# Patient Record
Sex: Male | Born: 1965 | Hispanic: Yes | Marital: Married | Smoking: Former smoker
Health system: Southern US, Community
[De-identification: ages and names within clinical notes are randomized; demographics above are authoritative.]

## PROBLEM LIST (undated history)

## (undated) ENCOUNTER — Emergency Department (HOSPITAL_COMMUNITY): Admission: EM | Payer: Self-pay | Source: Home / Self Care

## (undated) DIAGNOSIS — Z789 Other specified health status: Secondary | ICD-10-CM

## (undated) DIAGNOSIS — I639 Cerebral infarction, unspecified: Secondary | ICD-10-CM

## (undated) HISTORY — DX: Cerebral infarction, unspecified: I63.9

---

## 2016-03-11 ENCOUNTER — Other Ambulatory Visit (HOSPITAL_COMMUNITY): Payer: Self-pay | Admitting: Interventional Radiology

## 2016-03-11 ENCOUNTER — Inpatient Hospital Stay (HOSPITAL_COMMUNITY): Payer: Medicaid Other | Admitting: Certified Registered Nurse Anesthetist

## 2016-03-11 ENCOUNTER — Ambulatory Visit (HOSPITAL_COMMUNITY)
Admission: RE | Admit: 2016-03-11 | Discharge: 2016-03-11 | Disposition: A | Discharge status: Home / Self Care | Payer: Medicaid Other | Source: Ambulatory Visit | Attending: Interventional Radiology | Admitting: Interventional Radiology

## 2016-03-11 ENCOUNTER — Ambulatory Visit (HOSPITAL_COMMUNITY)
Admission: RE | Admit: 2016-03-11 | Discharge: 2016-03-11 | Disposition: A | Discharge status: Home / Self Care | Payer: Self-pay | Source: Ambulatory Visit | Attending: Interventional Radiology | Admitting: Interventional Radiology

## 2016-03-11 ENCOUNTER — Encounter (HOSPITAL_COMMUNITY)
Admission: EM | Disposition: A | Discharge status: Rehabilitation Facility | Payer: Self-pay | Source: Other Acute Inpatient Hospital | Attending: Neurology

## 2016-03-11 ENCOUNTER — Inpatient Hospital Stay (HOSPITAL_COMMUNITY)
Admission: EM | Admit: 2016-03-11 | Discharge: 2016-03-14 | DRG: 065 | Disposition: A | Discharge status: Rehabilitation Facility | Payer: Medicaid Other | Source: Other Acute Inpatient Hospital | Attending: Neurology | Admitting: Neurology

## 2016-03-11 ENCOUNTER — Inpatient Hospital Stay (HOSPITAL_COMMUNITY): Payer: Medicaid Other

## 2016-03-11 ENCOUNTER — Encounter (HOSPITAL_COMMUNITY): Payer: Self-pay | Admitting: Pulmonary Disease

## 2016-03-11 DIAGNOSIS — I6389 Other cerebral infarction: Secondary | ICD-10-CM

## 2016-03-11 DIAGNOSIS — I639 Cerebral infarction, unspecified: Secondary | ICD-10-CM

## 2016-03-11 DIAGNOSIS — I10 Essential (primary) hypertension: Secondary | ICD-10-CM | POA: Diagnosis present

## 2016-03-11 DIAGNOSIS — F17200 Nicotine dependence, unspecified, uncomplicated: Secondary | ICD-10-CM

## 2016-03-11 DIAGNOSIS — G8194 Hemiplegia, unspecified affecting left nondominant side: Secondary | ICD-10-CM | POA: Diagnosis not present

## 2016-03-11 DIAGNOSIS — I638 Other cerebral infarction: Secondary | ICD-10-CM

## 2016-03-11 DIAGNOSIS — J9690 Respiratory failure, unspecified, unspecified whether with hypoxia or hypercapnia: Secondary | ICD-10-CM

## 2016-03-11 DIAGNOSIS — E785 Hyperlipidemia, unspecified: Secondary | ICD-10-CM

## 2016-03-11 DIAGNOSIS — I63311 Cerebral infarction due to thrombosis of right middle cerebral artery: Secondary | ICD-10-CM

## 2016-03-11 DIAGNOSIS — R29707 NIHSS score 7: Secondary | ICD-10-CM | POA: Diagnosis present

## 2016-03-11 DIAGNOSIS — R1310 Dysphagia, unspecified: Secondary | ICD-10-CM | POA: Diagnosis present

## 2016-03-11 DIAGNOSIS — R739 Hyperglycemia, unspecified: Secondary | ICD-10-CM | POA: Diagnosis present

## 2016-03-11 DIAGNOSIS — J9601 Acute respiratory failure with hypoxia: Secondary | ICD-10-CM

## 2016-03-11 DIAGNOSIS — I63511 Cerebral infarction due to unspecified occlusion or stenosis of right middle cerebral artery: Principal | ICD-10-CM | POA: Diagnosis present

## 2016-03-11 DIAGNOSIS — H02402 Unspecified ptosis of left eyelid: Secondary | ICD-10-CM | POA: Diagnosis present

## 2016-03-11 DIAGNOSIS — F17210 Nicotine dependence, cigarettes, uncomplicated: Secondary | ICD-10-CM | POA: Diagnosis not present

## 2016-03-11 DIAGNOSIS — G4733 Obstructive sleep apnea (adult) (pediatric): Secondary | ICD-10-CM | POA: Diagnosis present

## 2016-03-11 DIAGNOSIS — R29810 Facial weakness: Secondary | ICD-10-CM | POA: Diagnosis present

## 2016-03-11 HISTORY — PX: RADIOLOGY WITH ANESTHESIA: SHX6223

## 2016-03-11 HISTORY — DX: Other specified health status: Z78.9

## 2016-03-11 HISTORY — PX: IR GENERIC HISTORICAL: IMG1180011

## 2016-03-11 LAB — BLOOD GAS, ARTERIAL
Acid-base deficit: 0.8 mmol/L (ref 0.0–2.0)
BICARBONATE: 23.1 mmol/L (ref 20.0–28.0)
DRAWN BY: 448981
FIO2: 1
LHR: 14 {breaths}/min
MECHVT: 510 mL
O2 Saturation: 99.8 %
PEEP: 5 cmH2O
PO2 ART: 394 mmHg — AB (ref 83.0–108.0)
Patient temperature: 97.8
pCO2 arterial: 35.7 mmHg (ref 32.0–48.0)
pH, Arterial: 7.424 (ref 7.350–7.450)

## 2016-03-11 LAB — BASIC METABOLIC PANEL
ANION GAP: 9 (ref 5–15)
Anion gap: 10 (ref 5–15)
BUN: 10 mg/dL (ref 6–20)
BUN: 9 mg/dL (ref 6–20)
CALCIUM: 8.8 mg/dL — AB (ref 8.9–10.3)
CHLORIDE: 104 mmol/L (ref 101–111)
CO2: 24 mmol/L (ref 22–32)
CO2: 23 mmol/L (ref 22–32)
CREATININE: 0.64 mg/dL (ref 0.61–1.24)
Calcium: 9.2 mg/dL (ref 8.9–10.3)
Chloride: 109 mmol/L (ref 101–111)
Creatinine, Ser: 0.67 mg/dL (ref 0.61–1.24)
GFR calc Af Amer: >60 mL/min (ref >60)
Glucose, Bld: 120 mg/dL — ABNORMAL HIGH (ref 65–99)
Glucose, Bld: 151 mg/dL — ABNORMAL HIGH (ref 65–99)
POTASSIUM: 3.8 mmol/L (ref 3.5–5.1)
POTASSIUM: 3.8 mmol/L (ref 3.5–5.1)
SODIUM: 138 mmol/L (ref 135–145)
SODIUM: 141 mmol/L (ref 135–145)

## 2016-03-11 LAB — CBC WITH DIFFERENTIAL/PLATELET
BASOS PCT: 0 %
Basophils Absolute: 0 10*3/uL (ref 0.0–0.1)
EOS ABS: 0.2 10*3/uL (ref 0.0–0.7)
EOS PCT: 2 %
HCT: 44.1 % (ref 39.0–52.0)
Hemoglobin: 14.8 g/dL (ref 13.0–17.0)
Lymphocytes Relative: 24 %
Lymphs Abs: 2.5 10*3/uL (ref 0.7–4.0)
MCH: 28.4 pg (ref 26.0–34.0)
MCHC: 33.6 g/dL (ref 30.0–36.0)
MCV: 84.6 fL (ref 78.0–100.0)
Monocytes Absolute: 0.4 10*3/uL (ref 0.1–1.0)
Monocytes Relative: 4 %
Neutro Abs: 7.3 10*3/uL (ref 1.7–7.7)
Neutrophils Relative %: 70 %
PLATELETS: 145 10*3/uL — AB (ref 150–400)
RBC: 5.21 MIL/uL (ref 4.22–5.81)
RDW: 13.3 % (ref 11.5–15.5)
WBC: 10.5 10*3/uL (ref 4.0–10.5)

## 2016-03-11 LAB — TRIGLYCERIDES: Triglycerides: 142 mg/dL (ref <150)

## 2016-03-11 LAB — PROTIME-INR
INR: 0.98
PROTHROMBIN TIME: 13 s (ref 11.4–15.2)

## 2016-03-11 LAB — GLUCOSE, CAPILLARY: Glucose-Capillary: 121 mg/dL — ABNORMAL HIGH (ref 65–99)

## 2016-03-11 LAB — MRSA PCR SCREENING: MRSA by PCR: NEGATIVE

## 2016-03-11 SURGERY — RADIOLOGY WITH ANESTHESIA
Anesthesia: General

## 2016-03-11 MED ORDER — PROPOFOL 10 MG/ML IV BOLUS
INTRAVENOUS | Status: DC | PRN
  Administered 2016-03-11: 120 mg via INTRAVENOUS

## 2016-03-11 MED ORDER — GLYCOPYRROLATE 0.2 MG/ML IJ SOLN
INTRAMUSCULAR | Status: DC | PRN
  Administered 2016-03-11: 0.2 mg via INTRAVENOUS

## 2016-03-11 MED ORDER — FAMOTIDINE IN NACL 20-0.9 MG/50ML-% IV SOLN
20.0000 mg | INTRAVENOUS | Status: DC
  Administered 2016-03-11 – 2016-03-12 (×2): 20 mg via INTRAVENOUS
  Filled 2016-03-11 (×2): qty 50

## 2016-03-11 MED ORDER — SUCCINYLCHOLINE CHLORIDE 20 MG/ML IJ SOLN
INTRAMUSCULAR | Status: DC | PRN
  Administered 2016-03-11: 100 mg via INTRAVENOUS

## 2016-03-11 MED ORDER — ROCURONIUM BROMIDE 100 MG/10ML IV SOLN
INTRAVENOUS | Status: DC | PRN
  Administered 2016-03-11: 10 mg via INTRAVENOUS
  Administered 2016-03-11 (×2): 20 mg via INTRAVENOUS
  Administered 2016-03-11: 50 mg via INTRAVENOUS
  Administered 2016-03-11: 30 mg via INTRAVENOUS

## 2016-03-11 MED ORDER — PROPOFOL 500 MG/50ML IV EMUL
INTRAVENOUS | Status: DC | PRN
  Administered 2016-03-11: 50 ug/kg/min via INTRAVENOUS

## 2016-03-11 MED ORDER — ACETAMINOPHEN 160 MG/5ML PO SOLN
650.0000 mg | ORAL | Status: DC | PRN
  Filled 2016-03-11: qty 20.3

## 2016-03-11 MED ORDER — PROPOFOL 1000 MG/100ML IV EMUL: 5.0000 ug/kg/min | INTRAVENOUS | Status: DC

## 2016-03-11 MED ORDER — CEFAZOLIN SODIUM-DEXTROSE 2-3 GM-% IV SOLR
INTRAVENOUS | Status: DC | PRN
  Administered 2016-03-11: 2 g via INTRAVENOUS

## 2016-03-11 MED ORDER — SODIUM CHLORIDE 0.9 % IV SOLN
INTRAVENOUS | Status: DC
  Administered 2016-03-11 – 2016-03-13 (×5): via INTRAVENOUS
  Administered 2016-03-14: 1000 mL via INTRAVENOUS

## 2016-03-11 MED ORDER — ACETAMINOPHEN 325 MG PO TABS
650.0000 mg | ORAL_TABLET | ORAL | Status: DC | PRN
  Filled 2016-03-11: qty 2

## 2016-03-11 MED ORDER — STROKE: EARLY STAGES OF RECOVERY BOOK
Freq: Once | Status: DC
  Filled 2016-03-11: qty 1

## 2016-03-11 MED ORDER — SODIUM CHLORIDE 0.9 % IV SOLN
0.0000 ug/min | INTRAVENOUS | Status: DC
  Filled 2016-03-11: qty 1

## 2016-03-11 MED ORDER — SODIUM CHLORIDE 0.9 % IV SOLN
INTRAVENOUS | Status: DC | PRN
  Administered 2016-03-11: 10:00:00 via INTRAVENOUS

## 2016-03-11 MED ORDER — ONDANSETRON HCL 4 MG/2ML IJ SOLN: 4.0000 mg | Freq: Four times a day (QID) | INTRAMUSCULAR | Status: DC | PRN

## 2016-03-11 MED ORDER — ORAL CARE MOUTH RINSE
15.0000 mL | Freq: Four times a day (QID) | OROMUCOSAL | Status: DC
Start: 2016-03-11 — End: 2016-03-11

## 2016-03-11 MED ORDER — ORAL CARE MOUTH RINSE
15.0000 mL | OROMUCOSAL | Status: DC
  Administered 2016-03-11: 15 mL via OROMUCOSAL

## 2016-03-11 MED ORDER — PROPOFOL 1000 MG/100ML IV EMUL
5.0000 ug/kg/min | INTRAVENOUS | Status: DC
  Administered 2016-03-11: 55 ug/kg/min via INTRAVENOUS

## 2016-03-11 MED ORDER — EPHEDRINE SULFATE 50 MG/ML IJ SOLN
INTRAMUSCULAR | Status: DC | PRN
  Administered 2016-03-11: 15 mg via INTRAVENOUS
  Administered 2016-03-11: 5 mg via INTRAVENOUS
  Administered 2016-03-11: 10 mg via INTRAVENOUS
  Administered 2016-03-11: 5 mg via INTRAVENOUS

## 2016-03-11 MED ORDER — NICARDIPINE HCL IN NACL 20-0.86 MG/200ML-% IV SOLN: 0.0000 mg/h | INTRAVENOUS | Status: DC

## 2016-03-11 MED ORDER — ACETAMINOPHEN 80 MG RE SUPP
650.0000 mg | RECTAL | Status: DC | PRN
  Filled 2016-03-11: qty 8

## 2016-03-11 MED ORDER — PHENYLEPHRINE HCL 10 MG/ML IJ SOLN
INTRAMUSCULAR | Status: DC | PRN
  Administered 2016-03-11 (×2): 120 ug via INTRAVENOUS

## 2016-03-11 MED ORDER — CHLORHEXIDINE GLUCONATE 0.12% ORAL RINSE (MEDLINE KIT): 15.0000 mL | Freq: Two times a day (BID) | OROMUCOSAL | Status: DC

## 2016-03-11 MED ORDER — DEXTROSE 5 % IV SOLN
INTRAVENOUS | Status: DC | PRN
  Administered 2016-03-11: 40 ug/min via INTRAVENOUS

## 2016-03-11 MED ORDER — FENTANYL CITRATE (PF) 100 MCG/2ML IJ SOLN
INTRAMUSCULAR | Status: DC | PRN
  Administered 2016-03-11 (×4): 50 ug via INTRAVENOUS

## 2016-03-11 MED ORDER — FENTANYL CITRATE (PF) 100 MCG/2ML IJ SOLN
INTRAMUSCULAR | Status: AC
Start: 2016-03-11 — End: 2016-03-11
  Filled 2016-03-11: qty 4

## 2016-03-11 MED ORDER — LIDOCAINE HCL (CARDIAC) 20 MG/ML IV SOLN
INTRAVENOUS | Status: DC | PRN
  Administered 2016-03-11: 60 mg via INTRAVENOUS

## 2016-03-11 MED ORDER — FENTANYL CITRATE (PF) 100 MCG/2ML IJ SOLN
INTRAMUSCULAR | Status: AC
  Filled 2016-03-11: qty 2

## 2016-03-11 MED ORDER — NICARDIPINE HCL IN NACL 20-0.86 MG/200ML-% IV SOLN
3.0000 mg/h | INTRAVENOUS | Status: DC
  Filled 2016-03-11: qty 200

## 2016-03-11 MED ORDER — IOPAMIDOL (ISOVUE-300) INJECTION 61%
INTRAVENOUS | Status: AC
  Filled 2016-03-11: qty 150

## 2016-03-11 MED ORDER — IPRATROPIUM-ALBUTEROL 0.5-2.5 (3) MG/3ML IN SOLN: 3.0000 mL | RESPIRATORY_TRACT | Status: DC | PRN

## 2016-03-11 MED ORDER — IOPAMIDOL (ISOVUE-300) INJECTION 61%
INTRAVENOUS | Status: AC
  Filled 2016-03-11: qty 50

## 2016-03-11 MED ORDER — CEFAZOLIN SODIUM-DEXTROSE 2-4 GM/100ML-% IV SOLN
INTRAVENOUS | Status: AC
  Filled 2016-03-11: qty 100

## 2016-03-11 NOTE — Progress Notes (Signed)
Pt at RN station talking with MD Loreta AveWagner about proceeding with stroke intervention, interpreter explains to pt via MD info and pt agrees to proceed; family not here at this time

## 2016-03-11 NOTE — Progress Notes (Signed)
Pt taken to PACU awaiting Neuro ICU bed. RN checked groin site and pulses with IR RN; groin level 0.; pulses strong; IR team signing off.

## 2016-03-11 NOTE — Progress Notes (Signed)
Pt not opening eyes or moving left side. CCM and neurology nofified.

## 2016-03-11 NOTE — H&P (Signed)
H&P    Chief Complaint: stroke  History obtained from:   Chart    HPI:                                                                                                                                         Grant Garcia is an 51 y.o. male who is Spanish-speaking only.  Hx obtained from staff as no family present.  Went to bed feeling weak all over but no focal findings.  Woke up this morning and noticed   Date last known well: Date: 03/10/2016 Time last known well: Time: 22:00 tPA Given: No: out of window    No past medical history on file.  No past surgical history on file.  No family history on file. Social History:  has no tobacco, alcohol, and drug history on file.  Allergies: Allergies not on file  Medications:                                                                                                                           Current Facility-Administered Medications  Medication Dose Route Frequency Provider Last Rate Last Dose  .  stroke: mapping our early stages of recovery book   Does not apply Once Ulice Dash, PA-C      . 0.9 %  sodium chloride infusion   Intravenous Continuous Ulice Dash, PA-C      . acetaminophen (TYLENOL) tablet 650 mg  650 mg Oral Q4H PRN Ulice Dash, PA-C       Or  . acetaminophen (TYLENOL) solution 650 mg  650 mg Per Tube Q4H PRN Ulice Dash, PA-C       Or  . acetaminophen (TYLENOL) suppository 640 mg  640 mg Rectal Q4H PRN Ulice Dash, PA-C       No current outpatient prescriptions on file.   Facility-Administered Medications Ordered in Other Encounters  Medication Dose Route Frequency Provider Last Rate Last Dose  . fentaNYL (SUBLIMAZE) 100 MCG/2ML injection           . iopamidol (ISOVUE-300) 61 % injection              ROS:  History could not be obtained from the patient due to  language barrier   Neurologic Examination:                                                                                                      There were no vitals taken for this visit.  HEENT-  Normocephalic, no lesions, without obvious abnormality.  Normal external eye and conjunctiva.  Normal TM's bilaterally.  Normal auditory canals and external ears. Normal external nose, mucus membranes and septum.  Normal pharynx. Cardiovascular- S1, S2 normal, pulses palpable throughout   Lungs- chest clear, no wheezing, rales, normal symmetric air entry Abdomen- normal findings: bowel sounds normal Extremities- no edema Lymph-no adenopathy palpable Musculoskeletal-no joint tenderness, deformity or swelling Skin-warm and dry, no hyperpigmentation, vitiligo, or suspicious lesions  Neurological Examination Mental Status: Alert, conversant through interpreter, follows commands  Cranial Nerves: II: Discs flat bilaterally; Visual fields grossly normal,  III,IV, VI: ptosis not present, extra-ocular motions intact bilaterally, pupils equal, round, reactive to light and accommodation V,VII: smile symmetric, facial light touch sensation normal bilaterally VIII: hearing normal bilaterally IX,X: uvula rises symmetrically XI: bilateral shoulder shrug XII: midline tongue extension Motor: Right : Upper extremity   5/5    Left:     Upper extremity   5/5  Lower extremity   5/5     Lower extremity   5/5 Tone and bulk:normal tone throughout; no atrophy noted Sensory: Pinprick and light touch intact throughout, bilaterally Deep Tendon Reflexes: 2+ and symmetric throughout Plantars: Right: downgoing   Left: downgoing Cerebellar: normal finger-to-nose, normal rapid alternating movements and normal heel-to-shin test Gait: not tested  Lab Results: Basic Metabolic Panel: No results for input(s): NA, K, CL, CO2, GLUCOSE, BUN, CREATININE, CALCIUM, MG, PHOS in the last 168 hours.  Liver Function Tests: No  results for input(s): AST, ALT, ALKPHOS, BILITOT, PROT, ALBUMIN in the last 168 hours. No results for input(s): LIPASE, AMYLASE in the last 168 hours. No results for input(s): AMMONIA in the last 168 hours.  CBC:  Recent Labs Lab 03/11/16 0920  WBC 10.5  NEUTROABS 7.3  HGB 14.8  HCT 44.1  MCV 84.6  PLT 145*    Cardiac Enzymes: No results for input(s): CKTOTAL, CKMB, CKMBINDEX, TROPONINI in the last 168 hours.  Lipid Panel: No results for input(s): CHOL, TRIG, HDL, CHOLHDL, VLDL, LDLCALC in the last 168 hours.  CBG: No results for input(s): GLUCAP in the last 168 hours.  Microbiology: No results found for this or any previous visit.  Coagulation Studies: No results for input(s): LABPROT, INR in the last 72 hours.  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 Initial encounter. EXAM: CT PERFUSION BRAIN TECHNIQUE: Multiphase CT imaging of the brain was performed following IV bolus contrast injection. Subsequent parametric perfusion maps were calculated using RAPID software. CONTRAST:  50 mL Isovue 370 COMPARISON:  CTA head and neck  performed at 0640 hours today. Head CTs earlier today. FINDINGS: CT Brain Perfusion Findings: CBF (<30%) Volume:  0 mL Perfusion (Tmax>6.0s) volume: 118 mL mL, corresponding to the right MCA territory Mismatch Volume:  " Infinite" Infarction Location:Noncontrast head CT suggests there is a small core infarct in the right lentiform nuclei on this patient, but this is not detected using the perfusion thresholds for this exam. IMPRESSION: Large area of right MCA territory penumbra. Clinically suspected small volume core infarct in the right lentiform not detected. Overall very favorable CTP characteristics for endovascular reperfusion. I am advised that they are prepping  for Neurointervention at the time of this dictation. Electronically Signed   By: Odessa Fleming M.D.   On: 03/11/2016 09:27   Ct Head Code Stroke Wo Contrast`  Result Date: 03/11/2016 CLINICAL DATA:  Code stroke. 51 year old male with right MCA M1 occlusion on CTA head and neck for evaluation of 2 days of left side weakness. Initial encounter. EXAM: CT HEAD WITHOUT CONTRAST TECHNIQUE: Contiguous axial images were obtained from the base of the skull through the vertex without intravenous contrast. COMPARISON:  CTA 0640 hours today. Head CT without contrast 0544 hours today. FINDINGS: Brain: There is stable hypodensity in the right lentiform nuclei, but otherwise no cytotoxic edema is evident in the right MCA territory. Gray-white matter differentiation appears stable No acute intracranial hemorrhage identified. No midline shift, mass effect, or evidence of intracranial mass lesion. No ventriculomegaly. Vascular: Mild residual intravascular contrast. Skull: Previous right frontotemporal craniotomy. No acute osseous abnormality identified. Sinuses/Orbits: Stable. Well pneumatized in general. Chronic left lamina papyracea fracture. Other: No acute orbit or scalp soft tissue findings. ASPECTS ([place] Stroke Program Early CT Score) - Ganglionic level infarction (caudate, lentiform nuclei, internal capsule, insula, M1-M3 cortex): 6 (minus 1 for L) - Supraganglionic infarction (M4-M6 cortex): 3 Total score (0-10 with 10 being normal): 9 IMPRESSION: 1. Stable noncontrast CT appearance of the brain since 0544 hours today: Hypodensity in the right lentiform nucleus, but no other CT changes of right MCA infarct. No acute intracranial hemorrhage. 2. ASPECTS is 9. 3. I was advised that Dr. Ruthy Dick was discussing the findings on this study with Neurology at 0916 hours. Electronically Signed   By: Odessa Fleming M.D.   On: 03/11/2016 09:22       Assessment and plan discussed with with attending physician and they are in  agreement.    Felicie Morn PA-C [facility] [phone]  03/11/2016, 9:32 AM   Assessment: 51 y.o. male with apparent right M1 occlusion vs chronic stenosis.  Will send for angio and thrombectomy evaluation.  Patient will then be admitted for further evaluation and management by the stroke service.  Stroke Risk Factors - none  Dr. Benedict Needy [facility] [phone]  03/11/2016, 2:40 PM

## 2016-03-11 NOTE — Procedures (Signed)
Neuro-Interventional Radiology Post Cerebral Angiogram Procedure Note  History:  Grant Garcia is an 51 y.o. male [facility] with right MCA ELVO on imaging workup.   Imaging shows ASPECTS 10, with CT perfusion showing no core infarct and decrease in the MTT to the right hemisphere.   Date last known well: Date: 03/10/2016 Time last known well: Time: 22:00 tPA Given: No: out of window  Baseline mRS:  0 NIHSS:   7  ASPECTS:   10 Anesthesia    GETA Skin Puncture:   9:50am  Initial flow: TICI 1, with excellent collateral flow.   First Pass Date & Time: 10:25, Penumbra ACE aspiration with Solitaire Rolm Gala(Solumbra).  Post: TICI 1.  Second Pass: `  10:50, Penumbra ACE aspiration with Solitaire Rolm Gala(Solumbra). Post: TICI 1.   Proximal or Distal:  Proximal MCA occlusion. Post TICI Score:  TICI 1.   EBL: ~50cc Specimen: none Complication: None  Procedure:  Right CFA puncture.  Cerebral angiogram. Attempt at revascularization/thrombectomy of right MCA occlusion, with 2 passes of Solumbra technique.   Manual pressure for closure of the right CFA.   Findings:   Initial angio shows occlusion of the proximal MCA.  Excellent collateral flow filling the MCA territory via the ACA.   No significant improvement in flow through the occlusion, with maintained collateral flow perfusing the MCA territory.   Final flow via the occlusion, TICI 1, unchanged.   Given the behavior of the site of occlusion, suspect chronic atherosclerotic plaque/narrowing with development of collateral flow.   Recommendations:  To CT for head Ct. To PACU for recovery. Maintain SBP at ~140SBP.   To ICU after PACU.  Pressure dressing to the right CFA puncture site.  Right leg straight x 6 hours for assisted hemostasis. Frequent neurovascular checks.   Signed,  Yvone NeuJaime S. Loreta AveWagner, DO

## 2016-03-11 NOTE — Anesthesia Procedure Notes (Addendum)
Procedure Name: Intubation Date/Time: 03/11/2016 9:43 AM Performed by: Barrington Ellison Pre-anesthesia Checklist: Patient identified, Emergency Drugs available, Suction available and Patient being monitored Patient Re-evaluated:Patient Re-evaluated prior to inductionOxygen Delivery Method: Circle System Utilized Preoxygenation: Pre-oxygenation with 100% oxygen Intubation Type: IV induction, Rapid sequence and Cricoid Pressure applied Laryngoscope Size: Mac and 3 Grade View: Grade I Tube type: Subglottic suction tube Tube size: 7.5 mm Number of attempts: 1 Airway Equipment and Method: Stylet and Oral airway Placement Confirmation: ETT inserted through vocal cords under direct vision,  positive ETCO2 and breath sounds checked- equal and bilateral Secured at: 22 cm Tube secured with: Tape Dental Injury: Teeth and Oropharynx as per pre-operative assessment

## 2016-03-11 NOTE — Progress Notes (Signed)
Pt unable to keep rt leg straight, writhing mitten off reaching for ETT. MD called to request restraints.

## 2016-03-11 NOTE — Progress Notes (Signed)
Called to CT from IR to set up vent for patient. Pt placed on vent for scan and then brought to PACU with CRNA manually bagging patient. Placed back on vent in PACU. Awaiting further orders.

## 2016-03-11 NOTE — Transfer of Care (Signed)
Immediate Anesthesia Transfer of Care Note  Patient: Grant Garcia  Procedure(s) Performed: Procedure(s): RADIOLOGY WITH ANESTHESIA (N/A)  Patient Location: PACU  Anesthesia Type:General  Level of Consciousness: Patient remains intubated per anesthesia plan  Airway & Oxygen Therapy: Patient remains intubated per anesthesia plan and Patient placed on Ventilator (see vital sign flow sheet for setting)  Post-op Assessment: Report given to RN  Post vital signs: Reviewed and stable  Last Vitals: There were no vitals filed for this visit.  Last Pain: There were no vitals filed for this visit.       Complications: No apparent anesthesia complications

## 2016-03-11 NOTE — Consult Note (Signed)
PULMONARY / CRITICAL CARE MEDICINE   Name: Gerry Blanchfield MRN: [id number] DOB: [birth year]    ADMISSION DATE:  03/11/2016 CONSULTATION DATE:  03/11/16  REFERRING MD:  Dr. Benedict Needy  CHIEF COMPLAINT:  Left sided weakness   HISTORY OF PRESENT ILLNESS:  [name], current smoker (1.5 - 2ppd x 30 years) with no PMH who transferred to [facility] on 2/14 from [facility] ER with worsening left sided weakness.    He was evaluated at [facility] with a CT of the head that was concerning for emergent large vessel occlusion of the right MCA.  He was transferred to [facility] as a CODE STROKE.  Perfusion study on performed with a repeat non-contrast CT of the head which showed a large area of right MCA territory penumbra.  The patient was taken to Neuro IR for thrombectomy.  Cerebral angiogram showed occlusion of the proximal MCA, excellent collateral flow filling the MCA territory via the ACA.  The patient was returned to ICU on mechanical ventilation for recovery.    PCCM consulted for evaluation / ICU assistance.    PAST MEDICAL HISTORY :  He  has no past medical history on file.  PAST SURGICAL HISTORY: He  has no past surgical history on file.  No Known Allergies  No current facility-administered medications on file prior to encounter.    No current outpatient prescriptions on file prior to encounter.    FAMILY HISTORY:  His has no family status information on file.    SOCIAL HISTORY: He  reports that he has been smoking Cigarettes.  He has a 60.00 pack-year smoking history. He does not have any smokeless tobacco history on file.  REVIEW OF SYSTEMS:   Unable to complete as patient is altered on mechanical ventilation.   SUBJECTIVE:  RN reports movement on right side, no follow commands and no eye opening  VITAL SIGNS: BP 132/62   Pulse 79   Temp 97.8 F (36.6 C)   Resp 14   Ht 5\' 6"  (1.676 m)   Wt 164 lb 10.9 oz (74.7 kg)   SpO2 100%   BMI 26.58 kg/m   HEMODYNAMICS:     VENTILATOR SETTINGS: Vent Mode: PRVC FiO2 (%):  [100 %] 100 % Set Rate:  [14 bmp] 14 bmp Vt Set:  [550 mL] 550 mL PEEP:  [5 cmH20] 5 cmH20 Plateau Pressure:  [15 cmH20] 15 cmH20  INTAKE / OUTPUT: No intake/output data recorded.  PHYSICAL EXAMINATION: General:  Thin adult male in NAD on vent, intermittent agitation Neuro:  Awakens, follows commands on right, 5/5 strength on R, L side wiggles toes to command, weakness on left, opens eyes to commands, + gag  HEENT:  ETT, MM pink/moist, no jvd Cardiovascular:  s1s2 rrr, no m/r/g Lungs:  Even/non-labored, lungs bilaterally coarse  Abdomen:  Soft, non-tender, bsx4 active  Musculoskeletal:  No acute deformities  Skin:  Warm/dry, no edema   LABS:  BMET  Recent Labs Lab 03/11/16 0920  NA 138  K 3.8  CL 104  CO2 24  BUN 10  CREATININE 0.64  GLUCOSE 120*    Electrolytes  Recent Labs Lab 03/11/16 0920  CALCIUM 9.2    CBC  Recent Labs Lab 03/11/16 0920  WBC 10.5  HGB 14.8  HCT 44.1  PLT 145*    Coag's  Recent Labs Lab 03/11/16 0920  INR 0.98    Sepsis Markers No results for input(s): LATICACIDVEN, PROCALCITON, O2SATVEN in the last 168 hours.  ABG No results for  input(s): PHART, PCO2ART, PO2ART in the last 168 hours.  Liver Enzymes No results for input(s): AST, ALT, ALKPHOS, BILITOT, ALBUMIN in the last 168 hours.  Cardiac Enzymes No results for input(s): TROPONINI, PROBNP in the last 168 hours.  Glucose  Recent Labs Lab 03/11/16 1228  GLUCAP 121*    Imaging Ct Cerebral Perfusion W Contrast  Result Date: 03/11/2016 CLINICAL DATA:  51 year old male with right MCA M1 occlusion on CTA and only small lacunar type infarct in the right lentiform evident by CT since 05/1938 4 hours today. Initial encounter. EXAM: CT PERFUSION BRAIN TECHNIQUE: Multiphase CT imaging of the brain was performed following IV bolus contrast injection. Subsequent parametric perfusion maps were calculated using RAPID  software. CONTRAST:  50 mL Isovue 370 COMPARISON:  CTA head and neck performed at 0640 hours today. Head CTs earlier today. FINDINGS: CT Brain Perfusion Findings: CBF (<30%) Volume:  0 mL Perfusion (Tmax>6.0s) volume: 118 mL mL, corresponding to the right MCA territory Mismatch Volume:  " Infinite" Infarction Location:Noncontrast head CT suggests there is a small core infarct in the right lentiform nuclei on this patient, but this is not detected using the perfusion thresholds for this exam. IMPRESSION: Large area of right MCA territory penumbra. Clinically suspected small volume core infarct in the right lentiform not detected. Overall very favorable CTP characteristics for endovascular reperfusion. I am advised that they are prepping for Neurointervention at the time of this dictation. Electronically Signed   By: Odessa Fleming M.D.   On: 03/11/2016 09:27   Ct Head Code Stroke Wo Contrast`  Result Date: 03/11/2016 CLINICAL DATA:  Code stroke. 51 year old male with right MCA M1 occlusion on CTA head and neck for evaluation of 2 days of left side weakness. Initial encounter. EXAM: CT HEAD WITHOUT CONTRAST TECHNIQUE: Contiguous axial images were obtained from the base of the skull through the vertex without intravenous contrast. COMPARISON:  CTA 0640 hours today. Head CT without contrast 0544 hours today. FINDINGS: Brain: There is stable hypodensity in the right lentiform nuclei, but otherwise no cytotoxic edema is evident in the right MCA territory. Gray-white matter differentiation appears stable No acute intracranial hemorrhage identified. No midline shift, mass effect, or evidence of intracranial mass lesion. No ventriculomegaly. Vascular: Mild residual intravascular contrast. Skull: Previous right frontotemporal craniotomy. No acute osseous abnormality identified. Sinuses/Orbits: Stable. Well pneumatized in general. Chronic left lamina papyracea fracture. Other: No acute orbit or scalp soft tissue findings. ASPECTS  (Alberta Stroke Program Early CT Score) - Ganglionic level infarction (caudate, lentiform nuclei, internal capsule, insula, M1-M3 cortex): 6 (minus 1 for L) - Supraganglionic infarction (M4-M6 cortex): 3 Total score (0-10 with 10 being normal): 9 IMPRESSION: 1. Stable noncontrast CT appearance of the brain since 0544 hours today: Hypodensity in the right lentiform nucleus, but no other CT changes of right MCA infarct. No acute intracranial hemorrhage. 2. ASPECTS is 9. 3. I was advised that Dr. Ruthy Dick was discussing the findings on this study with Neurology at 0916 hours. Electronically Signed   By: Odessa Fleming M.D.   On: 03/11/2016 09:22     STUDIES:  CT Head [facility]) 2/14 >> concern for emergent large vessel occlusion CT Cerebral Perfusion 2/14 >> large area of right MCA territory penumbra CT Head w/o 2/14 >>   CULTURES:   ANTIBIOTICS: Ancef 2/14 >>   SIGNIFICANT EVENTS: 2/14  Admit with progressive L sided weakness, concern for emergent large vessel occlusion, to Neuro IR  LINES/TUBES: ETT 2/14 >>   DISCUSSION: 50  y/o M admitted 2/14 with progressive left sided weakness, concern for emergent large vessel occlusion and taken to neuro IR, returned to ICU on mechanical ventilation.   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 P:   RASS goal: -1  Propofol for sedation  Bed rest x 6 hours, monitor sheath site Defer further neuro-imaging to Neurology / IR   PULMONARY A: Acute Respiratory Failure in setting of neurologic dysfunction  P:   PRVC 8 cc/kg  Wean PEEP / FiO2 for sats > 92% CXR post arrival to ICU No extubation until sheath removed  SBT/WUA in am   CARDIOVASCULAR A:  Mild Hypertension - agitation associated P:  SBP Goal ~ 140  ICU monitoring of hemodynamics Cardene gtt for SBP goal  Assess lipid panel May need neo to support BP goal with propofol   RENAL A:   No acute issues  P:   Trend BMP / UOP  Replace electrolytes as  indicated   GASTROINTESTINAL A:   At Risk Aspiration - in setting of concern for R MCA CVA P:   NPO  Place OGT  Consider TF in am if remains intubated  HEMATOLOGIC A:   No acute issues  P:  Trend CBC  SCD's for DVT prophylaxis   INFECTIOUS A:   Post-Op Neuro IR P:   Ancef Monitor WBC trend / fever curve  ENDOCRINE A:   Hyperglycemia - mild, no hx DM  P:   Monitor glucose on BMP     FAMILY  - Updates: Wife, daughter and niece updated at bedside.   - Inter-disciplinary family meet or Palliative Care meeting due by:  12/21   CC Time: 30 minutes  Canary Brim, NP-C [facility] Pgr: [phone] or if no answer [phone] 03/11/2016, 3:18 PM  ATTENDING NOTE / ATTESTATION NOTE :   I have discussed the case with the resident/APP  Canary Brim NP.   I agree with the resident/APP's  history, physical examination, assessment, and plans.    I have edited the above note and modified it according to our agreed history, physical examination, assessment and plan.   Briefly, [name], current smoker (1.5 - 2ppd x 30 years) with no PMH who transferred to [facility] on 2/14 from [facility] ER with worsening left sided weakness.   He was evaluated at [facility] with a CT of the head that was concerning for emergent large vessel occlusion of the right MCA.  He was transferred to [facility] as a CODE STROKE.  Perfusion study on performed with a repeat non-contrast CT of the head which showed a large area of right MCA territory penumbra.  The patient was taken to Neuro IR for thrombectomy.  Cerebral angiogram showed occlusion of the proximal MCA, excellent collateral flow filling the MCA territory via the ACA.  The patient was returned to ICU on mechanical ventilation for recovery.    Currently, pt is intubated, doing fair on PST.  He needs to lay flat until 530pm. Follows commands.   Vitals:  Vitals:   03/11/16 1345 03/11/16 1400 03/11/16 1517 03/11/16 1536  BP:  (!) 118/59 132/62  123/68  Pulse: 76 79  65  Resp: (!) 23 14  14   Temp:   97.8 F (36.6 C)   TempSrc:      SpO2: 100% 100%  100%  Weight:      Height:        Constitutional/General:  well-nourished, well-developed, intubated, not in any distress. Doing 500-600 TV on PST 10/5.   Body mass index is 26.58 kg/m. Wt Readings from Last 3 Encounters:  03/11/16 74.7 kg (164 lb 10.9 oz)    HEENT: PERLA, anicteric sclerae. (-) Oral thrush. Intubated, ETT in place  Neck: No masses. Midline trachea. No JVD, (-) LAD. (-) bruits appreciated.  Respiratory/Chest: Grossly normal chest. (-) deformity. (-) Accessory muscle use.  Symmetric expansion. Diminished BS on both lower lung zones. (-) wheezing, crackles, rhonchi (-) egophony  Cardiovascular: Regular rate and  rhythm, heart sounds normal, no murmur or gallops,  (-)  peripheral edema  Gastrointestinal:  Normal bowel sounds. Soft, non-tender. No hepatosplenomegaly.  (-) masses.   Musculoskeletal:  Normal muscle tone.   Extremities: Grossly normal. (-) clubbing, cyanosis.  (-) edema  Skin: (-) rash,lesions seen.   Neurological/Psychiatric : sedated, intubated. CN grossly intact. (-) lateralizing signs.   CBC Recent Labs     03/11/16  0920  WBC  10.5  HGB  14.8  HCT  44.1  PLT  145*    Coag's Recent Labs     03/11/16  0920  INR  0.98    BMET Recent Labs     03/11/16  0920  NA  138  K  3.8  CL  104  CO2  24  BUN  10  CREATININE  0.64  GLUCOSE  120*    Electrolytes Recent Labs     03/11/16  0920  CALCIUM  9.2    Sepsis Markers No results for input(s): PROCALCITON, O2SATVEN in the last 72 hours.  Invalid input(s): LACTICACIDVEN  ABG Recent Labs     03/11/16  1430  PHART  7.424  PCO2ART  35.7  PO2ART  394*    Liver Enzymes No results for input(s): AST, ALT, ALKPHOS, BILITOT, ALBUMIN in the last 72 hours.  Cardiac Enzymes No results for input(s): TROPONINI, PROBNP in the last 72  hours.  Glucose Recent Labs     03/11/16  1228  GLUCAP  121*    Imaging Ct Head Wo Contrast  Result Date: 03/11/2016 CLINICAL DATA:  Status post mechanical thrombectomy. EXAM: CT HEAD WITHOUT CONTRAST TECHNIQUE: Contiguous axial images were obtained from the base of the skull through the vertex without intravenous contrast. COMPARISON:  Head CT from earlier today FINDINGS: Brain: Stable perforator type infarct in the right basal ganglia and internal capsule. No evidence of infarct progression. No acute hemorrhage. No hydrocephalus or mass. Vascular: Reason intravenous contrast. No detectable asymmetric enhancement. Skull: Remote craniotomy on the right for hematoma evacuation. Sinuses/Orbits: Nasopharyngeal fluid in the setting of intubation. IMPRESSION: Stable postprocedural CT. No progression of the lenticulostriate infarct and no acute hemorrhage. Electronically Signed   By: Marnee Spring M.D.   On: 03/11/2016 12:41   Ir US Guide Vasc Access Right  Result Date: 03/11/2016 INDICATION: 51 year old male presenting with acute left-sided symptoms and imaging evidence of right MCA emergent large vessel occlusion. CT imaging demonstrates normal aspects score, with CT perfusion shadowing elevated mean transit time with potential tissue at risk and no core infarct. EXAM: IR PERCUTANEOUS ART THORMBECTOMY/INFUSION INTRACRANIAL INCLUDE DIAG ANGIO; IR ANGIO VERTEBRAL SEL VERTEBRAL UNI RIGHT MOD SED; IR ANGIO INTRA EXTRACRAN SEL COM CAROTID INNOMINATE UNI LEFT MOD SED; IR ULTRASOUND GUIDANCE VASC ACCESS RIGHT COMPARISON:  No prior CT angiogram MEDICATIONS: 2.0 g Ancef. The antibiotic was administered within 1 hour of the procedure ANESTHESIA/SEDATION: Anesthesia team provided general anesthesia CONTRAST:  160 cc Isovue-300 FLUOROSCOPY  TIME:  Fluoroscopy Time: 24 minutes 36 seconds (533 mGy). COMPLICATIONS: None TECHNIQUE: Informed written consent was obtained from the patient and the patient's family after a  thorough discussion of the procedural risks, benefits and alternatives. Specific risks discussed include: Bleeding, infection, contrast reaction, kidney injury/failure, need for further procedure/surgery, arterial injury or dissection, embolization to new territory, intracranial hemorrhage (10-15% risk), neurologic deterioration, cardiopulmonary collapse, death. All questions were addressed. Maximal Sterile Barrier Technique was utilized including during the procedure including caps, mask, sterile gowns, sterile gloves, sterile drape, hand hygiene and skin antiseptic. A timeout was performed prior to the initiation of the procedure. Ultrasound survey of the right inguinal region was performed with images stored and sent to PACs. 11 blade scalpel was used to make a small incision. A micropuncture needle was used access the right common femoral artery under ultrasound. With excellent arterial blood flow returned, an .018 micro wire was passed through the needle, observed to enter the abdominal aorta under fluoroscopy. The needle was removed, and a micropuncture sheath was placed over the wire. The inner dilator and wire were removed, and an 035 Bentson wire was advanced under fluoroscopy into the abdominal aorta. The sheath was removed and a standard 5 [place] vascular sheath was placed. The dilator was removed and the sheath was flushed. A 63F JB-1 diagnostic catheter was advanced over the wire to the proximal descending thoracic aorta. Wire was then removed. Double flush of the catheter was performed. Catheter was then used to select the innominate artery. Angiogram was performed. Using roadmap technique, the catheter was advanced over a roadrunner wire into cervical ICA. Formal angiogram was performed. Exchange length Rosen wire was then passed through the diagnostic catheter to the distal cervical ICA and the diagnostic catheter was removed. The 5 French sheath was removed and exchanged for 8 French 55 centimeter  BrightTip sheath. Sheath was flushed and attached to pressurized and heparinized saline bag for constant forward flow. Then a neuron Max 85cm catheter was prepared on the back table. Ace 68 intermediate catheter was then loaded though the Neuron Max catheter and advanced over the [facility] wire to the internal carotid artery. Wire was removed, and roadmap angiogram was performed. Microcatheter system was then introduced through the Ace catheter using a synchro soft 014 wire and a Trevo Provue18 catheter. Microcatheter system was advanced into the internal carotid artery, to the level of the occlusion. The micro wire was then carefully advanced through the occluded segment. Microcatheter was then push through the occluded segment and the wire was removed. Ace catheter was advanced over the micro wire to the ophthalmic ICA segment. Blood was then aspirated through the hub of the microcatheter, and a gentle contrast injection was performed confirming intraluminal position. A rotating hemostatic valve was then attached to the back end of the microcatheter, and a pressurized and heparinized saline bag was attached to the catheter. 4 x 40 solitaire device was then selected. Back flush was achieved at the rotating hemostatic valve, and then the device was gently advanced through the microcatheter to the distal end. The retriever was then unsheathed by withdrawing the microcatheter under fluoroscopy. Once the retriever was completely unsheathed, control angiogram was performed from the balloon catheter. Constant aspiration was then performed through the Ace catheter as the retriever was gently and slowly withdrawn with fluoroscopic observation. Once the retriever was entirely removed from the system, free aspiration was confirmed at the hub of the intermediate catheter, with free blood return confirmed. Control angiogram was then  performed. Persistent occlusion at the proximal MCA was confirmed. The microcatheter system was  then advanced through the Ace catheter. On the 2nd attempt, a lateral view was required in order to guide the microcatheter system beyond the ophthalmic artery origin. Once the micro wire microcatheter were beyond the occlusion, the micro wire was removed and solitaire 4 x 40 device was deployed. After the solitaire was deployed across the occlusion, the Ace catheter was advanced to the M1 segment at the site of the occlusion. Local aspiration was performed upon withdrawal of the solitaire device under fluoroscopic observation. Once the retrieve her was entirely removed from the system, free aspiration was confirmed at the hub of the intermediate catheter, with free blood return confirmed. Control angiogram was again performed. Review of the available imaging was performed at this time. The intermediate catheter was removed and Neuro Max catheter were removed. Diagnostic imaging was performed at the right vert origin. Angiogram of the left carotid system was also performed including intracranial images. Catheter was removed, and the bright tip sheath was exchanged for a short 8 French sheath at the right common femoral artery. Control angiogram was performed at the right common femoral artery puncture site. After the angiogram, right common femoral sheath was removed with deployment of Exoseal device. Patient tolerated the procedure well and remained hemodynamically stable throughout. No complications were encountered. Estimated blood loss approximately 50 cc. FINDINGS: Baseline angiogram Right common carotid artery:  Normal course caliber and contour. Right external carotid artery: Patent with antegrade flow. Significant flow through the superficial temporal branch, potentially contributing to collateral flow intracranial. Right internal carotid artery: Normal course caliber and contour of the cervical portion. Vertical and petrous segment patent with normal course caliber contour. Cavernous segment patent. Clinoid  segment patent. Antegrade flow of the ophthalmic artery. Ophthalmic segment patent. Terminus patent. Right MCA: Proximal occlusion of the middle cerebral artery. There is excellent collateral flow to the right hemisphere VA anterior cerebral artery. Right ACA: A 1 segment patent. A 2 segment perfuses the right territory. Baseline perfusion TICI 1, with excellent collateral perfusion. After the 1st pass, there is no significant improvement through the occlusion, with persistent TICI 1. After the 2nd pass, there is very minimal improvement in flow through the occluded segment, with persisting TICI 1 flow, and excellent collateral flow maintained. Vertebral origin injection demonstrates normal course contour and caliber of the right vertebral artery, with patent V3 segment. Cross flow from the left vertebral artery with partial washout in the basilar artery. Patent posterior inferior cerebellar artery, anterior inferior cerebellar artery, and superior cerebral artery. Left carotid angiogram demonstrates normal course caliber and contour of the left CCA and cervical ICA. Patent external carotid artery. Suggestion of mild atherosclerotic changes at the origin of the A1 segment, with patent 82 segment. Patent middle cerebral artery with typical appearance of the arterial, late arterial, capillary and venous phase. IMPRESSION: Status post cerebral angiogram with attempted mechanical thrombectomy of right MCA occlusion. After 2 passes with local aspiration and stentreiver technology, there is persistent occlusion and TICI 1 flow. Excellent collateral flow perfusing the right MCA territory. My impression is that there is likely underlying stenosis at the site of occlusion given the collateral flow, and that further manipulation may cause dissection or hemorrhage. Given the excellent collateral flow and absence of core infarction on the perfusion imaging, we elected to defer further attempts at thrombectomy. Deployment of  Exoseal for hemostasis. Signed, Yvone Neu. Loreta Ave, DO [facility]  Radiology Electronically Signed   By: Gilmer MorJaime  Wagner D.O.   On: 03/11/2016 13:10   Ct Cerebral Perfusion W Contrast  Result Date: 03/11/2016 CLINICAL DATA:  51 year old male with right MCA M1 occlusion on CTA and only small lacunar type infarct in the right lentiform evident by CT since 05/1938 4 hours today. Initial encounter. EXAM: CT PERFUSION BRAIN TECHNIQUE: Multiphase CT imaging of the brain was performed following IV bolus contrast injection. Subsequent parametric perfusion maps were calculated using RAPID software. CONTRAST:  50 mL Isovue 370 COMPARISON:  CTA head and neck performed at 0640 hours today. Head CTs earlier today. FINDINGS: CT Brain Perfusion Findings: CBF (<30%) Volume:  0 mL Perfusion (Tmax>6.0s) volume: 118 mL mL, corresponding to the right MCA territory Mismatch Volume:  " Infinite" Infarction Location:Noncontrast head CT suggests there is a small core infarct in the right lentiform nuclei on this patient, but this is not detected using the perfusion thresholds for this exam. IMPRESSION: Large area of right MCA territory penumbra. Clinically suspected small volume core infarct in the right lentiform not detected. Overall very favorable CTP characteristics for endovascular reperfusion. I am advised that they are prepping for Neurointervention at the time of this dictation. Electronically Signed   By: Odessa FlemingH  Hall M.D.   On: 03/11/2016 09:27   Portable Chest Xray  Result Date: 03/11/2016 CLINICAL DATA:  Hypoxia EXAM: PORTABLE CHEST 1 VIEW COMPARISON:  None. FINDINGS: Endotracheal tube tip is 2.9 cm above the carina. No pneumothorax. There is no edema or consolidation. Heart size and pulmonary vascularity are normal. No adenopathy. No bone lesions. IMPRESSION: Endotracheal tube as described without pneumothorax. No edema or consolidation. Electronically Signed   By: Bretta BangWilliam   Woodruff III M.D.   On: 03/11/2016 15:56   Ir Percutaneous Art Thrombectomy/infusion Intracranial Inc Diag Angio  Result Date: 03/11/2016 INDICATION: 51 year old male presenting with acute left-sided symptoms and imaging evidence of right MCA emergent large vessel occlusion. CT imaging demonstrates normal aspects score, with CT perfusion shadowing elevated mean transit time with potential tissue at risk and no core infarct. EXAM: IR PERCUTANEOUS ART THORMBECTOMY/INFUSION INTRACRANIAL INCLUDE DIAG ANGIO; IR ANGIO VERTEBRAL SEL VERTEBRAL UNI RIGHT MOD SED; IR ANGIO INTRA EXTRACRAN SEL COM CAROTID INNOMINATE UNI LEFT MOD SED; IR ULTRASOUND GUIDANCE VASC ACCESS RIGHT COMPARISON:  No prior CT angiogram MEDICATIONS: 2.0 g Ancef. The antibiotic was administered within 1 hour of the procedure ANESTHESIA/SEDATION: Anesthesia team provided general anesthesia CONTRAST:  160 cc Isovue-300 FLUOROSCOPY TIME:  Fluoroscopy Time: 24 minutes 36 seconds (533 mGy). COMPLICATIONS: None TECHNIQUE: Informed written consent was obtained from the patient and the patient's family after a thorough discussion of the procedural risks, benefits and alternatives. Specific risks discussed include: Bleeding, infection, contrast reaction, kidney injury/failure, need for further procedure/surgery, arterial injury or dissection, embolization to new territory, intracranial hemorrhage (10-15% risk), neurologic deterioration, cardiopulmonary collapse, death. All questions were addressed. Maximal Sterile Barrier Technique was utilized including during the procedure including caps, mask, sterile gowns, sterile gloves, sterile drape, hand hygiene and skin antiseptic. A timeout was performed prior to the initiation of the procedure. Ultrasound survey of the right inguinal region was performed with images stored and sent to PACs. 11 blade scalpel was used to make a small incision. A micropuncture needle was used access the right common femoral artery  under ultrasound. With excellent arterial blood flow returned, an .018 micro wire was passed through the needle, observed to enter the abdominal aorta under fluoroscopy. The needle was removed, and a micropuncture sheath  was placed over the wire. The inner dilator and wire were removed, and an 035 Bentson wire was advanced under fluoroscopy into the abdominal aorta. The sheath was removed and a standard 5 [place] vascular sheath was placed. The dilator was removed and the sheath was flushed. A 47F JB-1 diagnostic catheter was advanced over the wire to the proximal descending thoracic aorta. Wire was then removed. Double flush of the catheter was performed. Catheter was then used to select the innominate artery. Angiogram was performed. Using roadmap technique, the catheter was advanced over a roadrunner wire into cervical ICA. Formal angiogram was performed. Exchange length Rosen wire was then passed through the diagnostic catheter to the distal cervical ICA and the diagnostic catheter was removed. The 5 French sheath was removed and exchanged for 8 French 55 centimeter BrightTip sheath. Sheath was flushed and attached to pressurized and heparinized saline bag for constant forward flow. Then a neuron Max 85cm catheter was prepared on the back table. Ace 68 intermediate catheter was then loaded though the Neuron Max catheter and advanced over the [facility] wire to the internal carotid artery. Wire was removed, and roadmap angiogram was performed. Microcatheter system was then introduced through the Ace catheter using a synchro soft 014 wire and a Trevo Provue18 catheter. Microcatheter system was advanced into the internal carotid artery, to the level of the occlusion. The micro wire was then carefully advanced through the occluded segment. Microcatheter was then push through the occluded segment and the wire was removed. Ace catheter was advanced over the micro wire to the ophthalmic ICA segment. Blood was then aspirated  through the hub of the microcatheter, and a gentle contrast injection was performed confirming intraluminal position. A rotating hemostatic valve was then attached to the back end of the microcatheter, and a pressurized and heparinized saline bag was attached to the catheter. 4 x 40 solitaire device was then selected. Back flush was achieved at the rotating hemostatic valve, and then the device was gently advanced through the microcatheter to the distal end. The retriever was then unsheathed by withdrawing the microcatheter under fluoroscopy. Once the retriever was completely unsheathed, control angiogram was performed from the balloon catheter. Constant aspiration was then performed through the Ace catheter as the retriever was gently and slowly withdrawn with fluoroscopic observation. Once the retriever was entirely removed from the system, free aspiration was confirmed at the hub of the intermediate catheter, with free blood return confirmed. Control angiogram was then performed. Persistent occlusion at the proximal MCA was confirmed. The microcatheter system was then advanced through the Ace catheter. On the 2nd attempt, a lateral view was required in order to guide the microcatheter system beyond the ophthalmic artery origin. Once the micro wire microcatheter were beyond the occlusion, the micro wire was removed and solitaire 4 x 40 device was deployed. After the solitaire was deployed across the occlusion, the Ace catheter was advanced to the M1 segment at the site of the occlusion. Local aspiration was performed upon withdrawal of the solitaire device under fluoroscopic observation. Once the retrieve her was entirely removed from the system, free aspiration was confirmed at the hub of the intermediate catheter, with free blood return confirmed. Control angiogram was again performed. Review of the available imaging was performed at this time. The intermediate catheter was removed and Neuro Max catheter were  removed. Diagnostic imaging was performed at the right vert origin. Angiogram of the left carotid system was also performed including intracranial images. Catheter was removed, and the  bright tip sheath was exchanged for a short 8 French sheath at the right common femoral artery. Control angiogram was performed at the right common femoral artery puncture site. After the angiogram, right common femoral sheath was removed with deployment of Exoseal device. Patient tolerated the procedure well and remained hemodynamically stable throughout. No complications were encountered. Estimated blood loss approximately 50 cc. FINDINGS: Baseline angiogram Right common carotid artery:  Normal course caliber and contour. Right external carotid artery: Patent with antegrade flow. Significant flow through the superficial temporal branch, potentially contributing to collateral flow intracranial. Right internal carotid artery: Normal course caliber and contour of the cervical portion. Vertical and petrous segment patent with normal course caliber contour. Cavernous segment patent. Clinoid segment patent. Antegrade flow of the ophthalmic artery. Ophthalmic segment patent. Terminus patent. Right MCA: Proximal occlusion of the middle cerebral artery. There is excellent collateral flow to the right hemisphere VA anterior cerebral artery. Right ACA: A 1 segment patent. A 2 segment perfuses the right territory. Baseline perfusion TICI 1, with excellent collateral perfusion. After the 1st pass, there is no significant improvement through the occlusion, with persistent TICI 1. After the 2nd pass, there is very minimal improvement in flow through the occluded segment, with persisting TICI 1 flow, and excellent collateral flow maintained. Vertebral origin injection demonstrates normal course contour and caliber of the right vertebral artery, with patent V3 segment. Cross flow from the left vertebral artery with partial washout in the basilar  artery. Patent posterior inferior cerebellar artery, anterior inferior cerebellar artery, and superior cerebral artery. Left carotid angiogram demonstrates normal course caliber and contour of the left CCA and cervical ICA. Patent external carotid artery. Suggestion of mild atherosclerotic changes at the origin of the A1 segment, with patent 82 segment. Patent middle cerebral artery with typical appearance of the arterial, late arterial, capillary and venous phase. IMPRESSION: Status post cerebral angiogram with attempted mechanical thrombectomy of right MCA occlusion. After 2 passes with local aspiration and stentreiver technology, there is persistent occlusion and TICI 1 flow. Excellent collateral flow perfusing the right MCA territory. My impression is that there is likely underlying stenosis at the site of occlusion given the collateral flow, and that further manipulation may cause dissection or hemorrhage. Given the excellent collateral flow and absence of core infarction on the perfusion imaging, we elected to defer further attempts at thrombectomy. Deployment of Exoseal for hemostasis. Signed, Yvone Neu. Loreta Ave, DO [facility] Radiology Electronically Signed   By: [name] D.O.   On: 03/11/2016 13:10   Ct Head Code Stroke Wo Contrast`  Result Date: 03/11/2016 CLINICAL DATA:  Code stroke. 51 year old male with right MCA M1 occlusion on CTA head and neck for evaluation of 2 days of left side weakness. Initial encounter. EXAM: CT HEAD WITHOUT CONTRAST TECHNIQUE: Contiguous axial images were obtained from the base of the skull through the vertex without intravenous contrast. COMPARISON:  CTA 0640 hours today. Head CT without contrast 0544 hours today. FINDINGS: Brain: There is stable hypodensity in the right lentiform nuclei, but otherwise no cytotoxic edema is evident in the right MCA territory. Gray-white matter differentiation appears stable No acute  intracranial hemorrhage identified. No midline shift, mass effect, or evidence of intracranial mass lesion. No ventriculomegaly. Vascular: Mild residual intravascular contrast. Skull: Previous right frontotemporal craniotomy. No acute osseous abnormality identified. Sinuses/Orbits: Stable. Well pneumatized in general. Chronic left lamina papyracea fracture. Other: No acute orbit or scalp soft tissue findings. ASPECTS [facility] Stroke Program Early  CT Score) - Ganglionic level infarction (caudate, lentiform nuclei, internal capsule, insula, M1-M3 cortex): 6 (minus 1 for L) - Supraganglionic infarction (M4-M6 cortex): 3 Total score (0-10 with 10 being normal): 9 IMPRESSION: 1. Stable noncontrast CT appearance of the brain since 0544 hours today: Hypodensity in the right lentiform nucleus, but no other CT changes of right MCA infarct. No acute intracranial hemorrhage. 2. ASPECTS is 9. 3. I was advised that Dr. Ruthy Dick was discussing the findings on this study with Neurology at 0916 hours. Electronically Signed   By: Odessa Fleming M.D.   On: 03/11/2016 09:22   Ir Angio Intra Extracran Sel Com Carotid Innominate Uni L Mod Sed  Result Date: 03/11/2016 INDICATION: 51 year old male presenting with acute left-sided symptoms and imaging evidence of right MCA emergent large vessel occlusion. CT imaging demonstrates normal aspects score, with CT perfusion shadowing elevated mean transit time with potential tissue at risk and no core infarct. EXAM: IR PERCUTANEOUS ART THORMBECTOMY/INFUSION INTRACRANIAL INCLUDE DIAG ANGIO; IR ANGIO VERTEBRAL SEL VERTEBRAL UNI RIGHT MOD SED; IR ANGIO INTRA EXTRACRAN SEL COM CAROTID INNOMINATE UNI LEFT MOD SED; IR ULTRASOUND GUIDANCE VASC ACCESS RIGHT COMPARISON:  No prior CT angiogram MEDICATIONS: 2.0 g Ancef. The antibiotic was administered within 1 hour of the procedure ANESTHESIA/SEDATION: Anesthesia team provided general anesthesia CONTRAST:  160 cc Isovue-300 FLUOROSCOPY TIME:  Fluoroscopy  Time: 24 minutes 36 seconds (533 mGy). COMPLICATIONS: None TECHNIQUE: Informed written consent was obtained from the patient and the patient's family after a thorough discussion of the procedural risks, benefits and alternatives. Specific risks discussed include: Bleeding, infection, contrast reaction, kidney injury/failure, need for further procedure/surgery, arterial injury or dissection, embolization to new territory, intracranial hemorrhage (10-15% risk), neurologic deterioration, cardiopulmonary collapse, death. All questions were addressed. Maximal Sterile Barrier Technique was utilized including during the procedure including caps, mask, sterile gowns, sterile gloves, sterile drape, hand hygiene and skin antiseptic. A timeout was performed prior to the initiation of the procedure. Ultrasound survey of the right inguinal region was performed with images stored and sent to PACs. 11 blade scalpel was used to make a small incision. A micropuncture needle was used access the right common femoral artery under ultrasound. With excellent arterial blood flow returned, an .018 micro wire was passed through the needle, observed to enter the abdominal aorta under fluoroscopy. The needle was removed, and a micropuncture sheath was placed over the wire. The inner dilator and wire were removed, and an 035 Bentson wire was advanced under fluoroscopy into the abdominal aorta. The sheath was removed and a standard 5 [place] vascular sheath was placed. The dilator was removed and the sheath was flushed. A 86F JB-1 diagnostic catheter was advanced over the wire to the proximal descending thoracic aorta. Wire was then removed. Double flush of the catheter was performed. Catheter was then used to select the innominate artery. Angiogram was performed. Using roadmap technique, the catheter was advanced over a roadrunner wire into cervical ICA. Formal angiogram was performed. Exchange length Rosen wire was then passed through the  diagnostic catheter to the distal cervical ICA and the diagnostic catheter was removed. The 5 French sheath was removed and exchanged for 8 French 55 centimeter BrightTip sheath. Sheath was flushed and attached to pressurized and heparinized saline bag for constant forward flow. Then a neuron Max 85cm catheter was prepared on the back table. Ace 68 intermediate catheter was then loaded though the Neuron Max catheter and advanced over the [facility] wire to the internal carotid artery. Wire  was removed, and roadmap angiogram was performed. Microcatheter system was then introduced through the Ace catheter using a synchro soft 014 wire and a Trevo Provue18 catheter. Microcatheter system was advanced into the internal carotid artery, to the level of the occlusion. The micro wire was then carefully advanced through the occluded segment. Microcatheter was then push through the occluded segment and the wire was removed. Ace catheter was advanced over the micro wire to the ophthalmic ICA segment. Blood was then aspirated through the hub of the microcatheter, and a gentle contrast injection was performed confirming intraluminal position. A rotating hemostatic valve was then attached to the back end of the microcatheter, and a pressurized and heparinized saline bag was attached to the catheter. 4 x 40 solitaire device was then selected. Back flush was achieved at the rotating hemostatic valve, and then the device was gently advanced through the microcatheter to the distal end. The retriever was then unsheathed by withdrawing the microcatheter under fluoroscopy. Once the retriever was completely unsheathed, control angiogram was performed from the balloon catheter. Constant aspiration was then performed through the Ace catheter as the retriever was gently and slowly withdrawn with fluoroscopic observation. Once the retriever was entirely removed from the system, free aspiration was confirmed at the hub of the intermediate catheter,  with free blood return confirmed. Control angiogram was then performed. Persistent occlusion at the proximal MCA was confirmed. The microcatheter system was then advanced through the Ace catheter. On the 2nd attempt, a lateral view was required in order to guide the microcatheter system beyond the ophthalmic artery origin. Once the micro wire microcatheter were beyond the occlusion, the micro wire was removed and solitaire 4 x 40 device was deployed. After the solitaire was deployed across the occlusion, the Ace catheter was advanced to the M1 segment at the site of the occlusion. Local aspiration was performed upon withdrawal of the solitaire device under fluoroscopic observation. Once the retrieve her was entirely removed from the system, free aspiration was confirmed at the hub of the intermediate catheter, with free blood return confirmed. Control angiogram was again performed. Review of the available imaging was performed at this time. The intermediate catheter was removed and Neuro Max catheter were removed. Diagnostic imaging was performed at the right vert origin. Angiogram of the left carotid system was also performed including intracranial images. Catheter was removed, and the bright tip sheath was exchanged for a short 8 French sheath at the right common femoral artery. Control angiogram was performed at the right common femoral artery puncture site. After the angiogram, right common femoral sheath was removed with deployment of Exoseal device. Patient tolerated the procedure well and remained hemodynamically stable throughout. No complications were encountered. Estimated blood loss approximately 50 cc. FINDINGS: Baseline angiogram Right common carotid artery:  Normal course caliber and contour. Right external carotid artery: Patent with antegrade flow. Significant flow through the superficial temporal branch, potentially contributing to collateral flow intracranial. Right internal carotid artery: Normal  course caliber and contour of the cervical portion. Vertical and petrous segment patent with normal course caliber contour. Cavernous segment patent. Clinoid segment patent. Antegrade flow of the ophthalmic artery. Ophthalmic segment patent. Terminus patent. Right MCA: Proximal occlusion of the middle cerebral artery. There is excellent collateral flow to the right hemisphere VA anterior cerebral artery. Right ACA: A 1 segment patent. A 2 segment perfuses the right territory. Baseline perfusion TICI 1, with excellent collateral perfusion. After the 1st pass, there is no significant improvement through the occlusion,  with persistent TICI 1. After the 2nd pass, there is very minimal improvement in flow through the occluded segment, with persisting TICI 1 flow, and excellent collateral flow maintained. Vertebral origin injection demonstrates normal course contour and caliber of the right vertebral artery, with patent V3 segment. Cross flow from the left vertebral artery with partial washout in the basilar artery. Patent posterior inferior cerebellar artery, anterior inferior cerebellar artery, and superior cerebral artery. Left carotid angiogram demonstrates normal course caliber and contour of the left CCA and cervical ICA. Patent external carotid artery. Suggestion of mild atherosclerotic changes at the origin of the A1 segment, with patent 82 segment. Patent middle cerebral artery with typical appearance of the arterial, late arterial, capillary and venous phase. IMPRESSION: Status post cerebral angiogram with attempted mechanical thrombectomy of right MCA occlusion. After 2 passes with local aspiration and stentreiver technology, there is persistent occlusion and TICI 1 flow. Excellent collateral flow perfusing the right MCA territory. My impression is that there is likely underlying stenosis at the site of occlusion given the collateral flow, and that further manipulation may cause dissection or hemorrhage. Given  the excellent collateral flow and absence of core infarction on the perfusion imaging, we elected to defer further attempts at thrombectomy. Deployment of Exoseal for hemostasis. Signed, Yvone Neu. Loreta Ave, DO [facility] Radiology Electronically Signed   By: [name] D.O.   On: 03/11/2016 13:10   Ir Angio Vertebral Sel Vertebral Uni R Mod Sed  Result Date: 03/11/2016 INDICATION: 51 year old male presenting with acute left-sided symptoms and imaging evidence of right MCA emergent large vessel occlusion. CT imaging demonstrates normal aspects score, with CT perfusion shadowing elevated mean transit time with potential tissue at risk and no core infarct. EXAM: IR PERCUTANEOUS ART THORMBECTOMY/INFUSION INTRACRANIAL INCLUDE DIAG ANGIO; IR ANGIO VERTEBRAL SEL VERTEBRAL UNI RIGHT MOD SED; IR ANGIO INTRA EXTRACRAN SEL COM CAROTID INNOMINATE UNI LEFT MOD SED; IR ULTRASOUND GUIDANCE VASC ACCESS RIGHT COMPARISON:  No prior CT angiogram MEDICATIONS: 2.0 g Ancef. The antibiotic was administered within 1 hour of the procedure ANESTHESIA/SEDATION: Anesthesia team provided general anesthesia CONTRAST:  160 cc Isovue-300 FLUOROSCOPY TIME:  Fluoroscopy Time: 24 minutes 36 seconds (533 mGy). COMPLICATIONS: None TECHNIQUE: Informed written consent was obtained from the patient and the patient's family after a thorough discussion of the procedural risks, benefits and alternatives. Specific risks discussed include: Bleeding, infection, contrast reaction, kidney injury/failure, need for further procedure/surgery, arterial injury or dissection, embolization to new territory, intracranial hemorrhage (10-15% risk), neurologic deterioration, cardiopulmonary collapse, death. All questions were addressed. Maximal Sterile Barrier Technique was utilized including during the procedure including caps, mask, sterile gowns, sterile gloves, sterile drape, hand hygiene and skin antiseptic. A  timeout was performed prior to the initiation of the procedure. Ultrasound survey of the right inguinal region was performed with images stored and sent to PACs. 11 blade scalpel was used to make a small incision. A micropuncture needle was used access the right common femoral artery under ultrasound. With excellent arterial blood flow returned, an .018 micro wire was passed through the needle, observed to enter the abdominal aorta under fluoroscopy. The needle was removed, and a micropuncture sheath was placed over the wire. The inner dilator and wire were removed, and an 035 Bentson wire was advanced under fluoroscopy into the abdominal aorta. The sheath was removed and a standard 5 [place] vascular sheath was placed. The dilator was removed and the sheath was flushed. A 13F JB-1 diagnostic catheter was advanced  over the wire to the proximal descending thoracic aorta. Wire was then removed. Double flush of the catheter was performed. Catheter was then used to select the innominate artery. Angiogram was performed. Using roadmap technique, the catheter was advanced over a roadrunner wire into cervical ICA. Formal angiogram was performed. Exchange length Rosen wire was then passed through the diagnostic catheter to the distal cervical ICA and the diagnostic catheter was removed. The 5 French sheath was removed and exchanged for 8 French 55 centimeter BrightTip sheath. Sheath was flushed and attached to pressurized and heparinized saline bag for constant forward flow. Then a neuron Max 85cm catheter was prepared on the back table. Ace 68 intermediate catheter was then loaded though the Neuron Max catheter and advanced over the [facility] wire to the internal carotid artery. Wire was removed, and roadmap angiogram was performed. Microcatheter system was then introduced through the Ace catheter using a synchro soft 014 wire and a Trevo Provue18 catheter. Microcatheter system was advanced into the internal carotid artery, to  the level of the occlusion. The micro wire was then carefully advanced through the occluded segment. Microcatheter was then push through the occluded segment and the wire was removed. Ace catheter was advanced over the micro wire to the ophthalmic ICA segment. Blood was then aspirated through the hub of the microcatheter, and a gentle contrast injection was performed confirming intraluminal position. A rotating hemostatic valve was then attached to the back end of the microcatheter, and a pressurized and heparinized saline bag was attached to the catheter. 4 x 40 solitaire device was then selected. Back flush was achieved at the rotating hemostatic valve, and then the device was gently advanced through the microcatheter to the distal end. The retriever was then unsheathed by withdrawing the microcatheter under fluoroscopy. Once the retriever was completely unsheathed, control angiogram was performed from the balloon catheter. Constant aspiration was then performed through the Ace catheter as the retriever was gently and slowly withdrawn with fluoroscopic observation. Once the retriever was entirely removed from the system, free aspiration was confirmed at the hub of the intermediate catheter, with free blood return confirmed. Control angiogram was then performed. Persistent occlusion at the proximal MCA was confirmed. The microcatheter system was then advanced through the Ace catheter. On the 2nd attempt, a lateral view was required in order to guide the microcatheter system beyond the ophthalmic artery origin. Once the micro wire microcatheter were beyond the occlusion, the micro wire was removed and solitaire 4 x 40 device was deployed. After the solitaire was deployed across the occlusion, the Ace catheter was advanced to the M1 segment at the site of the occlusion. Local aspiration was performed upon withdrawal of the solitaire device under fluoroscopic observation. Once the retrieve her was entirely removed  from the system, free aspiration was confirmed at the hub of the intermediate catheter, with free blood return confirmed. Control angiogram was again performed. Review of the available imaging was performed at this time. The intermediate catheter was removed and Neuro Max catheter were removed. Diagnostic imaging was performed at the right vert origin. Angiogram of the left carotid system was also performed including intracranial images. Catheter was removed, and the bright tip sheath was exchanged for a short 8 French sheath at the right common femoral artery. Control angiogram was performed at the right common femoral artery puncture site. After the angiogram, right common femoral sheath was removed with deployment of Exoseal device. Patient tolerated the procedure well and remained hemodynamically stable throughout. No  complications were encountered. Estimated blood loss approximately 50 cc. FINDINGS: Baseline angiogram Right common carotid artery:  Normal course caliber and contour. Right external carotid artery: Patent with antegrade flow. Significant flow through the superficial temporal branch, potentially contributing to collateral flow intracranial. Right internal carotid artery: Normal course caliber and contour of the cervical portion. Vertical and petrous segment patent with normal course caliber contour. Cavernous segment patent. Clinoid segment patent. Antegrade flow of the ophthalmic artery. Ophthalmic segment patent. Terminus patent. Right MCA: Proximal occlusion of the middle cerebral artery. There is excellent collateral flow to the right hemisphere VA anterior cerebral artery. Right ACA: A 1 segment patent. A 2 segment perfuses the right territory. Baseline perfusion TICI 1, with excellent collateral perfusion. After the 1st pass, there is no significant improvement through the occlusion, with persistent TICI 1. After the 2nd pass, there is very minimal improvement in flow through the occluded  segment, with persisting TICI 1 flow, and excellent collateral flow maintained. Vertebral origin injection demonstrates normal course contour and caliber of the right vertebral artery, with patent V3 segment. Cross flow from the left vertebral artery with partial washout in the basilar artery. Patent posterior inferior cerebellar artery, anterior inferior cerebellar artery, and superior cerebral artery. Left carotid angiogram demonstrates normal course caliber and contour of the left CCA and cervical ICA. Patent external carotid artery. Suggestion of mild atherosclerotic changes at the origin of the A1 segment, with patent 82 segment. Patent middle cerebral artery with typical appearance of the arterial, late arterial, capillary and venous phase. IMPRESSION: Status post cerebral angiogram with attempted mechanical thrombectomy of right MCA occlusion. After 2 passes with local aspiration and stentreiver technology, there is persistent occlusion and TICI 1 flow. Excellent collateral flow perfusing the right MCA territory. My impression is that there is likely underlying stenosis at the site of occlusion given the collateral flow, and that further manipulation may cause dissection or hemorrhage. Given the excellent collateral flow and absence of core infarction on the perfusion imaging, we elected to defer further attempts at thrombectomy. Deployment of Exoseal for hemostasis. Signed, Yvone Neu. Loreta Ave, DO [facility] Radiology Electronically Signed   By: [name] D.O.   On: 03/11/2016 13:10    Assessment/Plan : Acute hypoxemic respiratory failure 2/2 unable to rpotect airway with IR procedure.  - doing fair on PST 10/5.  Cont PST as tolerated - pt needs to lay flat until 530pm.  If he does well at 530 pm with PST, anticipate, we can extubate pt.  - if NOT extubated, may try to keep on PRVC or SIMV (if uncomfortable with PRVC). May also need versed prn if not  extubated.  He was trying to pull ETT earlier.   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  - keep SBP 140 mm Hg  Tobacco abuse - counselled re: tobacco cessation  Best practice - SCDs - heparin SQ for DVT prophylaxis once OK with IR and Neuro - H2 blocker for SUP      I spent  30   minutes of Critical Care time with this patient today. This is my time spent independent of the APP or resident.   Family :Family updated at length today.  Discussed plan with pt's wife and daughter.    Pollie Meyer, MD 03/11/2016, 4:14 PM [facility] Pager [phone] After 3 pm or if no answer, call [phone]

## 2016-03-11 NOTE — Anesthesia Preprocedure Evaluation (Signed)
Anesthesia Evaluation  Patient identified by MRN, date of birth, ID band Patient awake    Reviewed: Allergy & Precautions, H&P , NPO status , Patient's Chart, lab work & pertinent test results  Airway Mallampati: II  TM Distance: >3 FB Neck ROM: Full    Dental no notable dental hx. (+) Chipped, Dental Advisory Given   Pulmonary neg pulmonary ROS,    Pulmonary exam normal breath sounds clear to auscultation       Cardiovascular negative cardio ROS   Rhythm:Regular Rate:Normal     Neuro/Psych CVA, Residual Symptoms negative psych ROS   GI/Hepatic negative GI ROS, Neg liver ROS,   Endo/Other  negative endocrine ROS  Renal/GU negative Renal ROS  negative genitourinary   Musculoskeletal   Abdominal   Peds  Hematology negative hematology ROS (+)   Anesthesia Other Findings   Reproductive/Obstetrics negative OB ROS                             Anesthesia Physical Anesthesia Plan  ASA: III and emergent  Anesthesia Plan: General   Post-op Pain Management:    Induction: Intravenous, Rapid sequence and Cricoid pressure planned  Airway Management Planned: Oral ETT  Additional Equipment: Arterial line  Intra-op Plan:   Post-operative Plan: Post-operative intubation/ventilation  Informed Consent: I have reviewed the patients History and Physical, chart, labs and discussed the procedure including the risks, benefits and alternatives for the proposed anesthesia with the patient or authorized representative who has indicated his/her understanding and acceptance.   Dental advisory given  Plan Discussed with: CRNA  Anesthesia Plan Comments:         Anesthesia Quick Evaluation

## 2016-03-11 NOTE — Anesthesia Postprocedure Evaluation (Signed)
Anesthesia Post Note  Patient: Grant Garcia  Procedure(s) Performed: Procedure(s) (LRB): RADIOLOGY WITH ANESTHESIA (N/A)  Patient location during evaluation: PACU Anesthesia Type: General Level of consciousness: sedated and patient remains intubated per anesthesia plan Pain management: pain level controlled Vital Signs Assessment: post-procedure vital signs reviewed and stable Respiratory status: patient on ventilator - see flowsheet for VS and patient remains intubated per anesthesia plan Cardiovascular status: blood pressure returned to baseline and stable Anesthetic complications: no       Last Vitals:  Vitals:   03/11/16 1245 03/11/16 1300  BP: 114/61 111/60  Pulse: (!) 57 (!) 52  Resp: 14 14  Temp:      Last Pain: There were no vitals filed for this visit.               Lavarius Doughten,W. EDMOND

## 2016-03-11 NOTE — Progress Notes (Signed)
Neuro-Interventional Radiology Pre-Procedure Note   51 yo male with acute left sided weakness developing last night worsening by the am.   Evaluated at [facility] ED with imaging revealing ELVO of the right MCA.   ASPECTS = 10, no hemorrhage  Transferred after neurology contacted as Code stroke.   Perfusion study performed with repeat non-contrast head CT upon arrival, with no ASPECTS deterioration, and large penumbra.   NIHSS >7, and within 24 hours.    Patient is candidate for thrombectomy.    Risks and Benefits discussed via interpreter with the patient including, but not limited to bleeding, infection, vascular injury, contrast induced renal failure, neurologic deterioration, intracranial hemorrhage (10-15%), need for further procedure/surgery, need for rehabilitation, cardiopulmonary collapse, death.  All of the patient's questions were answered, patient is agreeable to proceed. Consent signed and in chart.   Plan to proceed with cerebral angiogram and thrombectomy with general anesthesia.   Signed,  Yvone NeuJaime S. Loreta AveWagner, DO

## 2016-03-11 NOTE — Anesthesia Postprocedure Evaluation (Signed)
Anesthesia Post Note  Patient: Grant Garcia  Procedure(s) Performed: Procedure(s) (LRB): RADIOLOGY WITH ANESTHESIA (N/A)  Anesthesia Type: General       Last Vitals:  Vitals:   03/11/16 1245 03/11/16 1300  BP: 114/61 111/60  Pulse: (!) 57 (!) 52  Resp: 14 14  Temp:      Last Pain: There were no vitals filed for this visit.               Sabrin Dunlevy,W. EDMOND

## 2016-03-12 ENCOUNTER — Inpatient Hospital Stay (HOSPITAL_COMMUNITY): Payer: Medicaid Other

## 2016-03-12 ENCOUNTER — Encounter (HOSPITAL_COMMUNITY): Payer: Self-pay | Admitting: Radiology

## 2016-03-12 DIAGNOSIS — F17200 Nicotine dependence, unspecified, uncomplicated: Secondary | ICD-10-CM

## 2016-03-12 DIAGNOSIS — I63511 Cerebral infarction due to unspecified occlusion or stenosis of right middle cerebral artery: Principal | ICD-10-CM

## 2016-03-12 DIAGNOSIS — G8194 Hemiplegia, unspecified affecting left nondominant side: Secondary | ICD-10-CM

## 2016-03-12 DIAGNOSIS — E785 Hyperlipidemia, unspecified: Secondary | ICD-10-CM

## 2016-03-12 DIAGNOSIS — I6789 Other cerebrovascular disease: Secondary | ICD-10-CM

## 2016-03-12 LAB — LIPID PANEL
CHOL/HDL RATIO: 5.9 ratio
Cholesterol: 190 mg/dL (ref 0–200)
HDL: 32 mg/dL — ABNORMAL LOW (ref >40)
LDL CALC: 134 mg/dL — AB (ref 0–99)
Triglycerides: 118 mg/dL (ref <150)
VLDL: 24 mg/dL (ref 0–40)

## 2016-03-12 LAB — CBC
HCT: 38.7 % — ABNORMAL LOW (ref 39.0–52.0)
Hemoglobin: 12.8 g/dL — ABNORMAL LOW (ref 13.0–17.0)
MCH: 28.1 pg (ref 26.0–34.0)
MCHC: 33.1 g/dL (ref 30.0–36.0)
MCV: 84.9 fL (ref 78.0–100.0)
PLATELETS: 146 10*3/uL — AB (ref 150–400)
RBC: 4.56 MIL/uL (ref 4.22–5.81)
RDW: 13.3 % (ref 11.5–15.5)
WBC: 17.5 10*3/uL — AB (ref 4.0–10.5)

## 2016-03-12 LAB — BASIC METABOLIC PANEL
Anion gap: 8 (ref 5–15)
BUN: 12 mg/dL (ref 6–20)
CALCIUM: 8.9 mg/dL (ref 8.9–10.3)
CO2: 26 mmol/L (ref 22–32)
CREATININE: 0.67 mg/dL (ref 0.61–1.24)
Chloride: 105 mmol/L (ref 101–111)
GFR calc non Af Amer: >60 mL/min (ref >60)
Glucose, Bld: 109 mg/dL — ABNORMAL HIGH (ref 65–99)
Potassium: 3.6 mmol/L (ref 3.5–5.1)
SODIUM: 139 mmol/L (ref 135–145)

## 2016-03-12 LAB — MAGNESIUM: MAGNESIUM: 2 mg/dL (ref 1.7–2.4)

## 2016-03-12 LAB — RAPID URINE DRUG SCREEN, HOSP PERFORMED
AMPHETAMINES: NOT DETECTED
BENZODIAZEPINES: NOT DETECTED
Barbiturates: NOT DETECTED
Cocaine: NOT DETECTED
OPIATES: NOT DETECTED
TETRAHYDROCANNABINOL: NOT DETECTED

## 2016-03-12 LAB — PHOSPHORUS: Phosphorus: 3.6 mg/dL (ref 2.5–4.6)

## 2016-03-12 LAB — ECHOCARDIOGRAM COMPLETE
HEIGHTINCHES: 66 in
WEIGHTICAEL: 2634.94 [oz_av]

## 2016-03-12 MED ORDER — ATORVASTATIN CALCIUM 80 MG PO TABS
80.0000 mg | ORAL_TABLET | Freq: Every day | ORAL | Status: DC
  Administered 2016-03-12 – 2016-03-13 (×2): 80 mg via ORAL
  Filled 2016-03-12 (×2): qty 1

## 2016-03-12 MED ORDER — LORAZEPAM 2 MG/ML IJ SOLN
2.0000 mg | Freq: Once | INTRAMUSCULAR | Status: AC
  Administered 2016-03-12: 2 mg via INTRAVENOUS

## 2016-03-12 MED ORDER — HYDRALAZINE HCL 20 MG/ML IJ SOLN: 10.0000 mg | INTRAMUSCULAR | Status: DC | PRN

## 2016-03-12 MED ORDER — ASPIRIN EC 325 MG PO TBEC
325.0000 mg | DELAYED_RELEASE_TABLET | Freq: Every day | ORAL | Status: DC
  Administered 2016-03-12 – 2016-03-14 (×3): 325 mg via ORAL
  Filled 2016-03-12 (×3): qty 1

## 2016-03-12 MED ORDER — FAMOTIDINE 20 MG PO TABS
20.0000 mg | ORAL_TABLET | Freq: Every day | ORAL | Status: DC
  Administered 2016-03-13 – 2016-03-14 (×2): 20 mg via ORAL
  Filled 2016-03-12 (×2): qty 1

## 2016-03-12 MED ORDER — LORAZEPAM 2 MG/ML IJ SOLN
INTRAMUSCULAR | Status: AC
  Filled 2016-03-12: qty 1

## 2016-03-12 MED ORDER — CLOPIDOGREL BISULFATE 75 MG PO TABS
75.0000 mg | ORAL_TABLET | Freq: Every day | ORAL | Status: DC
  Administered 2016-03-12 – 2016-03-14 (×3): 75 mg via ORAL
  Filled 2016-03-12 (×3): qty 1

## 2016-03-12 MED ORDER — ENOXAPARIN SODIUM 40 MG/0.4ML ~~LOC~~ SOLN
40.0000 mg | SUBCUTANEOUS | Status: DC
  Administered 2016-03-12 – 2016-03-14 (×3): 40 mg via SUBCUTANEOUS
  Filled 2016-03-12 (×3): qty 0.4

## 2016-03-12 MED ORDER — ASPIRIN 300 MG RE SUPP: 300.0000 mg | Freq: Every day | RECTAL | Status: DC

## 2016-03-12 NOTE — Progress Notes (Signed)
STROKE TEAM PROGRESS NOTE   HISTORY OF PRESENT ILLNESS (per record) Grant Garcia is an 51 y.o. male who is Spanish-speaking only.  Hx obtained from staff as no family present.  Went to bed feeling weak all over but no focal findings. Patient was last known well 03/10/2016 at 2200. Patient was not administered IV t-PA secondary to arriving outside of the window. CTA showed an apparent right M1 occlusion versus chronic stenosis. Patient was sent to angio for thrombectomy evaluation. 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  He was admitted to the neuro ICU for further evaluation and treatment.   SUBJECTIVE (INTERVAL HISTORY) Wife and daughter at bedside. They stated that pt was not on medication and not seeing doctor for many years. He is heavy smoker, 1.5-2 PPD for long time. Pt was last seen normal Tuesday night. Around Wednesday early morning, he felt dizzy, walking around, fell 3 times. Left facial droop and veer off to the left on walking. However, he was able to walk to ambulance. In [facility], his left sided weakness getting worse but able to hold on against gravity before transfer to [facility]. No history of stroke in the past.    OBJECTIVE Temp:  [97.2 F (36.2 C)-98.7 F (37.1 C)] 98.7 F (37.1 C) (02/15 0800) Pulse Rate:  [43-100] 61 (02/15 0800) Cardiac Rhythm: Normal sinus rhythm (02/15 0800) Resp:  [13-30] 18 (02/15 0800) BP: (102-178)/(46-136) 124/78 (02/15 0800) SpO2:  [97 %-100 %] 100 % (02/15 0800) Arterial Line BP: (99-187)/(39-111) 155/67 (02/15 0800) FiO2 (%):  [40 %-100 %] 40 % (02/14 1536) Weight:  [74.7 kg (164 lb 10.9 oz)-75 kg (165 lb 5.5 oz)] 74.7 kg (164 lb 10.9 oz) (02/14 1341)  CBC:  Recent Labs Lab 03/11/16 0920 03/12/16 0500  WBC 10.5 17.5*  NEUTROABS 7.3  --   HGB 14.8 12.8*  HCT 44.1 38.7*  MCV 84.6 84.9  PLT 145* 146*    Basic Metabolic Panel:  Recent Labs Lab 03/11/16 1623 03/12/16 0500  NA 141  139  K 3.8 3.6  CL 109 105  CO2 23 26  GLUCOSE 151* 109*  BUN 9 12  CREATININE 0.67 0.67  CALCIUM 8.8* 8.9  MG  --  2.0  PHOS  --  3.6    Lipid Panel:    Component Value Date/Time   CHOL 190 03/12/2016 0500   TRIG 118 03/12/2016 0500   HDL 32 (L) 03/12/2016 0500   CHOLHDL 5.9 03/12/2016 0500   VLDL 24 03/12/2016 0500   LDLCALC 134 (H) 03/12/2016 0500   HgbA1c: No results found for: HGBA1C Urine Drug Screen: No results found for: LABOPIA, COCAINSCRNUR, LABBENZ, AMPHETMU, THCU, LABBARB    IMAGING I have personally reviewed the radiological images below and agree with the radiology interpretations.  Ct Head Code Stroke Wo Contrast` 03/11/2016 1. Stable noncontrast CT appearance of the brain since 0544 hours today: Hypodensity in the right lentiform nucleus, but no other CT changes of right MCA infarct. No acute intracranial hemorrhage. 2. ASPECTS is 9.    Ct Cerebral Perfusion W Contrast 03/11/2016 Large area of right MCA territory penumbra. Clinically suspected small volume core infarct in the right lentiform not detected. Overall very favorable CTP characteristics for endovascular reperfusion. I am advised that they are prepping for Neurointervention at the time of this dictation. Electronically Signed   By: Odessa Fleming M.D.   On: 03/11/2016 09:27   Cerebral angio 03/11/2016 Status post cerebral angiogram  with attempted mechanical thrombectomy of right MCA occlusion. After 2 passes with local aspiration and stentreiver technology, there is persistent occlusion and TICI 1 flow. Excellent collateral flow perfusing the right MCA territory. My impression is that there is likely underlying stenosis at the site of occlusion given the collateral flow, and that further manipulation may cause dissection or hemorrhage. Given the excellent collateral flow and absence of core infarction on the perfusion imaging, we elected to defer further attempts at thrombectomy. Deployment of Exoseal for  hemostasis.   Ct Head Wo Contrast 03/11/2016 Stable postprocedural CT. No progression of the lenticulostriate infarct and no acute hemorrhage.   Mr Brain Wo Contrast 03/12/2016 Acute patchy multifocal RIGHT MCA territory infarct. No hemorrhage.   Mr Maxine GlennMra Head/brain Wo Cm 03/12/2016 Moderately motion degraded examination. RIGHT M1 occlusion, no collateral vessels by time-of-flight technique.   TTE - pending   PHYSICAL EXAM  Temp:  [98.2 F (36.8 C)-98.9 F (37.2 C)] 98.9 F (37.2 C) (02/15 1200) Pulse Rate:  [43-80] 62 (02/15 1400) Resp:  [13-30] 21 (02/15 1400) BP: (102-157)/(46-136) 119/86 (02/15 1400) SpO2:  [95 %-100 %] 98 % (02/15 1400) Arterial Line BP: (100-181)/(39-111) 167/91 (02/15 1000)  General - Well nourished, well developed, lethargic.  Ophthalmologic - Fundi not visualized due to noncooperation.  Cardiovascular - Regular rate and rhythm.  Mental Status -  Drowsy sleepy, barely open eyes on voice, did not answer questions for orientation. Spanish speaking only, moderate dysarthria, not cooperative on language exam.   Cranial Nerves II - XII - II - inconsistently blinking to visual threat due to lethargy. III, IV, VI - attending to both sides, but more right gaze preference but able to cross midline. V - Facial sensation intact bilaterally. VII - left significant facial droop. VIII - Hearing & vestibular intact bilaterally. X - Palate elevates symmetrically, moderate dysarthria. XI - Chin turning & shoulder shrug intact bilaterally. XII - Tongue protrusion intact.  Motor Strength - The patient's strength was normal in RUE and RLE, 0/5 LUE and 3-/5 LLE, on pain stimulation, 2/5 withdraw of LUE.  Bulk was normal and fasciculations were absent.   Motor Tone - Muscle tone was assessed at the neck and appendages and was decreased on the left.  Reflexes - The patient's reflexes were 1+ in all extremities and he had no pathological reflexes.  Sensory - Light  touch, temperature/pinprick were assessed and were symmetrical.    Coordination - not cooperative on exam.  Tremor was absent.  Gait and Station - not tested.   ASSESSMENT/PLAN Mr. Grant Garcia is a 51 y.o. male with  past medical history presenting with feeling weak all over. CT showed a right MCA infarct with large right penumbra.  He did not receive IV t-PA due to delay in arrival. 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   Stroke:  Patchy R MCA territory infarcts in setting of chronic R MCA stenosis/occlusion with collateral from ACA. Infarcts secondary to large vessel atherosclerosis and failed collateral flow.    Resultant  Left hemiparesis, left facial droop  MRI  Patchy R MCA territory infarct  MRA  Right M1 cut off  Cerebral angio - chronic right M1 occlusion with excellent collateral flow from right ACA  2D Echo  pending  LDL 134  HgbA1c pending  lovenox for VTE prophylaxis  Diet NPO time specified  No antithrombotic prior to admission, now on aspirin 325 mg daily and clopidogrel 75 mg daily  due to intracranial stenosis.   Patient counseled to be compliant with his antithrombotic medications  Ongoing aggressive stroke risk factor management  Therapy recommendations:  CIR  Disposition:  pending  Tobacco abuse  Current heavy smoker  Smoking cessation counseling will be provided  BP management  On the low side  Permissive hypertension (OK if < 220/120) but gradually normalize in 5-7 days  On IVF for BP   Long-term BP goal 130-150 due to right M1 occlusion  Hyperlipidemia  Home meds:  none  LDL 134, goal < 70  Add lipitor 80  Continue statin at discharge  Other Stroke Risk Factors  Suspect Obstructive sleep apnea - pt does snore during sleep but family not sure about apnea - will need outpt sleep study.  Other Active Problems  leukocystosis   Hospital day # 1  This patient is critically ill  due to right MCA infarct s/p cerebral angio with intent to treat, heavy smoker, left hemiparesis and at significant risk of neurological worsening, death form recurrent infarcts, hemorrhagic transformation, brain edema. This patient's care requires constant monitoring of vital signs, hemodynamics, respiratory and cardiac monitoring, review of multiple databases, neurological assessment, discussion with family, other specialists and medical decision making of high complexity. I spent 40 minutes of neurocritical care time in the care of this patient.  Marvel Plan, MD PhD Stroke Neurology 03/12/2016 4:12 PM   To contact Stroke Continuity provider, please refer to [email or web address]. After hours, contact General Neurology

## 2016-03-12 NOTE — Consult Note (Signed)
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 right handed Spanish speaking male on no prescription medications with history of tobacco abuse. Per chart review patient independent prior to admission living with family. 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 Presented to [facility] with left-sided weakness. Patient was transferred to [facility] for further evaluation. 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  Patient did not receive TPA. Neuro interventional radiology consulted underwent thrombectomy. Echocardiogram with ejection fraction of 55% no wall motion abnormalities. Neurology consulted currently on aspirin for CVA prophylaxis and workup ongoing. Subcutaneous Lovenox for DVT prophylaxis. Dysphagia #1 thin liquid diet. Physical therapy evaluation completed 03/12/2016 with recommendations of physical medicine rehabilitation consult.   Review of Systems  Unable to perform ROS: Language   No past medical history on file. Past Surgical History:  Procedure Laterality Date  . IR GENERIC HISTORICAL  03/11/2016   IR ANGIO VERTEBRAL SEL VERTEBRAL UNI R MOD SED 03/11/2016 [name], DO [facility]  . IR GENERIC HISTORICAL  03/11/2016   IR ANGIO INTRA EXTRACRAN SEL COM CAROTID INNOMINATE UNI L MOD SED 03/11/2016 [name], DO [facility]  . IR GENERIC HISTORICAL  03/11/2016   IR PERCUTANEOUS ART THROMBECTOMY/INFUSION INTRACRANIAL INC DIAG ANGIO 03/11/2016 [name], DO [facility]  . IR GENERIC HISTORICAL  03/11/2016   IR US GUIDE VASC ACCESS RIGHT 03/11/2016 [name], DO [facility]  . RADIOLOGY WITH ANESTHESIA N/A 03/11/2016   Procedure: RADIOLOGY WITH ANESTHESIA;   Surgeon: Medication Radiologist, MD;  Location: [facility] OR;  Service: Radiology;  Laterality: N/A;   No family history on file. Social History:  reports that he has been smoking Cigarettes.  He has a 60.00 pack-year smoking history. He does not have any smokeless tobacco history on file. His alcohol and drug histories are not on file. Allergies: No Known Allergies No prescriptions prior to admission.    Home: Home Living Family/patient expects to be discharged to:: Unsure Living Arrangements: Spouse/significant other, Children Available Help at Discharge: Family, Available 24 hours/day Type of Home: House Home Access: Level entry Home Layout: Able to live on main level with bedroom/bathroom Home Equipment: None  Functional History: Prior Function Level of Independence: Independent Comments: Pt works as Administrator. Functional Status:  Mobility: Bed Mobility Overal bed mobility: Needs Assistance Bed Mobility: Supine to Sit Supine to sit: Min assist, HOB elevated General bed mobility comments: Pt able to activate core and move BLE towards EOB, required minA for LUE and pelvic pad used for scooting. Slow and effortful to move L side.  Transfers Overall transfer level: Needs assistance Transfers: Stand Pivot Transfers, Sit to/from Stand Sit to Stand: Mod assist, +2 physical assistance Stand pivot transfers: +2 physical assistance, Mod assist General transfer comment: ModA +2 for sit-to-stand and stand pivot transfer; increased L knee instability with standing and L lateral lean      ADL:    Cognition: Cognition Overall Cognitive Status: Impaired/Different from baseline Orientation Level: Oriented to person, Oriented to place, Oriented to time, Disoriented to situation Cognition Arousal/Alertness: Lethargic Overall Cognitive Status: Impaired/Different from baseline Area of Impairment: Orientation, Following commands, Attention, Awareness Orientation Level: Disoriented to,  Place Current Attention Level: Focused (Progressing towards sustained) Following Commands:  Follows one step commands consistently, Follows multi-step commands with increased time Awareness: Intellectual (Progressing to emergent) General Comments: Pt A&Ox3, able to recall some aspects of home set-up which was clarified by daughter. Daughter states his speech is slower than usual.   Blood pressure 119/86, pulse 62, temperature 98.9 F (37.2 C), temperature source Oral, resp. rate (!) 21, height 5\' 6"  (1.676 m), weight 74.7 kg (164 lb 10.9 oz), SpO2 98 %. Physical Exam  Constitutional: He appears well-developed.  Eyes:  Pupils reactive to light  Neck: Normal range of motion. Neck supple. No thyromegaly present.  Cardiovascular: Normal rate and regular rhythm.   Respiratory: Effort normal and breath sounds normal. No respiratory distress.  GI: Soft. Bowel sounds are normal. He exhibits no distension.  Neurological:   Patient primarily Spanish-speaking. He did follow some simple verbal as well as motor commands. RUE and RLE motor 5/5. LUE and LLE grossly 0/5, early flexor tone RUE. Decreased sensation to LT/but is able to sense some pain. Drooling from left side of mouth during drinking--decrease oral/facial sensation on left and mild left central 7. Cognitively appears to have reasonable insight and awareness  Psychiatric: He has a normal mood and affect. His behavior is normal.    Results for orders placed or performed during the hospital encounter of 03/11/16 (from the past 24 hour(s))  Basic metabolic panel     Status: Abnormal   Collection Time: 03/11/16  4:23 PM  Result Value Ref Range   Sodium 141 135 - 145 mmol/L   Potassium 3.8 3.5 - 5.1 mmol/L   Chloride 109 101 - 111 mmol/L   CO2 23 22 - 32 mmol/L   Glucose, Bld 151 (H) 65 - 99 mg/dL   BUN 9 6 - 20 mg/dL   Creatinine, Ser 2.13 0.61 - 1.24 mg/dL   Calcium 8.8 (L) 8.9 - 10.3 mg/dL   GFR calc non Af Amer >60 >60 mL/min   GFR calc  Af Amer >60 >60 mL/min   Anion gap 9 5 - 15  Lipid panel     Status: Abnormal   Collection Time: 03/12/16  5:00 AM  Result Value Ref Range   Cholesterol 190 0 - 200 mg/dL   Triglycerides 086 <578 mg/dL   HDL 32 (L) >46 mg/dL   Total CHOL/HDL Ratio 5.9 RATIO   VLDL 24 0 - 40 mg/dL   LDL Cholesterol 962 (H) 0 - 99 mg/dL  CBC     Status: Abnormal   Collection Time: 03/12/16  5:00 AM  Result Value Ref Range   WBC 17.5 (H) 4.0 - 10.5 K/uL   RBC 4.56 4.22 - 5.81 MIL/uL   Hemoglobin 12.8 (L) 13.0 - 17.0 g/dL   HCT 95.2 (L) 84.1 - 32.4 %   MCV 84.9 78.0 - 100.0 fL   MCH 28.1 26.0 - 34.0 pg   MCHC 33.1 30.0 - 36.0 g/dL   RDW 40.1 02.7 - 25.3 %   Platelets 146 (L) 150 - 400 K/uL  Magnesium     Status: None   Collection Time: 03/12/16  5:00 AM  Result Value Ref Range   Magnesium 2.0 1.7 - 2.4 mg/dL  Phosphorus     Status: None   Collection Time: 03/12/16  5:00 AM  Result Value Ref Range   Phosphorus 3.6 2.5 - 4.6 mg/dL  Basic metabolic panel     Status: Abnormal   Collection Time: 03/12/16  5:00 AM  Result Value Ref Range   Sodium 139 135 -  145 mmol/L   Potassium 3.6 3.5 - 5.1 mmol/L   Chloride 105 101 - 111 mmol/L   CO2 26 22 - 32 mmol/L   Glucose, Bld 109 (H) 65 - 99 mg/dL   BUN 12 6 - 20 mg/dL   Creatinine, Ser 1.61 0.61 - 1.24 mg/dL   Calcium 8.9 8.9 - 09.6 mg/dL   GFR calc non Af Amer >60 >60 mL/min   GFR calc Af Amer >60 >60 mL/min   Anion gap 8 5 - 15   Ct Head Wo Contrast  Result Date: 03/11/2016 CLINICAL DATA:  Status post mechanical thrombectomy. EXAM: CT HEAD WITHOUT CONTRAST TECHNIQUE: Contiguous axial images were obtained from the base of the skull through the vertex without intravenous contrast. COMPARISON:  Head CT from earlier today FINDINGS: Brain: Stable perforator type infarct in the right basal ganglia and internal capsule. No evidence of infarct progression. No acute hemorrhage. No hydrocephalus or mass. Vascular: Reason intravenous contrast. No detectable  asymmetric enhancement. Skull: Remote craniotomy on the right for hematoma evacuation. Sinuses/Orbits: Nasopharyngeal fluid in the setting of intubation. IMPRESSION: Stable postprocedural CT. No progression of the lenticulostriate infarct and no acute hemorrhage. Electronically Signed   By: Marnee Spring M.D.   On: 03/11/2016 12:41   Mr Brain Wo Contrast  Result Date: 03/12/2016 CLINICAL DATA:  Known RIGHT M1 occlusion, follow-up stroke. EXAM: MRI HEAD WITHOUT CONTRAST MRA HEAD WITHOUT CONTRAST TECHNIQUE: Multiplanar, multiecho pulse sequences of the brain and surrounding structures were obtained without intravenous contrast. Angiographic images of the head were obtained using MRA technique without contrast. Patient was unable to tolerate further imaging, coronal T2 not seen. COMPARISON:  CT HEAD March 11, 2016 FINDINGS: MRI HEAD FINDINGS- mildly motion degraded examination. BRAIN: Patchy reduced diffusion RIGHT basal ganglia, RIGHT frontotemporal parietal lobes with corresponding low ADC values faint clear T2 hyperintense signal. No susceptibility artifact to suggest hemorrhage. No susceptibility artifact to suggest hemorrhage. The ventricles and sulci are normal for patient's age. No suspicious parenchymal signal, masses or mass effect. No abnormal extra-axial fluid collections. VASCULAR: Normal major intracranial vascular flow voids present at skull base. SKULL AND UPPER CERVICAL SPINE: No abnormal sellar expansion. No suspicious calvarial bone marrow signal. Craniocervical junction maintained. SINUSES/ORBITS: The mastoid air-cells and included paranasal sinuses are well-aerated. The included ocular globes and orbital contents are non-suspicious. OTHER: None. MRA HEAD FINDINGS- moderately motion degraded ANTERIOR CIRCULATION: Flow related enhancement bilateral internal carotid arteries, apparent mild narrowing RIGHT cavernous internal carotid artery, the vessel is patent on prior CT and this is  attributable to calcific atherosclerosis. Chronically occluded proximal RIGHT M1 segments, no collateral vessels identified on time-of-flight MRA. LEFT middle cerebral artery and bilateral anterior cerebral arteries are patent. POSTERIOR CIRCULATION: Bilateral vertebral arteries, basilar artery are patent. Limited assessment of the main branch vessels due to patient motion. Patent bilateral posterior cerebral arteries. Small RIGHT posterior communicating artery present as seen on prior CTA. ANATOMIC VARIANTS: None. 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 No hemorrhage. MRA HEAD: Moderately motion degraded examination. RIGHT M1 occlusion, no collateral vessels by time-of-flight technique. Electronically Signed   By: Awilda Metro M.D.   On: 03/12/2016 05:07   Ir US Guide Vasc Access Right  Result Date: 03/11/2016 INDICATION: 51 year old male presenting with acute left-sided symptoms and imaging evidence of right MCA emergent large vessel occlusion. CT imaging demonstrates normal aspects score, with CT perfusion shadowing elevated mean transit time with potential tissue at risk and no core infarct. EXAM: IR PERCUTANEOUS  ART THORMBECTOMY/INFUSION INTRACRANIAL INCLUDE DIAG ANGIO; IR ANGIO VERTEBRAL SEL VERTEBRAL UNI RIGHT MOD SED; IR ANGIO INTRA EXTRACRAN SEL COM CAROTID INNOMINATE UNI LEFT MOD SED; IR ULTRASOUND GUIDANCE VASC ACCESS RIGHT COMPARISON:  No prior CT angiogram MEDICATIONS: 2.0 g Ancef. The antibiotic was administered within 1 hour of the procedure ANESTHESIA/SEDATION: Anesthesia team provided general anesthesia CONTRAST:  160 cc Isovue-300 FLUOROSCOPY TIME:  Fluoroscopy Time: 24 minutes 36 seconds (533 mGy). COMPLICATIONS: None TECHNIQUE: Informed written consent was obtained from the patient and the patient's family after a thorough discussion of the procedural risks, benefits and alternatives. Specific risks discussed include: Bleeding, infection, contrast  reaction, kidney injury/failure, need for further procedure/surgery, arterial injury or dissection, embolization to new territory, intracranial hemorrhage (10-15% risk), neurologic deterioration, cardiopulmonary collapse, death. All questions were addressed. Maximal Sterile Barrier Technique was utilized including during the procedure including caps, mask, sterile gowns, sterile gloves, sterile drape, hand hygiene and skin antiseptic. A timeout was performed prior to the initiation of the procedure. Ultrasound survey of the right inguinal region was performed with images stored and sent to PACs. 11 blade scalpel was used to make a small incision. A micropuncture needle was used access the right common femoral artery under ultrasound. With excellent arterial blood flow returned, an .018 micro wire was passed through the needle, observed to enter the abdominal aorta under fluoroscopy. The needle was removed, and a micropuncture sheath was placed over the wire. The inner dilator and wire were removed, and an 035 Bentson wire was advanced under fluoroscopy into the abdominal aorta. The sheath was removed and a standard 5 [place] vascular sheath was placed. The dilator was removed and the sheath was flushed. A 49F JB-1 diagnostic catheter was advanced over the wire to the proximal descending thoracic aorta. Wire was then removed. Double flush of the catheter was performed. Catheter was then used to select the innominate artery. Angiogram was performed. Using roadmap technique, the catheter was advanced over a roadrunner wire into cervical ICA. Formal angiogram was performed. Exchange length Rosen wire was then passed through the diagnostic catheter to the distal cervical ICA and the diagnostic catheter was removed. The 5 French sheath was removed and exchanged for 8 French 55 centimeter BrightTip sheath. Sheath was flushed and attached to pressurized and heparinized saline bag for constant forward flow. Then a neuron Max  85cm catheter was prepared on the back table. Ace 68 intermediate catheter was then loaded though the Neuron Max catheter and advanced over the [facility] wire to the internal carotid artery. Wire was removed, and roadmap angiogram was performed. Microcatheter system was then introduced through the Ace catheter using a synchro soft 014 wire and a Trevo Provue18 catheter. Microcatheter system was advanced into the internal carotid artery, to the level of the occlusion. The micro wire was then carefully advanced through the occluded segment. Microcatheter was then push through the occluded segment and the wire was removed. Ace catheter was advanced over the micro wire to the ophthalmic ICA segment. Blood was then aspirated through the hub of the microcatheter, and a gentle contrast injection was performed confirming intraluminal position. A rotating hemostatic valve was then attached to the back end of the microcatheter, and a pressurized and heparinized saline bag was attached to the catheter. 4 x 40 solitaire device was then selected. Back flush was achieved at the rotating hemostatic valve, and then the device was gently advanced through the microcatheter to the distal end. The retriever was then unsheathed by withdrawing the microcatheter  under fluoroscopy. Once the retriever was completely unsheathed, control angiogram was performed from the balloon catheter. Constant aspiration was then performed through the Ace catheter as the retriever was gently and slowly withdrawn with fluoroscopic observation. Once the retriever was entirely removed from the system, free aspiration was confirmed at the hub of the intermediate catheter, with free blood return confirmed. Control angiogram was then performed. Persistent occlusion at the proximal MCA was confirmed. The microcatheter system was then advanced through the Ace catheter. On the 2nd attempt, a lateral view was required in order to guide the microcatheter system beyond  the ophthalmic artery origin. Once the micro wire microcatheter were beyond the occlusion, the micro wire was removed and solitaire 4 x 40 device was deployed. After the solitaire was deployed across the occlusion, the Ace catheter was advanced to the M1 segment at the site of the occlusion. Local aspiration was performed upon withdrawal of the solitaire device under fluoroscopic observation. Once the retrieve her was entirely removed from the system, free aspiration was confirmed at the hub of the intermediate catheter, with free blood return confirmed. Control angiogram was again performed. Review of the available imaging was performed at this time. The intermediate catheter was removed and Neuro Max catheter were removed. Diagnostic imaging was performed at the right vert origin. Angiogram of the left carotid system was also performed including intracranial images. Catheter was removed, and the bright tip sheath was exchanged for a short 8 French sheath at the right common femoral artery. Control angiogram was performed at the right common femoral artery puncture site. After the angiogram, right common femoral sheath was removed with deployment of Exoseal device. Patient tolerated the procedure well and remained hemodynamically stable throughout. No complications were encountered. Estimated blood loss approximately 50 cc. FINDINGS: Baseline angiogram Right common carotid artery:  Normal course caliber and contour. Right external carotid artery: Patent with antegrade flow. Significant flow through the superficial temporal branch, potentially contributing to collateral flow intracranial. Right internal carotid artery: Normal course caliber and contour of the cervical portion. Vertical and petrous segment patent with normal course caliber contour. Cavernous segment patent. Clinoid segment patent. Antegrade flow of the ophthalmic artery. Ophthalmic segment patent. Terminus patent. Right MCA: Proximal occlusion of the  middle cerebral artery. There is excellent collateral flow to the right hemisphere VA anterior cerebral artery. Right ACA: A 1 segment patent. A 2 segment perfuses the right territory. Baseline perfusion TICI 1, with excellent collateral perfusion. After the 1st pass, there is no significant improvement through the occlusion, with persistent TICI 1. After the 2nd pass, there is very minimal improvement in flow through the occluded segment, with persisting TICI 1 flow, and excellent collateral flow maintained. Vertebral origin injection demonstrates normal course contour and caliber of the right vertebral artery, with patent V3 segment. Cross flow from the left vertebral artery with partial washout in the basilar artery. Patent posterior inferior cerebellar artery, anterior inferior cerebellar artery, and superior cerebral artery. Left carotid angiogram demonstrates normal course caliber and contour of the left CCA and cervical ICA. Patent external carotid artery. Suggestion of mild atherosclerotic changes at the origin of the A1 segment, with patent 82 segment. Patent middle cerebral artery with typical appearance of the arterial, late arterial, capillary and venous phase. IMPRESSION: Status post cerebral angiogram with attempted mechanical thrombectomy of right MCA occlusion. After 2 passes with local aspiration and stentreiver technology, there is persistent occlusion and TICI 1 flow. Excellent collateral flow perfusing the right MCA territory. My  impression is that there is likely underlying stenosis at the site of occlusion given the collateral flow, and that further manipulation may cause dissection or hemorrhage. Given the excellent collateral flow and absence of core infarction on the perfusion imaging, we elected to defer further attempts at thrombectomy. Deployment of Exoseal for hemostasis. Signed, Yvone Neu. Loreta Ave, DO [facility] Radiology Electronically  Signed   By: [name] D.O.   On: 03/11/2016 13:10   Ct Cerebral Perfusion W Contrast  Result Date: 03/11/2016 CLINICAL DATA:  51 year old male with right MCA M1 occlusion on CTA and only small lacunar type infarct in the right lentiform evident by CT since 05/1936 4 hours today. Initial encounter. EXAM: CT PERFUSION BRAIN TECHNIQUE: Multiphase CT imaging of the brain was performed following IV bolus contrast injection. Subsequent parametric perfusion maps were calculated using RAPID software. CONTRAST:  50 mL Isovue 370 COMPARISON:  CTA head and neck performed at 0640 hours today. Head CTs earlier today. FINDINGS: CT Brain Perfusion Findings: CBF (<30%) Volume:  0 mL Perfusion (Tmax>6.0s) volume: 118 mL mL, corresponding to the right MCA territory Mismatch Volume:  " Infinite" Infarction Location:Noncontrast head CT suggests there is a small core infarct in the right lentiform nuclei on this patient, but this is not detected using the perfusion thresholds for this exam. IMPRESSION: Large area of right MCA territory penumbra. Clinically suspected small volume core infarct in the right lentiform not detected. Overall very favorable CTP characteristics for endovascular reperfusion. I am advised that they are prepping for Neurointervention at the time of this dictation. Electronically Signed   By: Odessa Fleming M.D.   On: 03/11/2016 09:27   Portable Chest Xray  Result Date: 03/12/2016 CLINICAL DATA:  Respiratory failure.  Stroke. EXAM: PORTABLE CHEST 1 VIEW COMPARISON:  03/11/2016. FINDINGS: Interim extubation. Mild narrowing of the upper trachea noted. Trachea stenosis or paratracheal process cannot be excluded. Mediastinum hilar structures normal. Cardiomegaly. Mild pulmonary venous congestion. No focal infiltrate. No pleural effusion or pneumothorax. No acute bony abnormality . IMPRESSION: 1. Interim extubation. Mild narrowing of the upper trachea noted. Tracheal stenosis or peritracheal process cannot be  excluded . 2. Cardiomegaly with mild pulmonary venous congestion. No overt pulmonary edema. Electronically Signed   By: Maisie Fus  Register   On: 03/12/2016 07:25   Portable Chest Xray  Result Date: 03/11/2016 CLINICAL DATA:  Hypoxia EXAM: PORTABLE CHEST 1 VIEW COMPARISON:  None. FINDINGS: Endotracheal tube tip is 2.9 cm above the carina. No pneumothorax. There is no edema or consolidation. Heart size and pulmonary vascularity are normal. No adenopathy. No bone lesions. IMPRESSION: Endotracheal tube as described without pneumothorax. No edema or consolidation. Electronically Signed   By: Bretta Bang III M.D.   On: 03/11/2016 15:56   Mr Maxine Glenn Head/brain ZO Cm  Result Date: 03/12/2016 CLINICAL DATA:  Known RIGHT M1 occlusion, follow-up stroke. EXAM: MRI HEAD WITHOUT CONTRAST MRA HEAD WITHOUT CONTRAST TECHNIQUE: Multiplanar, multiecho pulse sequences of the brain and surrounding structures were obtained without intravenous contrast. Angiographic images of the head were obtained using MRA technique without contrast. Patient was unable to tolerate further imaging, coronal T2 not seen. COMPARISON:  CT HEAD March 11, 2016 FINDINGS: MRI HEAD FINDINGS- mildly motion degraded examination. BRAIN: Patchy reduced diffusion RIGHT basal ganglia, RIGHT frontotemporal parietal lobes with corresponding low ADC values faint clear T2 hyperintense signal. No susceptibility artifact to suggest hemorrhage. No susceptibility artifact to suggest hemorrhage. The ventricles and sulci are normal for patient's age. No suspicious  parenchymal signal, masses or mass effect. No abnormal extra-axial fluid collections. VASCULAR: Normal major intracranial vascular flow voids present at skull base. SKULL AND UPPER CERVICAL SPINE: No abnormal sellar expansion. No suspicious calvarial bone marrow signal. Craniocervical junction maintained. SINUSES/ORBITS: The mastoid air-cells and included paranasal sinuses are well-aerated. The included  ocular globes and orbital contents are non-suspicious. OTHER: None. MRA HEAD FINDINGS- moderately motion degraded ANTERIOR CIRCULATION: Flow related enhancement bilateral internal carotid arteries, apparent mild narrowing RIGHT cavernous internal carotid artery, the vessel is patent on prior CT and this is attributable to calcific atherosclerosis. Chronically occluded proximal RIGHT M1 segments, no collateral vessels identified on time-of-flight MRA. LEFT middle cerebral artery and bilateral anterior cerebral arteries are patent. POSTERIOR CIRCULATION: Bilateral vertebral arteries, basilar artery are patent. Limited assessment of the main branch vessels due to patient motion. Patent bilateral posterior cerebral arteries. Small RIGHT posterior communicating artery present as seen on prior CTA. ANATOMIC VARIANTS: None. 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 No hemorrhage. MRA HEAD: Moderately motion degraded examination. RIGHT M1 occlusion, no collateral vessels by time-of-flight technique. Electronically Signed   By: Awilda Metroourtnay  Bloomer M.D.   On: 03/12/2016 05:07   Ir Percutaneous Art Thrombectomy/infusion Intracranial Inc Diag Angio  Result Date: 03/11/2016 INDICATION: 51 year old male presenting with acute left-sided symptoms and imaging evidence of right MCA emergent large vessel occlusion. CT imaging demonstrates normal aspects score, with CT perfusion shadowing elevated mean transit time with potential tissue at risk and no core infarct. EXAM: IR PERCUTANEOUS ART THORMBECTOMY/INFUSION INTRACRANIAL INCLUDE DIAG ANGIO; IR ANGIO VERTEBRAL SEL VERTEBRAL UNI RIGHT MOD SED; IR ANGIO INTRA EXTRACRAN SEL COM CAROTID INNOMINATE UNI LEFT MOD SED; IR ULTRASOUND GUIDANCE VASC ACCESS RIGHT COMPARISON:  No prior CT angiogram MEDICATIONS: 2.0 g Ancef. The antibiotic was administered within 1 hour of the procedure ANESTHESIA/SEDATION: Anesthesia team provided general anesthesia CONTRAST:   160 cc Isovue-300 FLUOROSCOPY TIME:  Fluoroscopy Time: 24 minutes 36 seconds (533 mGy). COMPLICATIONS: None TECHNIQUE: Informed written consent was obtained from the patient and the patient's family after a thorough discussion of the procedural risks, benefits and alternatives. Specific risks discussed include: Bleeding, infection, contrast reaction, kidney injury/failure, need for further procedure/surgery, arterial injury or dissection, embolization to new territory, intracranial hemorrhage (10-15% risk), neurologic deterioration, cardiopulmonary collapse, death. All questions were addressed. Maximal Sterile Barrier Technique was utilized including during the procedure including caps, mask, sterile gowns, sterile gloves, sterile drape, hand hygiene and skin antiseptic. A timeout was performed prior to the initiation of the procedure. Ultrasound survey of the right inguinal region was performed with images stored and sent to PACs. 11 blade scalpel was used to make a small incision. A micropuncture needle was used access the right common femoral artery under ultrasound. With excellent arterial blood flow returned, an .018 micro wire was passed through the needle, observed to enter the abdominal aorta under fluoroscopy. The needle was removed, and a micropuncture sheath was placed over the wire. The inner dilator and wire were removed, and an 035 Bentson wire was advanced under fluoroscopy into the abdominal aorta. The sheath was removed and a standard 5 [place] vascular sheath was placed. The dilator was removed and the sheath was flushed. A 62F JB-1 diagnostic catheter was advanced over the wire to the proximal descending thoracic aorta. Wire was then removed. Double flush of the catheter was performed. Catheter was then used to select the innominate artery. Angiogram was performed. Using roadmap technique, the catheter was advanced over a roadrunner wire  into cervical ICA. Formal angiogram was performed. Exchange  length Rosen wire was then passed through the diagnostic catheter to the distal cervical ICA and the diagnostic catheter was removed. The 5 French sheath was removed and exchanged for 8 French 55 centimeter BrightTip sheath. Sheath was flushed and attached to pressurized and heparinized saline bag for constant forward flow. Then a neuron Max 85cm catheter was prepared on the back table. Ace 68 intermediate catheter was then loaded though the Neuron Max catheter and advanced over the [facility] wire to the internal carotid artery. Wire was removed, and roadmap angiogram was performed. Microcatheter system was then introduced through the Ace catheter using a synchro soft 014 wire and a Trevo Provue18 catheter. Microcatheter system was advanced into the internal carotid artery, to the level of the occlusion. The micro wire was then carefully advanced through the occluded segment. Microcatheter was then push through the occluded segment and the wire was removed. Ace catheter was advanced over the micro wire to the ophthalmic ICA segment. Blood was then aspirated through the hub of the microcatheter, and a gentle contrast injection was performed confirming intraluminal position. A rotating hemostatic valve was then attached to the back end of the microcatheter, and a pressurized and heparinized saline bag was attached to the catheter. 4 x 40 solitaire device was then selected. Back flush was achieved at the rotating hemostatic valve, and then the device was gently advanced through the microcatheter to the distal end. The retriever was then unsheathed by withdrawing the microcatheter under fluoroscopy. Once the retriever was completely unsheathed, control angiogram was performed from the balloon catheter. Constant aspiration was then performed through the Ace catheter as the retriever was gently and slowly withdrawn with fluoroscopic observation. Once the retriever was entirely removed from the system, free aspiration was  confirmed at the hub of the intermediate catheter, with free blood return confirmed. Control angiogram was then performed. Persistent occlusion at the proximal MCA was confirmed. The microcatheter system was then advanced through the Ace catheter. On the 2nd attempt, a lateral view was required in order to guide the microcatheter system beyond the ophthalmic artery origin. Once the micro wire microcatheter were beyond the occlusion, the micro wire was removed and solitaire 4 x 40 device was deployed. After the solitaire was deployed across the occlusion, the Ace catheter was advanced to the M1 segment at the site of the occlusion. Local aspiration was performed upon withdrawal of the solitaire device under fluoroscopic observation. Once the retrieve her was entirely removed from the system, free aspiration was confirmed at the hub of the intermediate catheter, with free blood return confirmed. Control angiogram was again performed. Review of the available imaging was performed at this time. The intermediate catheter was removed and Neuro Max catheter were removed. Diagnostic imaging was performed at the right vert origin. Angiogram of the left carotid system was also performed including intracranial images. Catheter was removed, and the bright tip sheath was exchanged for a short 8 French sheath at the right common femoral artery. Control angiogram was performed at the right common femoral artery puncture site. After the angiogram, right common femoral sheath was removed with deployment of Exoseal device. Patient tolerated the procedure well and remained hemodynamically stable throughout. No complications were encountered. Estimated blood loss approximately 50 cc. FINDINGS: Baseline angiogram Right common carotid artery:  Normal course caliber and contour. Right external carotid artery: Patent with antegrade flow. Significant flow through the superficial temporal branch, potentially contributing to collateral flow  intracranial. Right internal carotid artery: Normal course caliber and contour of the cervical portion. Vertical and petrous segment patent with normal course caliber contour. Cavernous segment patent. Clinoid segment patent. Antegrade flow of the ophthalmic artery. Ophthalmic segment patent. Terminus patent. Right MCA: Proximal occlusion of the middle cerebral artery. There is excellent collateral flow to the right hemisphere VA anterior cerebral artery. Right ACA: A 1 segment patent. A 2 segment perfuses the right territory. Baseline perfusion TICI 1, with excellent collateral perfusion. After the 1st pass, there is no significant improvement through the occlusion, with persistent TICI 1. After the 2nd pass, there is very minimal improvement in flow through the occluded segment, with persisting TICI 1 flow, and excellent collateral flow maintained. Vertebral origin injection demonstrates normal course contour and caliber of the right vertebral artery, with patent V3 segment. Cross flow from the left vertebral artery with partial washout in the basilar artery. Patent posterior inferior cerebellar artery, anterior inferior cerebellar artery, and superior cerebral artery. Left carotid angiogram demonstrates normal course caliber and contour of the left CCA and cervical ICA. Patent external carotid artery. Suggestion of mild atherosclerotic changes at the origin of the A1 segment, with patent 82 segment. Patent middle cerebral artery with typical appearance of the arterial, late arterial, capillary and venous phase. IMPRESSION: Status post cerebral angiogram with attempted mechanical thrombectomy of right MCA occlusion. After 2 passes with local aspiration and stentreiver technology, there is persistent occlusion and TICI 1 flow. Excellent collateral flow perfusing the right MCA territory. My impression is that there is likely underlying stenosis at the site of occlusion given the collateral flow, and that further  manipulation may cause dissection or hemorrhage. Given the excellent collateral flow and absence of core infarction on the perfusion imaging, we elected to defer further attempts at thrombectomy. Deployment of Exoseal for hemostasis. Signed, Yvone Neu. Loreta Ave, DO [facility] Radiology Electronically Signed   By: [name] D.O.   On: 03/11/2016 13:10   Ct Head Code Stroke Wo Contrast`  Result Date: 03/11/2016 CLINICAL DATA:  Code stroke. 51 year old male with right MCA M1 occlusion on CTA head and neck for evaluation of 2 days of left side weakness. Initial encounter. EXAM: CT HEAD WITHOUT CONTRAST TECHNIQUE: Contiguous axial images were obtained from the base of the skull through the vertex without intravenous contrast. COMPARISON:  CTA 0640 hours today. Head CT without contrast 0544 hours today. FINDINGS: Brain: There is stable hypodensity in the right lentiform nuclei, but otherwise no cytotoxic edema is evident in the right MCA territory. Gray-white matter differentiation appears stable No acute intracranial hemorrhage identified. No midline shift, mass effect, or evidence of intracranial mass lesion. No ventriculomegaly. Vascular: Mild residual intravascular contrast. Skull: Previous right frontotemporal craniotomy. No acute osseous abnormality identified. Sinuses/Orbits: Stable. Well pneumatized in general. Chronic left lamina papyracea fracture. Other: No acute orbit or scalp soft tissue findings. ASPECTS ([place] Stroke Program Early CT Score) - Ganglionic level infarction (caudate, lentiform nuclei, internal capsule, insula, M1-M3 cortex): 6 (minus 1 for L) - Supraganglionic infarction (M4-M6 cortex): 3 Total score (0-10 with 10 being normal): 9 IMPRESSION: 1. Stable noncontrast CT appearance of the brain since 0544 hours today: Hypodensity in the right lentiform nucleus, but no other CT changes of right MCA infarct. No acute intracranial hemorrhage. 2.  ASPECTS is 9. 3. I was advised that Dr. Ruthy Dick was discussing the findings on this study with Neurology at 0916 hours. Electronically Signed   By: Rexene Edison  Margo Aye M.D.   On: 03/11/2016 09:22   Ir Angio Intra Extracran Sel Com Carotid Innominate Uni L Mod Sed  Result Date: 03/11/2016 INDICATION: 51 year old male presenting with acute left-sided symptoms and imaging evidence of right MCA emergent large vessel occlusion. CT imaging demonstrates normal aspects score, with CT perfusion shadowing elevated mean transit time with potential tissue at risk and no core infarct. EXAM: IR PERCUTANEOUS ART THORMBECTOMY/INFUSION INTRACRANIAL INCLUDE DIAG ANGIO; IR ANGIO VERTEBRAL SEL VERTEBRAL UNI RIGHT MOD SED; IR ANGIO INTRA EXTRACRAN SEL COM CAROTID INNOMINATE UNI LEFT MOD SED; IR ULTRASOUND GUIDANCE VASC ACCESS RIGHT COMPARISON:  No prior CT angiogram MEDICATIONS: 2.0 g Ancef. The antibiotic was administered within 1 hour of the procedure ANESTHESIA/SEDATION: Anesthesia team provided general anesthesia CONTRAST:  160 cc Isovue-300 FLUOROSCOPY TIME:  Fluoroscopy Time: 24 minutes 36 seconds (533 mGy). COMPLICATIONS: None TECHNIQUE: Informed written consent was obtained from the patient and the patient's family after a thorough discussion of the procedural risks, benefits and alternatives. Specific risks discussed include: Bleeding, infection, contrast reaction, kidney injury/failure, need for further procedure/surgery, arterial injury or dissection, embolization to new territory, intracranial hemorrhage (10-15% risk), neurologic deterioration, cardiopulmonary collapse, death. All questions were addressed. Maximal Sterile Barrier Technique was utilized including during the procedure including caps, mask, sterile gowns, sterile gloves, sterile drape, hand hygiene and skin antiseptic. A timeout was performed prior to the initiation of the procedure. Ultrasound survey of the right inguinal region was performed with images stored  and sent to PACs. 11 blade scalpel was used to make a small incision. A micropuncture needle was used access the right common femoral artery under ultrasound. With excellent arterial blood flow returned, an .018 micro wire was passed through the needle, observed to enter the abdominal aorta under fluoroscopy. The needle was removed, and a micropuncture sheath was placed over the wire. The inner dilator and wire were removed, and an 035 Bentson wire was advanced under fluoroscopy into the abdominal aorta. The sheath was removed and a standard 5 [place] vascular sheath was placed. The dilator was removed and the sheath was flushed. A 50F JB-1 diagnostic catheter was advanced over the wire to the proximal descending thoracic aorta. Wire was then removed. Double flush of the catheter was performed. Catheter was then used to select the innominate artery. Angiogram was performed. Using roadmap technique, the catheter was advanced over a roadrunner wire into cervical ICA. Formal angiogram was performed. Exchange length Rosen wire was then passed through the diagnostic catheter to the distal cervical ICA and the diagnostic catheter was removed. The 5 French sheath was removed and exchanged for 8 French 55 centimeter BrightTip sheath. Sheath was flushed and attached to pressurized and heparinized saline bag for constant forward flow. Then a neuron Max 85cm catheter was prepared on the back table. Ace 68 intermediate catheter was then loaded though the Neuron Max catheter and advanced over the [facility] wire to the internal carotid artery. Wire was removed, and roadmap angiogram was performed. Microcatheter system was then introduced through the Ace catheter using a synchro soft 014 wire and a Trevo Provue18 catheter. Microcatheter system was advanced into the internal carotid artery, to the level of the occlusion. The micro wire was then carefully advanced through the occluded segment. Microcatheter was then push through the  occluded segment and the wire was removed. Ace catheter was advanced over the micro wire to the ophthalmic ICA segment. Blood was then aspirated through the hub of the microcatheter, and a gentle contrast injection was performed  confirming intraluminal position. A rotating hemostatic valve was then attached to the back end of the microcatheter, and a pressurized and heparinized saline bag was attached to the catheter. 4 x 40 solitaire device was then selected. Back flush was achieved at the rotating hemostatic valve, and then the device was gently advanced through the microcatheter to the distal end. The retriever was then unsheathed by withdrawing the microcatheter under fluoroscopy. Once the retriever was completely unsheathed, control angiogram was performed from the balloon catheter. Constant aspiration was then performed through the Ace catheter as the retriever was gently and slowly withdrawn with fluoroscopic observation. Once the retriever was entirely removed from the system, free aspiration was confirmed at the hub of the intermediate catheter, with free blood return confirmed. Control angiogram was then performed. Persistent occlusion at the proximal MCA was confirmed. The microcatheter system was then advanced through the Ace catheter. On the 2nd attempt, a lateral view was required in order to guide the microcatheter system beyond the ophthalmic artery origin. Once the micro wire microcatheter were beyond the occlusion, the micro wire was removed and solitaire 4 x 40 device was deployed. After the solitaire was deployed across the occlusion, the Ace catheter was advanced to the M1 segment at the site of the occlusion. Local aspiration was performed upon withdrawal of the solitaire device under fluoroscopic observation. Once the retrieve her was entirely removed from the system, free aspiration was confirmed at the hub of the intermediate catheter, with free blood return confirmed. Control angiogram was  again performed. Review of the available imaging was performed at this time. The intermediate catheter was removed and Neuro Max catheter were removed. Diagnostic imaging was performed at the right vert origin. Angiogram of the left carotid system was also performed including intracranial images. Catheter was removed, and the bright tip sheath was exchanged for a short 8 French sheath at the right common femoral artery. Control angiogram was performed at the right common femoral artery puncture site. After the angiogram, right common femoral sheath was removed with deployment of Exoseal device. Patient tolerated the procedure well and remained hemodynamically stable throughout. No complications were encountered. Estimated blood loss approximately 50 cc. FINDINGS: Baseline angiogram Right common carotid artery:  Normal course caliber and contour. Right external carotid artery: Patent with antegrade flow. Significant flow through the superficial temporal branch, potentially contributing to collateral flow intracranial. Right internal carotid artery: Normal course caliber and contour of the cervical portion. Vertical and petrous segment patent with normal course caliber contour. Cavernous segment patent. Clinoid segment patent. Antegrade flow of the ophthalmic artery. Ophthalmic segment patent. Terminus patent. Right MCA: Proximal occlusion of the middle cerebral artery. There is excellent collateral flow to the right hemisphere VA anterior cerebral artery. Right ACA: A 1 segment patent. A 2 segment perfuses the right territory. Baseline perfusion TICI 1, with excellent collateral perfusion. After the 1st pass, there is no significant improvement through the occlusion, with persistent TICI 1. After the 2nd pass, there is very minimal improvement in flow through the occluded segment, with persisting TICI 1 flow, and excellent collateral flow maintained. Vertebral origin injection demonstrates normal course contour and  caliber of the right vertebral artery, with patent V3 segment. Cross flow from the left vertebral artery with partial washout in the basilar artery. Patent posterior inferior cerebellar artery, anterior inferior cerebellar artery, and superior cerebral artery. Left carotid angiogram demonstrates normal course caliber and contour of the left CCA and cervical ICA. Patent external carotid artery. Suggestion of  mild atherosclerotic changes at the origin of the A1 segment, with patent 82 segment. Patent middle cerebral artery with typical appearance of the arterial, late arterial, capillary and venous phase. IMPRESSION: Status post cerebral angiogram with attempted mechanical thrombectomy of right MCA occlusion. After 2 passes with local aspiration and stentreiver technology, there is persistent occlusion and TICI 1 flow. Excellent collateral flow perfusing the right MCA territory. My impression is that there is likely underlying stenosis at the site of occlusion given the collateral flow, and that further manipulation may cause dissection or hemorrhage. Given the excellent collateral flow and absence of core infarction on the perfusion imaging, we elected to defer further attempts at thrombectomy. Deployment of Exoseal for hemostasis. Signed, Yvone Neu. Loreta Ave, DO [facility] Radiology Electronically Signed   By: [name] D.O.   On: 03/11/2016 13:10   Ir Angio Vertebral Sel Vertebral Uni R Mod Sed  Result Date: 03/11/2016 INDICATION: 51 year old male presenting with acute left-sided symptoms and imaging evidence of right MCA emergent large vessel occlusion. CT imaging demonstrates normal aspects score, with CT perfusion shadowing elevated mean transit time with potential tissue at risk and no core infarct. EXAM: IR PERCUTANEOUS ART THORMBECTOMY/INFUSION INTRACRANIAL INCLUDE DIAG ANGIO; IR ANGIO VERTEBRAL SEL VERTEBRAL UNI RIGHT MOD SED; IR ANGIO INTRA EXTRACRAN SEL  COM CAROTID INNOMINATE UNI LEFT MOD SED; IR ULTRASOUND GUIDANCE VASC ACCESS RIGHT COMPARISON:  No prior CT angiogram MEDICATIONS: 2.0 g Ancef. The antibiotic was administered within 1 hour of the procedure ANESTHESIA/SEDATION: Anesthesia team provided general anesthesia CONTRAST:  160 cc Isovue-300 FLUOROSCOPY TIME:  Fluoroscopy Time: 24 minutes 36 seconds (533 mGy). COMPLICATIONS: None TECHNIQUE: Informed written consent was obtained from the patient and the patient's family after a thorough discussion of the procedural risks, benefits and alternatives. Specific risks discussed include: Bleeding, infection, contrast reaction, kidney injury/failure, need for further procedure/surgery, arterial injury or dissection, embolization to new territory, intracranial hemorrhage (10-15% risk), neurologic deterioration, cardiopulmonary collapse, death. All questions were addressed. Maximal Sterile Barrier Technique was utilized including during the procedure including caps, mask, sterile gowns, sterile gloves, sterile drape, hand hygiene and skin antiseptic. A timeout was performed prior to the initiation of the procedure. Ultrasound survey of the right inguinal region was performed with images stored and sent to PACs. 11 blade scalpel was used to make a small incision. A micropuncture needle was used access the right common femoral artery under ultrasound. With excellent arterial blood flow returned, an .018 micro wire was passed through the needle, observed to enter the abdominal aorta under fluoroscopy. The needle was removed, and a micropuncture sheath was placed over the wire. The inner dilator and wire were removed, and an 035 Bentson wire was advanced under fluoroscopy into the abdominal aorta. The sheath was removed and a standard 5 [place] vascular sheath was placed. The dilator was removed and the sheath was flushed. A 21F JB-1 diagnostic catheter was advanced over the wire to the proximal descending thoracic aorta.  Wire was then removed. Double flush of the catheter was performed. Catheter was then used to select the innominate artery. Angiogram was performed. Using roadmap technique, the catheter was advanced over a roadrunner wire into cervical ICA. Formal angiogram was performed. Exchange length Rosen wire was then passed through the diagnostic catheter to the distal cervical ICA and the diagnostic catheter was removed. The 5 French sheath was removed and exchanged for 8 French 55 centimeter BrightTip sheath. Sheath was flushed and attached to pressurized and heparinized  saline bag for constant forward flow. Then a neuron Max 85cm catheter was prepared on the back table. Ace 68 intermediate catheter was then loaded though the Neuron Max catheter and advanced over the [facility] wire to the internal carotid artery. Wire was removed, and roadmap angiogram was performed. Microcatheter system was then introduced through the Ace catheter using a synchro soft 014 wire and a Trevo Provue18 catheter. Microcatheter system was advanced into the internal carotid artery, to the level of the occlusion. The micro wire was then carefully advanced through the occluded segment. Microcatheter was then push through the occluded segment and the wire was removed. Ace catheter was advanced over the micro wire to the ophthalmic ICA segment. Blood was then aspirated through the hub of the microcatheter, and a gentle contrast injection was performed confirming intraluminal position. A rotating hemostatic valve was then attached to the back end of the microcatheter, and a pressurized and heparinized saline bag was attached to the catheter. 4 x 40 solitaire device was then selected. Back flush was achieved at the rotating hemostatic valve, and then the device was gently advanced through the microcatheter to the distal end. The retriever was then unsheathed by withdrawing the microcatheter under fluoroscopy. Once the retriever was completely unsheathed,  control angiogram was performed from the balloon catheter. Constant aspiration was then performed through the Ace catheter as the retriever was gently and slowly withdrawn with fluoroscopic observation. Once the retriever was entirely removed from the system, free aspiration was confirmed at the hub of the intermediate catheter, with free blood return confirmed. Control angiogram was then performed. Persistent occlusion at the proximal MCA was confirmed. The microcatheter system was then advanced through the Ace catheter. On the 2nd attempt, a lateral view was required in order to guide the microcatheter system beyond the ophthalmic artery origin. Once the micro wire microcatheter were beyond the occlusion, the micro wire was removed and solitaire 4 x 40 device was deployed. After the solitaire was deployed across the occlusion, the Ace catheter was advanced to the M1 segment at the site of the occlusion. Local aspiration was performed upon withdrawal of the solitaire device under fluoroscopic observation. Once the retrieve her was entirely removed from the system, free aspiration was confirmed at the hub of the intermediate catheter, with free blood return confirmed. Control angiogram was again performed. Review of the available imaging was performed at this time. The intermediate catheter was removed and Neuro Max catheter were removed. Diagnostic imaging was performed at the right vert origin. Angiogram of the left carotid system was also performed including intracranial images. Catheter was removed, and the bright tip sheath was exchanged for a short 8 French sheath at the right common femoral artery. Control angiogram was performed at the right common femoral artery puncture site. After the angiogram, right common femoral sheath was removed with deployment of Exoseal device. Patient tolerated the procedure well and remained hemodynamically stable throughout. No complications were encountered. Estimated blood  loss approximately 50 cc. FINDINGS: Baseline angiogram Right common carotid artery:  Normal course caliber and contour. Right external carotid artery: Patent with antegrade flow. Significant flow through the superficial temporal branch, potentially contributing to collateral flow intracranial. Right internal carotid artery: Normal course caliber and contour of the cervical portion. Vertical and petrous segment patent with normal course caliber contour. Cavernous segment patent. Clinoid segment patent. Antegrade flow of the ophthalmic artery. Ophthalmic segment patent. Terminus patent. Right MCA: Proximal occlusion of the middle cerebral artery. There is excellent collateral  flow to the right hemisphere VA anterior cerebral artery. Right ACA: A 1 segment patent. A 2 segment perfuses the right territory. Baseline perfusion TICI 1, with excellent collateral perfusion. After the 1st pass, there is no significant improvement through the occlusion, with persistent TICI 1. After the 2nd pass, there is very minimal improvement in flow through the occluded segment, with persisting TICI 1 flow, and excellent collateral flow maintained. Vertebral origin injection demonstrates normal course contour and caliber of the right vertebral artery, with patent V3 segment. Cross flow from the left vertebral artery with partial washout in the basilar artery. Patent posterior inferior cerebellar artery, anterior inferior cerebellar artery, and superior cerebral artery. Left carotid angiogram demonstrates normal course caliber and contour of the left CCA and cervical ICA. Patent external carotid artery. Suggestion of mild atherosclerotic changes at the origin of the A1 segment, with patent 82 segment. Patent middle cerebral artery with typical appearance of the arterial, late arterial, capillary and venous phase. IMPRESSION: Status post cerebral angiogram with attempted mechanical thrombectomy of right MCA occlusion. After 2 passes with  local aspiration and stentreiver technology, there is persistent occlusion and TICI 1 flow. Excellent collateral flow perfusing the right MCA territory. My impression is that there is likely underlying stenosis at the site of occlusion given the collateral flow, and that further manipulation may cause dissection or hemorrhage. Given the excellent collateral flow and absence of core infarction on the perfusion imaging, we elected to defer further attempts at thrombectomy. Deployment of Exoseal for hemostasis. Signed, Yvone Neu. Loreta Ave, DO [facility] Electronically Signed   By: [name] D.O.   On: 03/11/2016 13:10    Assessment/Plan: Diagnosis: right MCA infarct 1. Does the need for close, 24 hr/day medical supervision in concert with the patient's rehab needs make it unreasonable for this patient to be served in a less intensive setting? Yes 2. Co-Morbidities requiring supervision/potential complications: htn, post-stroke sequelae, nutrition 3. Due to bladder management, bowel management, safety, skin/wound care, disease management, medication administration, pain management and patient education, does the patient require 24 hr/day rehab nursing? Yes 4. Does the patient require coordinated care of a physician, rehab nurse, PT (1-2 hrs/day, 5 days/week), OT (1-2 hrs/day, 5 days/week) and SLP (1-2 hrs/day, 5 days/week) to address physical and functional deficits in the context of the above medical diagnosis(es)? Yes Addressing deficits in the following areas: balance, endurance, locomotion, strength, transferring, bowel/bladder control, bathing, dressing, feeding, grooming, toileting, cognition, speech, swallowing and psychosocial support 5. Can the patient actively participate in an intensive therapy program of at least 3 hrs of therapy per day at least 5 days per week? Yes 6. The potential for patient to make measurable gains while on inpatient  rehab is excellent 7. Anticipated functional outcomes upon discharge from inpatient rehab are supervision and min assist  with PT, supervision, min assist and mod assist with OT, modified independent and supervision with SLP. 8. Estimated rehab length of stay to reach the above functional goals is: 20-26 days 9. Does the patient have adequate social supports and living environment to accommodate these discharge functional goals? Yes 10. Anticipated D/C setting: Home 11. Anticipated post D/C treatments: HH therapy and Outpatient therapy 12. Overall Rehab/Functional Prognosis: excellent  RECOMMENDATIONS: This patient's condition is appropriate for continued rehabilitative care in the following setting: CIR Patient has agreed to participate in recommended program. Yes and Potentially Note that insurance prior authorization may be required for reimbursement for recommended care.  Comment:  Rehab Admissions Coordinator to follow up.   **Note** Pt speaks no english  Thanks,  Ranelle Oyster, MD, [place] [facility]    Charlton Amor., PA-C 03/12/2016

## 2016-03-12 NOTE — Progress Notes (Signed)
eLink Physician-Brief Progress Note Patient Name: Grant Mccallumntonio Shieh DOB: 14-Mar-1965 MRN: [id number]   Date of Service  03/12/2016  HPI/Events of Note  Needs sedation for MRI  eICU Interventions  Ativan 2mg  IV now     Intervention Category Major Interventions: Change in mental status - evaluation and management  Max FickleDouglas McQuaid 03/12/2016, 4:28 AM

## 2016-03-12 NOTE — Progress Notes (Signed)
  Echocardiogram 2D Echocardiogram has been performed.  Grant Garcia, Grant Garcia 03/12/2016, 3:17 PM

## 2016-03-12 NOTE — Progress Notes (Signed)
Inpatient Rehabilitation  Note that PT is also recommending IP Rehab, recommend an order for a consult at this time.  Team notified.   Charlane FerrettiMelissa Natlie Asfour, M.A., CCC/SLP Admission Coordinator  [facility]  Cell [phone]

## 2016-03-12 NOTE — Evaluation (Signed)
Occupational Therapy Evaluation Patient Details Name: Grant Garcia MRN: [id number] DOB: 1965-10-05 Today's Date: 03/12/2016    History of Present Illness Pt is 51 y.o. male, current smoker with no PMH who transferred to [facility] on 03/11/16 from [facility] with worsening left sided weakness. 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 Thrombectomy attempted inNeuro IR2/14/18 without further manipulation due to risk for dissection.  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 He demonstrates the below listed deficits and will benefit from continued OT to maximize safety and independence with BADLs.  Pt presents to OT with impaired balance, Lt inattention, Lt hemiparesis, impaired cognition, visual/perceptual deficits.  He requires mod - max a for ADLs.  He previously was independent with ADLs PTA, working as a Administrator.  Recommend CIR.       Follow Up Recommendations  CIR;Supervision/Assistance - 24 hour    Equipment Recommendations  3 in 1 bedside commode;Tub/shower bench    Recommendations for Other Services Rehab consult     Precautions / Restrictions Precautions Precautions: Fall      Mobility Bed Mobility Overal bed mobility: Needs Assistance Bed Mobility: Sit to Supine       Sit to supine: Max assist   General bed mobility comments: assist to lower trunk to bed and to lift LEs onto bed   Transfers Overall transfer level: Needs assistance Equipment used: 1 person hand held assist Transfers: Sit to/from [employer] Transfers Sit to Stand: Mod assist Stand pivot transfers: Mod assist       General transfer comment: facilitation for anterior translation of trunk and assist to move into standing and assist for balance     Balance Overall  balance assessment: Needs assistance Sitting-balance support: Feet supported Sitting balance-Leahy Scale: Poor Sitting balance - Comments: Loses balance to the Lt Postural control: Left lateral lean Standing balance support: Single extremity supported Standing balance-Leahy Scale: Poor Standing balance comment: mod A to maintain standing balance                             ADL Overall ADL's : Needs assistance/impaired     Grooming: Wash/dry hands;Wash/dry face;Brushing hair;Moderate assistance;Sitting Grooming Details (indicate cue type and reason): assist to comb left side  Upper Body Bathing: Moderate assistance;Sitting   Lower Body Bathing: Maximal assistance;Sit to/from stand   Upper Body Dressing : Maximal assistance;Sitting   Lower Body Dressing: Total assistance;Sit to/from stand   Toilet Transfer: Moderate assistance;Stand-pivot;BSC   Toileting- Clothing Manipulation and Hygiene: Total assistance;Sit to/from stand       Functional mobility during ADLs: Moderate assistance General ADL Comments: Pt neglecting Lt side q     Vision Additional Comments: difficult to accurately assess due to lethargy and impaired attention    Perception Perception Perception Tested?: Yes Perception Deficits: Inattention/neglect Inattention/Neglect: Does not attend to left side of body;Does not attend to left visual field   Praxis Praxis Praxis tested?: Deficits Deficits: Initiation    Pertinent Vitals/Pain Pain Assessment: Faces Faces Pain Scale: No hurt     Hand Dominance Right   Extremity/Trunk Assessment Upper Extremity Assessment Upper Extremity Assessment: LUE deficits/detail LUE Deficits / Details: Pt with minimal spontaneous finger flex/extension, and trace elbow flex.  PROM  WFL  LUE Sensation: decreased proprioception LUE Coordination: decreased fine motor;decreased gross motor   Lower Extremity Assessment Lower Extremity Assessment: Defer to PT  evaluation   Cervical / Trunk Assessment Cervical / Trunk Assessment: Other exceptions Cervical / Trunk Exceptions: pt maintains flexed thoracic and lumbar spine with passive lateral elongation Lt trunk.  Pt with dec   Communication Communication Communication: Prefers language other than English;Interpreter utilized   Cognition Arousal/Alertness: Awake/alert;Lethargic Behavior During Therapy: Flat affect Overall Cognitive Status: Impaired/Different from baseline Area of Impairment: Orientation;Attention;Following commands;Safety/judgement;Awareness;Problem solving Orientation Level: Disoriented to;Situation;Place Current Attention Level: Sustained (up to 45 seconds with min cues )   Following Commands: Follows one step commands consistently;Follows multi-step commands with increased time Safety/Judgement: Decreased awareness of safety;Decreased awareness of deficits Awareness:  (unable to state he had a stroke ) Problem Solving: Slow processing;Difficulty sequencing;Requires verbal cues;Requires tactile cues General Comments: Pt knows he's in a hospital, [place] [facility] [place] [facility] [place]   When asked, he states he's in hospital because he's sick.  He states he is normal with no deficits.    General Comments       Exercises       Shoulder Instructions      Home Living Family/patient expects to be discharged to:: Inpatient rehab Living Arrangements: Spouse/significant other;Children Available Help at Discharge: Family;Available 24 hours/day Type of Home: House Home Access: Level entry     Home Layout: Able to live on main level with bedroom/bathroom     Bathroom Shower/Tub: Tub/shower unit;Curtain Shower/tub characteristics: Curtain       Home Equipment: None          Prior Functioning/Environment Level of Independence: Independent        Comments: Pt works as Administratorlandscaper.        OT Problem List: Decreased strength;Decreased range of motion;Decreased  activity tolerance;Impaired balance (sitting and/or standing);Impaired vision/perception;Decreased coordination;Decreased cognition;Decreased safety awareness;Decreased knowledge of use of DME or AE;Impaired sensation;Impaired tone;Impaired UE functional use;Pain   OT Treatment/Interventions: Self-care/ADL training;Neuromuscular education;DME and/or AE instruction;Cognitive remediation/compensation;Therapeutic activities;Visual/perceptual remediation/compensation;Patient/family education;Balance training;Splinting;Manual therapy    OT Goals(Current goals can be found in the care plan section) Acute Rehab OT Goals Patient Stated Goal: return to normal  OT Goal Formulation: With patient/family Time For Goal Achievement: 03/26/16 Potential to Achieve Goals: Good ADL Goals Pt Will Perform Grooming: with min assist;sitting Pt Will Perform Upper Body Bathing: with min assist;sitting Pt Will Perform Lower Body Bathing: with mod assist;sit to/from stand Pt Will Transfer to Toilet: with min assist;stand pivot transfer;bedside commode Additional ADL Goal #1: Pt will locate ADL items on Lt with min cues  Additional ADL Goal #2: Pt will use Lt UE as a stabilizer during ADL tasks with min A   OT Frequency: Min 2X/week   Barriers to D/C: Decreased caregiver support          Co-evaluation              End of Session Nurse Communication: Mobility status  Activity Tolerance: Patient limited by fatigue Patient left: in bed;with call bell/phone within reach;with family/visitor present;with nursing/sitter in room   Time: [id number] OT Time Calculation (min): 69 min Charges:  OT General Charges $OT Visit: 1 Procedure OT Evaluation $OT Eval Moderate Complexity: 1 Procedure OT Treatments $Self Care/Home Management : 8-22 mins $Therapeutic Activity: 38-52 mins G-Codes:    Jaylean Buenaventura M 03/12/2016, 4:41 PM

## 2016-03-12 NOTE — Progress Notes (Signed)
Referring Physician(s): Dr Jacqlyn Larsen  Supervising Physician: [name]  Patient Status:  [facility] - In-pt  Chief Complaint: CVA Initial angio shows occlusion of the proximal MCA.  Excellent collateral flow filling the MCA territory via the ACA.  No significant improvement in flow through the occlusion, with maintained collateral flow perfusing the MCA territory.  Final flow via the occlusion, TICI 1, unchanged.  Given the behavior of the site of occlusion, suspect chronic atherosclerotic plaque/narrowing with development of collateral flow.  Subjective:  Moving Rt well To command No movement on Left Follows commands + facial/mouth droop   Allergies: Patient has no known allergies.  Medications: Prior to Admission medications   Not on File     Vital Signs: BP 124/78 (BP Location: Right Arm)   Pulse 61   Temp 98.7 F (37.1 C) (Oral)   Resp 18   Ht 5\' 6"  (1.676 m)   Wt 164 lb 10.9 oz (74.7 kg)   SpO2 100%   BMI 26.58 kg/m   Physical Exam  HENT:  + mouth droop on left + ptosis left Tongue protrudes to Rt  Eyes:  Cannot open left eyelid  Musculoskeletal:  Moves right well Arm and leg with good strength Good grip No movement on left Rt groin NT no bleeding No hematoma soft   Neurological:  Awake Follows commands  Skin: Skin is warm.  Rt foot 2+ pulses  Nursing note and vitals reviewed.   Imaging: Ct Head Wo Contrast  Result Date: 03/11/2016 CLINICAL DATA:  Status post mechanical thrombectomy. EXAM: CT HEAD WITHOUT CONTRAST TECHNIQUE: Contiguous axial images were obtained from the base of the skull through the vertex without intravenous contrast. COMPARISON:  Head CT from earlier today FINDINGS: Brain: Stable perforator type infarct in the right basal ganglia and internal capsule. No evidence of infarct progression. No acute hemorrhage. No hydrocephalus or mass. Vascular: Reason intravenous contrast. No detectable asymmetric enhancement. Skull: Remote  craniotomy on the right for hematoma evacuation. Sinuses/Orbits: Nasopharyngeal fluid in the setting of intubation. IMPRESSION: Stable postprocedural CT. No progression of the lenticulostriate infarct and no acute hemorrhage. Electronically Signed   By: Marnee Spring M.D.   On: 03/11/2016 12:41   Mr Brain Wo Contrast  Result Date: 03/12/2016 CLINICAL DATA:  Known RIGHT M1 occlusion, follow-up stroke. EXAM: MRI HEAD WITHOUT CONTRAST MRA HEAD WITHOUT CONTRAST TECHNIQUE: Multiplanar, multiecho pulse sequences of the brain and surrounding structures were obtained without intravenous contrast. Angiographic images of the head were obtained using MRA technique without contrast. Patient was unable to tolerate further imaging, coronal T2 not seen. COMPARISON:  CT HEAD March 11, 2016 FINDINGS: MRI HEAD FINDINGS- mildly motion degraded examination. BRAIN: Patchy reduced diffusion RIGHT basal ganglia, RIGHT frontotemporal parietal lobes with corresponding low ADC values faint clear T2 hyperintense signal. No susceptibility artifact to suggest hemorrhage. No susceptibility artifact to suggest hemorrhage. The ventricles and sulci are normal for patient's age. No suspicious parenchymal signal, masses or mass effect. No abnormal extra-axial fluid collections. VASCULAR: Normal major intracranial vascular flow voids present at skull base. SKULL AND UPPER CERVICAL SPINE: No abnormal sellar expansion. No suspicious calvarial bone marrow signal. Craniocervical junction maintained. SINUSES/ORBITS: The mastoid air-cells and included paranasal sinuses are well-aerated. The included ocular globes and orbital contents are non-suspicious. OTHER: None. MRA HEAD FINDINGS- moderately motion degraded ANTERIOR CIRCULATION: Flow related enhancement bilateral internal carotid arteries, apparent mild narrowing RIGHT cavernous internal carotid artery, the vessel is patent on prior CT and this is attributable to  calcific atherosclerosis.  Chronically occluded proximal RIGHT M1 segments, no collateral vessels identified on time-of-flight MRA. LEFT middle cerebral artery and bilateral anterior cerebral arteries are patent. POSTERIOR CIRCULATION: Bilateral vertebral arteries, basilar artery are patent. Limited assessment of the main branch vessels due to patient motion. Patent bilateral posterior cerebral arteries. Small RIGHT posterior communicating artery present as seen on prior CTA. ANATOMIC VARIANTS: None. 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 No hemorrhage. MRA HEAD: Moderately motion degraded examination. RIGHT M1 occlusion, no collateral vessels by time-of-flight technique. Electronically Signed   By: Awilda Metro M.D.   On: 03/12/2016 05:07   Ir US Guide Vasc Access Right  Result Date: 03/11/2016 INDICATION: 51 year old male presenting with acute left-sided symptoms and imaging evidence of right MCA emergent large vessel occlusion. CT imaging demonstrates normal aspects score, with CT perfusion shadowing elevated mean transit time with potential tissue at risk and no core infarct. EXAM: IR PERCUTANEOUS ART THORMBECTOMY/INFUSION INTRACRANIAL INCLUDE DIAG ANGIO; IR ANGIO VERTEBRAL SEL VERTEBRAL UNI RIGHT MOD SED; IR ANGIO INTRA EXTRACRAN SEL COM CAROTID INNOMINATE UNI LEFT MOD SED; IR ULTRASOUND GUIDANCE VASC ACCESS RIGHT COMPARISON:  No prior CT angiogram MEDICATIONS: 2.0 g Ancef. The antibiotic was administered within 1 hour of the procedure ANESTHESIA/SEDATION: Anesthesia team provided general anesthesia CONTRAST:  160 cc Isovue-300 FLUOROSCOPY TIME:  Fluoroscopy Time: 24 minutes 36 seconds (533 mGy). COMPLICATIONS: None TECHNIQUE: Informed written consent was obtained from the patient and the patient's family after a thorough discussion of the procedural risks, benefits and alternatives. Specific risks discussed include: Bleeding, infection, contrast reaction, kidney injury/failure, need for  further procedure/surgery, arterial injury or dissection, embolization to new territory, intracranial hemorrhage (10-15% risk), neurologic deterioration, cardiopulmonary collapse, death. All questions were addressed. Maximal Sterile Barrier Technique was utilized including during the procedure including caps, mask, sterile gowns, sterile gloves, sterile drape, hand hygiene and skin antiseptic. A timeout was performed prior to the initiation of the procedure. Ultrasound survey of the right inguinal region was performed with images stored and sent to PACs. 11 blade scalpel was used to make a small incision. A micropuncture needle was used access the right common femoral artery under ultrasound. With excellent arterial blood flow returned, an .018 micro wire was passed through the needle, observed to enter the abdominal aorta under fluoroscopy. The needle was removed, and a micropuncture sheath was placed over the wire. The inner dilator and wire were removed, and an 035 Bentson wire was advanced under fluoroscopy into the abdominal aorta. The sheath was removed and a standard 5 [place] vascular sheath was placed. The dilator was removed and the sheath was flushed. A 8F JB-1 diagnostic catheter was advanced over the wire to the proximal descending thoracic aorta. Wire was then removed. Double flush of the catheter was performed. Catheter was then used to select the innominate artery. Angiogram was performed. Using roadmap technique, the catheter was advanced over a roadrunner wire into cervical ICA. Formal angiogram was performed. Exchange length Rosen wire was then passed through the diagnostic catheter to the distal cervical ICA and the diagnostic catheter was removed. The 5 French sheath was removed and exchanged for 8 French 55 centimeter BrightTip sheath. Sheath was flushed and attached to pressurized and heparinized saline bag for constant forward flow. Then a neuron Max 85cm catheter was prepared on the back  table. Ace 68 intermediate catheter was then loaded though the Neuron Max catheter and advanced over the [facility] wire to the internal carotid artery. Wire was removed,  and roadmap angiogram was performed. Microcatheter system was then introduced through the Ace catheter using a synchro soft 014 wire and a Trevo Provue18 catheter. Microcatheter system was advanced into the internal carotid artery, to the level of the occlusion. The micro wire was then carefully advanced through the occluded segment. Microcatheter was then push through the occluded segment and the wire was removed. Ace catheter was advanced over the micro wire to the ophthalmic ICA segment. Blood was then aspirated through the hub of the microcatheter, and a gentle contrast injection was performed confirming intraluminal position. A rotating hemostatic valve was then attached to the back end of the microcatheter, and a pressurized and heparinized saline bag was attached to the catheter. 4 x 40 solitaire device was then selected. Back flush was achieved at the rotating hemostatic valve, and then the device was gently advanced through the microcatheter to the distal end. The retriever was then unsheathed by withdrawing the microcatheter under fluoroscopy. Once the retriever was completely unsheathed, control angiogram was performed from the balloon catheter. Constant aspiration was then performed through the Ace catheter as the retriever was gently and slowly withdrawn with fluoroscopic observation. Once the retriever was entirely removed from the system, free aspiration was confirmed at the hub of the intermediate catheter, with free blood return confirmed. Control angiogram was then performed. Persistent occlusion at the proximal MCA was confirmed. The microcatheter system was then advanced through the Ace catheter. On the 2nd attempt, a lateral view was required in order to guide the microcatheter system beyond the ophthalmic artery origin. Once the  micro wire microcatheter were beyond the occlusion, the micro wire was removed and solitaire 4 x 40 device was deployed. After the solitaire was deployed across the occlusion, the Ace catheter was advanced to the M1 segment at the site of the occlusion. Local aspiration was performed upon withdrawal of the solitaire device under fluoroscopic observation. Once the retrieve her was entirely removed from the system, free aspiration was confirmed at the hub of the intermediate catheter, with free blood return confirmed. Control angiogram was again performed. Review of the available imaging was performed at this time. The intermediate catheter was removed and Neuro Max catheter were removed. Diagnostic imaging was performed at the right vert origin. Angiogram of the left carotid system was also performed including intracranial images. Catheter was removed, and the bright tip sheath was exchanged for a short 8 French sheath at the right common femoral artery. Control angiogram was performed at the right common femoral artery puncture site. After the angiogram, right common femoral sheath was removed with deployment of Exoseal device. Patient tolerated the procedure well and remained hemodynamically stable throughout. No complications were encountered. Estimated blood loss approximately 50 cc. FINDINGS: Baseline angiogram Right common carotid artery:  Normal course caliber and contour. Right external carotid artery: Patent with antegrade flow. Significant flow through the superficial temporal branch, potentially contributing to collateral flow intracranial. Right internal carotid artery: Normal course caliber and contour of the cervical portion. Vertical and petrous segment patent with normal course caliber contour. Cavernous segment patent. Clinoid segment patent. Antegrade flow of the ophthalmic artery. Ophthalmic segment patent. Terminus patent. Right MCA: Proximal occlusion of the middle cerebral artery. There is  excellent collateral flow to the right hemisphere VA anterior cerebral artery. Right ACA: A 1 segment patent. A 2 segment perfuses the right territory. Baseline perfusion TICI 1, with excellent collateral perfusion. After the 1st pass, there is no significant improvement through the occlusion, with persistent  TICI 1. After the 2nd pass, there is very minimal improvement in flow through the occluded segment, with persisting TICI 1 flow, and excellent collateral flow maintained. Vertebral origin injection demonstrates normal course contour and caliber of the right vertebral artery, with patent V3 segment. Cross flow from the left vertebral artery with partial washout in the basilar artery. Patent posterior inferior cerebellar artery, anterior inferior cerebellar artery, and superior cerebral artery. Left carotid angiogram demonstrates normal course caliber and contour of the left CCA and cervical ICA. Patent external carotid artery. Suggestion of mild atherosclerotic changes at the origin of the A1 segment, with patent 82 segment. Patent middle cerebral artery with typical appearance of the arterial, late arterial, capillary and venous phase. IMPRESSION: Status post cerebral angiogram with attempted mechanical thrombectomy of right MCA occlusion. After 2 passes with local aspiration and stentreiver technology, there is persistent occlusion and TICI 1 flow. Excellent collateral flow perfusing the right MCA territory. My impression is that there is likely underlying stenosis at the site of occlusion given the collateral flow, and that further manipulation may cause dissection or hemorrhage. Given the excellent collateral flow and absence of core infarction on the perfusion imaging, we elected to defer further attempts at thrombectomy. Deployment of Exoseal for hemostasis. Signed, Yvone NeuJaime S. Loreta AveWagner, DO [facility] Electronically Signed   By: Gilmer MorJaime  Wagner D.O.    On: 03/11/2016 13:10   Ct Cerebral Perfusion W Contrast  Result Date: 03/11/2016 CLINICAL DATA:  51 year old male with right MCA M1 occlusion on CTA and only small lacunar type infarct in the right lentiform evident by CT since 05/1936 4 hours today. Initial encounter. EXAM: CT PERFUSION BRAIN TECHNIQUE: Multiphase CT imaging of the brain was performed following IV bolus contrast injection. Subsequent parametric perfusion maps were calculated using RAPID software. CONTRAST:  50 mL Isovue 370 COMPARISON:  CTA head and neck performed at 0640 hours today. Head CTs earlier today. FINDINGS: CT Brain Perfusion Findings: CBF (<30%) Volume:  0 mL Perfusion (Tmax>6.0s) volume: 118 mL mL, corresponding to the right MCA territory Mismatch Volume:  " Infinite" Infarction Location:Noncontrast head CT suggests there is a small core infarct in the right lentiform nuclei on this patient, but this is not detected using the perfusion thresholds for this exam. IMPRESSION: Large area of right MCA territory penumbra. Clinically suspected small volume core infarct in the right lentiform not detected. Overall very favorable CTP characteristics for endovascular reperfusion. I am advised that they are prepping for Neurointervention at the time of this dictation. Electronically Signed   By: Odessa FlemingH  Hall M.D.   On: 03/11/2016 09:27   Portable Chest Xray  Result Date: 03/12/2016 CLINICAL DATA:  Respiratory failure.  Stroke. EXAM: PORTABLE CHEST 1 VIEW COMPARISON:  03/11/2016. FINDINGS: Interim extubation. Mild narrowing of the upper trachea noted. Trachea stenosis or paratracheal process cannot be excluded. Mediastinum hilar structures normal. Cardiomegaly. Mild pulmonary venous congestion. No focal infiltrate. No pleural effusion or pneumothorax. No acute bony abnormality . IMPRESSION: 1. Interim extubation. Mild narrowing of the upper trachea noted. Tracheal stenosis or peritracheal process cannot be excluded . 2. Cardiomegaly with mild  pulmonary venous congestion. No overt pulmonary edema. Electronically Signed   By: Maisie Fushomas  Register   On: 03/12/2016 07:25   Portable Chest Xray  Result Date: 03/11/2016 CLINICAL DATA:  Hypoxia EXAM: PORTABLE CHEST 1 VIEW COMPARISON:  None. FINDINGS: Endotracheal tube tip is 2.9 cm above the carina. No pneumothorax. There is no edema or consolidation. Heart size  and pulmonary vascularity are normal. No adenopathy. No bone lesions. IMPRESSION: Endotracheal tube as described without pneumothorax. No edema or consolidation. Electronically Signed   By: Bretta Bang III M.D.   On: 03/11/2016 15:56   Mr Maxine Glenn Head/brain NG Cm  Result Date: 03/12/2016 CLINICAL DATA:  Known RIGHT M1 occlusion, follow-up stroke. EXAM: MRI HEAD WITHOUT CONTRAST MRA HEAD WITHOUT CONTRAST TECHNIQUE: Multiplanar, multiecho pulse sequences of the brain and surrounding structures were obtained without intravenous contrast. Angiographic images of the head were obtained using MRA technique without contrast. Patient was unable to tolerate further imaging, coronal T2 not seen. COMPARISON:  CT HEAD March 11, 2016 FINDINGS: MRI HEAD FINDINGS- mildly motion degraded examination. BRAIN: Patchy reduced diffusion RIGHT basal ganglia, RIGHT frontotemporal parietal lobes with corresponding low ADC values faint clear T2 hyperintense signal. No susceptibility artifact to suggest hemorrhage. No susceptibility artifact to suggest hemorrhage. The ventricles and sulci are normal for patient's age. No suspicious parenchymal signal, masses or mass effect. No abnormal extra-axial fluid collections. VASCULAR: Normal major intracranial vascular flow voids present at skull base. SKULL AND UPPER CERVICAL SPINE: No abnormal sellar expansion. No suspicious calvarial bone marrow signal. Craniocervical junction maintained. SINUSES/ORBITS: The mastoid air-cells and included paranasal sinuses are well-aerated. The included ocular globes and orbital contents are  non-suspicious. OTHER: None. MRA HEAD FINDINGS- moderately motion degraded ANTERIOR CIRCULATION: Flow related enhancement bilateral internal carotid arteries, apparent mild narrowing RIGHT cavernous internal carotid artery, the vessel is patent on prior CT and this is attributable to calcific atherosclerosis. Chronically occluded proximal RIGHT M1 segments, no collateral vessels identified on time-of-flight MRA. LEFT middle cerebral artery and bilateral anterior cerebral arteries are patent. POSTERIOR CIRCULATION: Bilateral vertebral arteries, basilar artery are patent. Limited assessment of the main branch vessels due to patient motion. Patent bilateral posterior cerebral arteries. Small RIGHT posterior communicating artery present as seen on prior CTA. ANATOMIC VARIANTS: None. 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 No hemorrhage. MRA HEAD: Moderately motion degraded examination. RIGHT M1 occlusion, no collateral vessels by time-of-flight technique. Electronically Signed   By: Awilda Metro M.D.   On: 03/12/2016 05:07   Ir Percutaneous Art Thrombectomy/infusion Intracranial Inc Diag Angio  Result Date: 03/11/2016 INDICATION: 51 year old male presenting with acute left-sided symptoms and imaging evidence of right MCA emergent large vessel occlusion. CT imaging demonstrates normal aspects score, with CT perfusion shadowing elevated mean transit time with potential tissue at risk and no core infarct. EXAM: IR PERCUTANEOUS ART THORMBECTOMY/INFUSION INTRACRANIAL INCLUDE DIAG ANGIO; IR ANGIO VERTEBRAL SEL VERTEBRAL UNI RIGHT MOD SED; IR ANGIO INTRA EXTRACRAN SEL COM CAROTID INNOMINATE UNI LEFT MOD SED; IR ULTRASOUND GUIDANCE VASC ACCESS RIGHT COMPARISON:  No prior CT angiogram MEDICATIONS: 2.0 g Ancef. The antibiotic was administered within 1 hour of the procedure ANESTHESIA/SEDATION: Anesthesia team provided general anesthesia CONTRAST:  160 cc Isovue-300 FLUOROSCOPY TIME:   Fluoroscopy Time: 24 minutes 36 seconds (533 mGy). COMPLICATIONS: None TECHNIQUE: Informed written consent was obtained from the patient and the patient's family after a thorough discussion of the procedural risks, benefits and alternatives. Specific risks discussed include: Bleeding, infection, contrast reaction, kidney injury/failure, need for further procedure/surgery, arterial injury or dissection, embolization to new territory, intracranial hemorrhage (10-15% risk), neurologic deterioration, cardiopulmonary collapse, death. All questions were addressed. Maximal Sterile Barrier Technique was utilized including during the procedure including caps, mask, sterile gowns, sterile gloves, sterile drape, hand hygiene and skin antiseptic. A timeout was performed prior to the initiation of the procedure. Ultrasound survey of the  right inguinal region was performed with images stored and sent to PACs. 11 blade scalpel was used to make a small incision. A micropuncture needle was used access the right common femoral artery under ultrasound. With excellent arterial blood flow returned, an .018 micro wire was passed through the needle, observed to enter the abdominal aorta under fluoroscopy. The needle was removed, and a micropuncture sheath was placed over the wire. The inner dilator and wire were removed, and an 035 Bentson wire was advanced under fluoroscopy into the abdominal aorta. The sheath was removed and a standard 5 [place] vascular sheath was placed. The dilator was removed and the sheath was flushed. A 33F JB-1 diagnostic catheter was advanced over the wire to the proximal descending thoracic aorta. Wire was then removed. Double flush of the catheter was performed. Catheter was then used to select the innominate artery. Angiogram was performed. Using roadmap technique, the catheter was advanced over a roadrunner wire into cervical ICA. Formal angiogram was performed. Exchange length Rosen wire was then passed  through the diagnostic catheter to the distal cervical ICA and the diagnostic catheter was removed. The 5 French sheath was removed and exchanged for 8 French 55 centimeter BrightTip sheath. Sheath was flushed and attached to pressurized and heparinized saline bag for constant forward flow. Then a neuron Max 85cm catheter was prepared on the back table. Ace 68 intermediate catheter was then loaded though the Neuron Max catheter and advanced over the [facility] wire to the internal carotid artery. Wire was removed, and roadmap angiogram was performed. Microcatheter system was then introduced through the Ace catheter using a synchro soft 014 wire and a Trevo Provue18 catheter. Microcatheter system was advanced into the internal carotid artery, to the level of the occlusion. The micro wire was then carefully advanced through the occluded segment. Microcatheter was then push through the occluded segment and the wire was removed. Ace catheter was advanced over the micro wire to the ophthalmic ICA segment. Blood was then aspirated through the hub of the microcatheter, and a gentle contrast injection was performed confirming intraluminal position. A rotating hemostatic valve was then attached to the back end of the microcatheter, and a pressurized and heparinized saline bag was attached to the catheter. 4 x 40 solitaire device was then selected. Back flush was achieved at the rotating hemostatic valve, and then the device was gently advanced through the microcatheter to the distal end. The retriever was then unsheathed by withdrawing the microcatheter under fluoroscopy. Once the retriever was completely unsheathed, control angiogram was performed from the balloon catheter. Constant aspiration was then performed through the Ace catheter as the retriever was gently and slowly withdrawn with fluoroscopic observation. Once the retriever was entirely removed from the system, free aspiration was confirmed at the hub of the  intermediate catheter, with free blood return confirmed. Control angiogram was then performed. Persistent occlusion at the proximal MCA was confirmed. The microcatheter system was then advanced through the Ace catheter. On the 2nd attempt, a lateral view was required in order to guide the microcatheter system beyond the ophthalmic artery origin. Once the micro wire microcatheter were beyond the occlusion, the micro wire was removed and solitaire 4 x 40 device was deployed. After the solitaire was deployed across the occlusion, the Ace catheter was advanced to the M1 segment at the site of the occlusion. Local aspiration was performed upon withdrawal of the solitaire device under fluoroscopic observation. Once the retrieve her was entirely removed from the system, free aspiration  was confirmed at the hub of the intermediate catheter, with free blood return confirmed. Control angiogram was again performed. Review of the available imaging was performed at this time. The intermediate catheter was removed and Neuro Max catheter were removed. Diagnostic imaging was performed at the right vert origin. Angiogram of the left carotid system was also performed including intracranial images. Catheter was removed, and the bright tip sheath was exchanged for a short 8 French sheath at the right common femoral artery. Control angiogram was performed at the right common femoral artery puncture site. After the angiogram, right common femoral sheath was removed with deployment of Exoseal device. Patient tolerated the procedure well and remained hemodynamically stable throughout. No complications were encountered. Estimated blood loss approximately 50 cc. FINDINGS: Baseline angiogram Right common carotid artery:  Normal course caliber and contour. Right external carotid artery: Patent with antegrade flow. Significant flow through the superficial temporal branch, potentially contributing to collateral flow intracranial. Right internal  carotid artery: Normal course caliber and contour of the cervical portion. Vertical and petrous segment patent with normal course caliber contour. Cavernous segment patent. Clinoid segment patent. Antegrade flow of the ophthalmic artery. Ophthalmic segment patent. Terminus patent. Right MCA: Proximal occlusion of the middle cerebral artery. There is excellent collateral flow to the right hemisphere VA anterior cerebral artery. Right ACA: A 1 segment patent. A 2 segment perfuses the right territory. Baseline perfusion TICI 1, with excellent collateral perfusion. After the 1st pass, there is no significant improvement through the occlusion, with persistent TICI 1. After the 2nd pass, there is very minimal improvement in flow through the occluded segment, with persisting TICI 1 flow, and excellent collateral flow maintained. Vertebral origin injection demonstrates normal course contour and caliber of the right vertebral artery, with patent V3 segment. Cross flow from the left vertebral artery with partial washout in the basilar artery. Patent posterior inferior cerebellar artery, anterior inferior cerebellar artery, and superior cerebral artery. Left carotid angiogram demonstrates normal course caliber and contour of the left CCA and cervical ICA. Patent external carotid artery. Suggestion of mild atherosclerotic changes at the origin of the A1 segment, with patent 82 segment. Patent middle cerebral artery with typical appearance of the arterial, late arterial, capillary and venous phase. IMPRESSION: Status post cerebral angiogram with attempted mechanical thrombectomy of right MCA occlusion. After 2 passes with local aspiration and stentreiver technology, there is persistent occlusion and TICI 1 flow. Excellent collateral flow perfusing the right MCA territory. My impression is that there is likely underlying stenosis at the site of occlusion given the collateral flow, and that further manipulation may cause  dissection or hemorrhage. Given the excellent collateral flow and absence of core infarction on the perfusion imaging, we elected to defer further attempts at thrombectomy. Deployment of Exoseal for hemostasis. Signed, Yvone Neu. Loreta Ave, DO [facility] Radiology Electronically Signed   By: [name] D.O.   On: 03/11/2016 13:10   Ct Head Code Stroke Wo Contrast`  Result Date: 03/11/2016 CLINICAL DATA:  Code stroke. 51 year old male with right MCA M1 occlusion on CTA head and neck for evaluation of 2 days of left side weakness. Initial encounter. EXAM: CT HEAD WITHOUT CONTRAST TECHNIQUE: Contiguous axial images were obtained from the base of the skull through the vertex without intravenous contrast. COMPARISON:  CTA 0640 hours today. Head CT without contrast 0544 hours today. FINDINGS: Brain: There is stable hypodensity in the right lentiform nuclei, but otherwise no cytotoxic edema is evident in the  right MCA territory. Gray-white matter differentiation appears stable No acute intracranial hemorrhage identified. No midline shift, mass effect, or evidence of intracranial mass lesion. No ventriculomegaly. Vascular: Mild residual intravascular contrast. Skull: Previous right frontotemporal craniotomy. No acute osseous abnormality identified. Sinuses/Orbits: Stable. Well pneumatized in general. Chronic left lamina papyracea fracture. Other: No acute orbit or scalp soft tissue findings. ASPECTS ([place] Stroke Program Early CT Score) - Ganglionic level infarction (caudate, lentiform nuclei, internal capsule, insula, M1-M3 cortex): 6 (minus 1 for L) - Supraganglionic infarction (M4-M6 cortex): 3 Total score (0-10 with 10 being normal): 9 IMPRESSION: 1. Stable noncontrast CT appearance of the brain since 0544 hours today: Hypodensity in the right lentiform nucleus, but no other CT changes of right MCA infarct. No acute intracranial hemorrhage. 2. ASPECTS is 9. 3. I was  advised that Dr. Ruthy Dick was discussing the findings on this study with Neurology at 0916 hours. Electronically Signed   By: Odessa Fleming M.D.   On: 03/11/2016 09:22   Ir Angio Intra Extracran Sel Com Carotid Innominate Uni L Mod Sed  Result Date: 03/11/2016 INDICATION: 51 year old male presenting with acute left-sided symptoms and imaging evidence of right MCA emergent large vessel occlusion. CT imaging demonstrates normal aspects score, with CT perfusion shadowing elevated mean transit time with potential tissue at risk and no core infarct. EXAM: IR PERCUTANEOUS ART THORMBECTOMY/INFUSION INTRACRANIAL INCLUDE DIAG ANGIO; IR ANGIO VERTEBRAL SEL VERTEBRAL UNI RIGHT MOD SED; IR ANGIO INTRA EXTRACRAN SEL COM CAROTID INNOMINATE UNI LEFT MOD SED; IR ULTRASOUND GUIDANCE VASC ACCESS RIGHT COMPARISON:  No prior CT angiogram MEDICATIONS: 2.0 g Ancef. The antibiotic was administered within 1 hour of the procedure ANESTHESIA/SEDATION: Anesthesia team provided general anesthesia CONTRAST:  160 cc Isovue-300 FLUOROSCOPY TIME:  Fluoroscopy Time: 24 minutes 36 seconds (533 mGy). COMPLICATIONS: None TECHNIQUE: Informed written consent was obtained from the patient and the patient's family after a thorough discussion of the procedural risks, benefits and alternatives. Specific risks discussed include: Bleeding, infection, contrast reaction, kidney injury/failure, need for further procedure/surgery, arterial injury or dissection, embolization to new territory, intracranial hemorrhage (10-15% risk), neurologic deterioration, cardiopulmonary collapse, death. All questions were addressed. Maximal Sterile Barrier Technique was utilized including during the procedure including caps, mask, sterile gowns, sterile gloves, sterile drape, hand hygiene and skin antiseptic. A timeout was performed prior to the initiation of the procedure. Ultrasound survey of the right inguinal region was performed with images stored and sent to PACs. 11  blade scalpel was used to make a small incision. A micropuncture needle was used access the right common femoral artery under ultrasound. With excellent arterial blood flow returned, an .018 micro wire was passed through the needle, observed to enter the abdominal aorta under fluoroscopy. The needle was removed, and a micropuncture sheath was placed over the wire. The inner dilator and wire were removed, and an 035 Bentson wire was advanced under fluoroscopy into the abdominal aorta. The sheath was removed and a standard 5 [place] vascular sheath was placed. The dilator was removed and the sheath was flushed. A 33F JB-1 diagnostic catheter was advanced over the wire to the proximal descending thoracic aorta. Wire was then removed. Double flush of the catheter was performed. Catheter was then used to select the innominate artery. Angiogram was performed. Using roadmap technique, the catheter was advanced over a roadrunner wire into cervical ICA. Formal angiogram was performed. Exchange length Rosen wire was then passed through the diagnostic catheter to the distal cervical ICA and the diagnostic catheter was removed.  The 5 French sheath was removed and exchanged for 8 French 55 centimeter BrightTip sheath. Sheath was flushed and attached to pressurized and heparinized saline bag for constant forward flow. Then a neuron Max 85cm catheter was prepared on the back table. Ace 68 intermediate catheter was then loaded though the Neuron Max catheter and advanced over the [facility] wire to the internal carotid artery. Wire was removed, and roadmap angiogram was performed. Microcatheter system was then introduced through the Ace catheter using a synchro soft 014 wire and a Trevo Provue18 catheter. Microcatheter system was advanced into the internal carotid artery, to the level of the occlusion. The micro wire was then carefully advanced through the occluded segment. Microcatheter was then push through the occluded segment and the  wire was removed. Ace catheter was advanced over the micro wire to the ophthalmic ICA segment. Blood was then aspirated through the hub of the microcatheter, and a gentle contrast injection was performed confirming intraluminal position. A rotating hemostatic valve was then attached to the back end of the microcatheter, and a pressurized and heparinized saline bag was attached to the catheter. 4 x 40 solitaire device was then selected. Back flush was achieved at the rotating hemostatic valve, and then the device was gently advanced through the microcatheter to the distal end. The retriever was then unsheathed by withdrawing the microcatheter under fluoroscopy. Once the retriever was completely unsheathed, control angiogram was performed from the balloon catheter. Constant aspiration was then performed through the Ace catheter as the retriever was gently and slowly withdrawn with fluoroscopic observation. Once the retriever was entirely removed from the system, free aspiration was confirmed at the hub of the intermediate catheter, with free blood return confirmed. Control angiogram was then performed. Persistent occlusion at the proximal MCA was confirmed. The microcatheter system was then advanced through the Ace catheter. On the 2nd attempt, a lateral view was required in order to guide the microcatheter system beyond the ophthalmic artery origin. Once the micro wire microcatheter were beyond the occlusion, the micro wire was removed and solitaire 4 x 40 device was deployed. After the solitaire was deployed across the occlusion, the Ace catheter was advanced to the M1 segment at the site of the occlusion. Local aspiration was performed upon withdrawal of the solitaire device under fluoroscopic observation. Once the retrieve her was entirely removed from the system, free aspiration was confirmed at the hub of the intermediate catheter, with free blood return confirmed. Control angiogram was again performed. Review  of the available imaging was performed at this time. The intermediate catheter was removed and Neuro Max catheter were removed. Diagnostic imaging was performed at the right vert origin. Angiogram of the left carotid system was also performed including intracranial images. Catheter was removed, and the bright tip sheath was exchanged for a short 8 French sheath at the right common femoral artery. Control angiogram was performed at the right common femoral artery puncture site. After the angiogram, right common femoral sheath was removed with deployment of Exoseal device. Patient tolerated the procedure well and remained hemodynamically stable throughout. No complications were encountered. Estimated blood loss approximately 50 cc. FINDINGS: Baseline angiogram Right common carotid artery:  Normal course caliber and contour. Right external carotid artery: Patent with antegrade flow. Significant flow through the superficial temporal branch, potentially contributing to collateral flow intracranial. Right internal carotid artery: Normal course caliber and contour of the cervical portion. Vertical and petrous segment patent with normal course caliber contour. Cavernous segment patent. Clinoid segment patent.  Antegrade flow of the ophthalmic artery. Ophthalmic segment patent. Terminus patent. Right MCA: Proximal occlusion of the middle cerebral artery. There is excellent collateral flow to the right hemisphere VA anterior cerebral artery. Right ACA: A 1 segment patent. A 2 segment perfuses the right territory. Baseline perfusion TICI 1, with excellent collateral perfusion. After the 1st pass, there is no significant improvement through the occlusion, with persistent TICI 1. After the 2nd pass, there is very minimal improvement in flow through the occluded segment, with persisting TICI 1 flow, and excellent collateral flow maintained. Vertebral origin injection demonstrates normal course contour and caliber of the right  vertebral artery, with patent V3 segment. Cross flow from the left vertebral artery with partial washout in the basilar artery. Patent posterior inferior cerebellar artery, anterior inferior cerebellar artery, and superior cerebral artery. Left carotid angiogram demonstrates normal course caliber and contour of the left CCA and cervical ICA. Patent external carotid artery. Suggestion of mild atherosclerotic changes at the origin of the A1 segment, with patent 82 segment. Patent middle cerebral artery with typical appearance of the arterial, late arterial, capillary and venous phase. IMPRESSION: Status post cerebral angiogram with attempted mechanical thrombectomy of right MCA occlusion. After 2 passes with local aspiration and stentreiver technology, there is persistent occlusion and TICI 1 flow. Excellent collateral flow perfusing the right MCA territory. My impression is that there is likely underlying stenosis at the site of occlusion given the collateral flow, and that further manipulation may cause dissection or hemorrhage. Given the excellent collateral flow and absence of core infarction on the perfusion imaging, we elected to defer further attempts at thrombectomy. Deployment of Exoseal for hemostasis. Signed, Yvone Neu. Loreta Ave, DO [facility] Radiology Electronically Signed   By: [name] D.O.   On: 03/11/2016 13:10   Ir Angio Vertebral Sel Vertebral Uni R Mod Sed  Result Date: 03/11/2016 INDICATION: 51 year old male presenting with acute left-sided symptoms and imaging evidence of right MCA emergent large vessel occlusion. CT imaging demonstrates normal aspects score, with CT perfusion shadowing elevated mean transit time with potential tissue at risk and no core infarct. EXAM: IR PERCUTANEOUS ART THORMBECTOMY/INFUSION INTRACRANIAL INCLUDE DIAG ANGIO; IR ANGIO VERTEBRAL SEL VERTEBRAL UNI RIGHT MOD SED; IR ANGIO INTRA EXTRACRAN SEL COM CAROTID  INNOMINATE UNI LEFT MOD SED; IR ULTRASOUND GUIDANCE VASC ACCESS RIGHT COMPARISON:  No prior CT angiogram MEDICATIONS: 2.0 g Ancef. The antibiotic was administered within 1 hour of the procedure ANESTHESIA/SEDATION: Anesthesia team provided general anesthesia CONTRAST:  160 cc Isovue-300 FLUOROSCOPY TIME:  Fluoroscopy Time: 24 minutes 36 seconds (533 mGy). COMPLICATIONS: None TECHNIQUE: Informed written consent was obtained from the patient and the patient's family after a thorough discussion of the procedural risks, benefits and alternatives. Specific risks discussed include: Bleeding, infection, contrast reaction, kidney injury/failure, need for further procedure/surgery, arterial injury or dissection, embolization to new territory, intracranial hemorrhage (10-15% risk), neurologic deterioration, cardiopulmonary collapse, death. All questions were addressed. Maximal Sterile Barrier Technique was utilized including during the procedure including caps, mask, sterile gowns, sterile gloves, sterile drape, hand hygiene and skin antiseptic. A timeout was performed prior to the initiation of the procedure. Ultrasound survey of the right inguinal region was performed with images stored and sent to PACs. 11 blade scalpel was used to make a small incision. A micropuncture needle was used access the right common femoral artery under ultrasound. With excellent arterial blood flow returned, an .018 micro wire was passed through the needle, observed to enter the  abdominal aorta under fluoroscopy. The needle was removed, and a micropuncture sheath was placed over the wire. The inner dilator and wire were removed, and an 035 Bentson wire was advanced under fluoroscopy into the abdominal aorta. The sheath was removed and a standard 5 [place] vascular sheath was placed. The dilator was removed and the sheath was flushed. A 76F JB-1 diagnostic catheter was advanced over the wire to the proximal descending thoracic aorta. Wire was then  removed. Double flush of the catheter was performed. Catheter was then used to select the innominate artery. Angiogram was performed. Using roadmap technique, the catheter was advanced over a roadrunner wire into cervical ICA. Formal angiogram was performed. Exchange length Rosen wire was then passed through the diagnostic catheter to the distal cervical ICA and the diagnostic catheter was removed. The 5 French sheath was removed and exchanged for 8 French 55 centimeter BrightTip sheath. Sheath was flushed and attached to pressurized and heparinized saline bag for constant forward flow. Then a neuron Max 85cm catheter was prepared on the back table. Ace 68 intermediate catheter was then loaded though the Neuron Max catheter and advanced over the [facility] wire to the internal carotid artery. Wire was removed, and roadmap angiogram was performed. Microcatheter system was then introduced through the Ace catheter using a synchro soft 014 wire and a Trevo Provue18 catheter. Microcatheter system was advanced into the internal carotid artery, to the level of the occlusion. The micro wire was then carefully advanced through the occluded segment. Microcatheter was then push through the occluded segment and the wire was removed. Ace catheter was advanced over the micro wire to the ophthalmic ICA segment. Blood was then aspirated through the hub of the microcatheter, and a gentle contrast injection was performed confirming intraluminal position. A rotating hemostatic valve was then attached to the back end of the microcatheter, and a pressurized and heparinized saline bag was attached to the catheter. 4 x 40 solitaire device was then selected. Back flush was achieved at the rotating hemostatic valve, and then the device was gently advanced through the microcatheter to the distal end. The retriever was then unsheathed by withdrawing the microcatheter under fluoroscopy. Once the retriever was completely unsheathed, control  angiogram was performed from the balloon catheter. Constant aspiration was then performed through the Ace catheter as the retriever was gently and slowly withdrawn with fluoroscopic observation. Once the retriever was entirely removed from the system, free aspiration was confirmed at the hub of the intermediate catheter, with free blood return confirmed. Control angiogram was then performed. Persistent occlusion at the proximal MCA was confirmed. The microcatheter system was then advanced through the Ace catheter. On the 2nd attempt, a lateral view was required in order to guide the microcatheter system beyond the ophthalmic artery origin. Once the micro wire microcatheter were beyond the occlusion, the micro wire was removed and solitaire 4 x 40 device was deployed. After the solitaire was deployed across the occlusion, the Ace catheter was advanced to the M1 segment at the site of the occlusion. Local aspiration was performed upon withdrawal of the solitaire device under fluoroscopic observation. Once the retrieve her was entirely removed from the system, free aspiration was confirmed at the hub of the intermediate catheter, with free blood return confirmed. Control angiogram was again performed. Review of the available imaging was performed at this time. The intermediate catheter was removed and Neuro Max catheter were removed. Diagnostic imaging was performed at the right vert origin. Angiogram of the left carotid  system was also performed including intracranial images. Catheter was removed, and the bright tip sheath was exchanged for a short 8 French sheath at the right common femoral artery. Control angiogram was performed at the right common femoral artery puncture site. After the angiogram, right common femoral sheath was removed with deployment of Exoseal device. Patient tolerated the procedure well and remained hemodynamically stable throughout. No complications were encountered. Estimated blood loss  approximately 50 cc. FINDINGS: Baseline angiogram Right common carotid artery:  Normal course caliber and contour. Right external carotid artery: Patent with antegrade flow. Significant flow through the superficial temporal branch, potentially contributing to collateral flow intracranial. Right internal carotid artery: Normal course caliber and contour of the cervical portion. Vertical and petrous segment patent with normal course caliber contour. Cavernous segment patent. Clinoid segment patent. Antegrade flow of the ophthalmic artery. Ophthalmic segment patent. Terminus patent. Right MCA: Proximal occlusion of the middle cerebral artery. There is excellent collateral flow to the right hemisphere VA anterior cerebral artery. Right ACA: A 1 segment patent. A 2 segment perfuses the right territory. Baseline perfusion TICI 1, with excellent collateral perfusion. After the 1st pass, there is no significant improvement through the occlusion, with persistent TICI 1. After the 2nd pass, there is very minimal improvement in flow through the occluded segment, with persisting TICI 1 flow, and excellent collateral flow maintained. Vertebral origin injection demonstrates normal course contour and caliber of the right vertebral artery, with patent V3 segment. Cross flow from the left vertebral artery with partial washout in the basilar artery. Patent posterior inferior cerebellar artery, anterior inferior cerebellar artery, and superior cerebral artery. Left carotid angiogram demonstrates normal course caliber and contour of the left CCA and cervical ICA. Patent external carotid artery. Suggestion of mild atherosclerotic changes at the origin of the A1 segment, with patent 82 segment. Patent middle cerebral artery with typical appearance of the arterial, late arterial, capillary and venous phase. IMPRESSION: Status post cerebral angiogram with attempted mechanical thrombectomy of right MCA occlusion. After 2 passes with local  aspiration and stentreiver technology, there is persistent occlusion and TICI 1 flow. Excellent collateral flow perfusing the right MCA territory. My impression is that there is likely underlying stenosis at the site of occlusion given the collateral flow, and that further manipulation may cause dissection or hemorrhage. Given the excellent collateral flow and absence of core infarction on the perfusion imaging, we elected to defer further attempts at thrombectomy. Deployment of Exoseal for hemostasis. Signed, Yvone Neu. Loreta Ave, DO [facility] Radiology Electronically Signed   By: [name] D.O.   On: 03/11/2016 13:10    Labs:  CBC:  Recent Labs  03/11/16 0920 03/12/16 0500  WBC 10.5 17.5*  HGB 14.8 12.8*  HCT 44.1 38.7*  PLT 145* 146*    COAGS:  Recent Labs  03/11/16 0920  INR 0.98    BMP:  Recent Labs  03/11/16 0920 03/11/16 1623 03/12/16 0500  NA 138 141 139  K 3.8 3.8 3.6  CL 104 109 105  CO2 24 23 26   GLUCOSE 120* 151* 109*  BUN 10 9 12   CALCIUM 9.2 8.8* 8.9  CREATININE 0.64 0.67 0.67  GFRNONAA >60 >60 >60  GFRAA >60 >60 >60    LIVER FUNCTION TESTS: No results for input(s): BILITOT, AST, ALT, ALKPHOS, PROT, ALBUMIN in the last 8760 hours.  Assessment and Plan:  R MCA occlusion No revascularization; + collaterals Wbc up to 17 today afeb Will follow Plan per  Neuro   Electronically Signed: Briana Farner A 03/12/2016, 9:02 AM   I spent a total of 15 Minutes at the the patient's bedside AND on the patient's hospital floor or unit, greater than 50% of which was counseling/coordinating care for CVA; R MCA attempted revasc

## 2016-03-12 NOTE — Progress Notes (Signed)
Inpatient Rehabilitation  Patient was screened by Fae PippinMelissa Hedda Crumbley for appropriateness for an Inpatient Acute Rehab consult.  At this time note only SLP evaluation.  Plan to follow along for OT/PT recommendations.  Please call with questions.   Charlane FerrettiMelissa Adarrius Graeff, M.A., CCC/SLP Admission Coordinator  [facility]  Cell [phone]

## 2016-03-12 NOTE — Evaluation (Signed)
Physical Therapy Evaluation Patient Details Name: Grant Garcia MRN: [id number] DOB: 04/15/1965 Today's Date: 03/12/2016   History of Present Illness  Pt is 51 y.o. male, current smoker with no PMH who transferred to [facility] on 03/11/16 from [facility] ER with worsening left sided weakness. 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 Thrombectomy attempted in Neuro IR 03/11/16 without further manipulation due to risk for dissection.   Clinical Impression  Pt presents with impaired L-side strength, mobility, transfers, attention, awareness, R gaze preference, and L inattention secondary to above. Primarily Spanish-speaking, but able to understand some commands in [place] (phone interpreter utilized; daughter arrived towards end of session who is fluent in [place] & English). PTA, pt worked as Administrator and independent with all amb and functional mobility; lives with family who can provide 24/7 supervision. Today, pt limited by lethargy, but able to transfer to chair with modA +2. Would benefit from continued acute PT with CIR follow-up prior to d/c home.     Follow Up Recommendations CIR;Supervision/Assistance - 24 hour    Equipment Recommendations  Other (comment) (Defer to next venue)    Recommendations for Other Services Rehab consult     Precautions / Restrictions Precautions Precautions: Fall Restrictions Weight Bearing Restrictions: No      Mobility  Bed Mobility Overal bed mobility: Needs Assistance Bed Mobility: Supine to Sit     Supine to sit: Min assist;HOB elevated     General bed mobility comments: Pt able to activate core and move BLE towards EOB, required minA for LUE and pelvic pad used for scooting. Slow and effortful to move L side.   Transfers Overall transfer level: Needs assistance   Transfers: Stand Pivot Transfers;Sit to/from Stand Sit to Stand: Mod assist;+2 physical assistance Stand pivot transfers: +2 physical  assistance;Mod assist       General transfer comment: ModA +2 for sit-to-stand and stand pivot transfer; increased L knee instability with standing and L lateral lean  Ambulation/Gait                Stairs            Wheelchair Mobility    Modified Rankin (Stroke Patients Only) Modified Rankin (Stroke Patients Only) Pre-Morbid Rankin Score: No symptoms Modified Rankin: Severe disability     Balance Overall balance assessment: Needs assistance Sitting-balance support: Bilateral upper extremity supported;Feet supported Sitting balance-Leahy Scale: Poor Sitting balance - Comments: Pt able to momentarily maintain seated balance with min guard and BUE support; required min-modA otherwise.  Postural control: Left lateral lean Standing balance support: Bilateral upper extremity supported Standing balance-Leahy Scale: Poor                               Pertinent Vitals/Pain Pain Assessment: No/denies pain    Home Living Family/patient expects to be discharged to:: Unsure Living Arrangements: Spouse/significant other;Children Available Help at Discharge: Family;Available 24 hours/day Type of Home: House Home Access: Level entry     Home Layout: Able to live on main level with bedroom/bathroom Home Equipment: None      Prior Function Level of Independence: Independent         Comments: Pt works as Administrator.     Hand Dominance        Extremity/Trunk Assessment   Upper Extremity Assessment Upper Extremity Assessment: LUE deficits/detail LUE Deficits / Details: Pt had trace distal RUE muscle activation    Lower Extremity  Assessment Lower Extremity Assessment: Generalized weakness;LLE deficits/detail LLE Deficits / Details: Pt able to move LLE against gravity; knee instability with standing.     Cervical / Trunk Assessment Cervical / Trunk Assessment: Kyphotic  Communication   Communication: Prefers language other than  English;Interpreter utilized (Spanish phone interpreter utilized; daughter present later in session, who is fluent in [place] and [place])  Cognition Arousal/Alertness: Lethargic   Overall Cognitive Status: Impaired/Different from baseline Area of Impairment: Orientation;Following commands;Attention;Awareness Orientation Level: Disoriented to;Place Current Attention Level: Focused (Progressing towards sustained)   Following Commands: Follows one step commands consistently;Follows multi-step commands with increased time   Awareness: Intellectual (Progressing to emergent)   General Comments: Pt A&Ox3, able to recall some aspects of home set-up which was clarified by daughter. Daughter states his speech is slower than usual.     General Comments General comments (skin integrity, edema, etc.): Pt's wife present throughout session, but unable to speak [place]. Daughter arrived during session and able to speak Spanish & English. Helped by phone interpreter Simonne ComeLeo [phone].     Exercises     Assessment/Plan    PT Assessment Patient needs continued PT services  PT Problem List Decreased strength;Decreased mobility;Decreased range of motion;Decreased activity tolerance;Decreased balance;Decreased knowledge of use of DME;Decreased coordination;Decreased cognition          PT Treatment Interventions Gait training;Stair training;Functional mobility training;Therapeutic activities;Therapeutic exercise;Balance training;Neuromuscular re-education;Patient/family education    PT Goals (Current goals can be found in the Care Plan section)  Acute Rehab PT Goals Patient Stated Goal: Get better PT Goal Formulation: With patient Time For Goal Achievement: 03/26/16 Potential to Achieve Goals: Good    Frequency Min 4X/week   Barriers to discharge        Co-evaluation               End of Session Equipment Utilized During Treatment: Gait belt Activity Tolerance: Patient limited by  lethargy Patient left: in chair;with call bell/phone within reach;with family/visitor present;with chair alarm set Nurse Communication: Mobility status         Time: 0925-1000 PT Time Calculation (min) (ACUTE ONLY): 35 min   Charges:   PT Evaluation $PT Eval Moderate Complexity: 1 Procedure PT Treatments $Therapeutic Activity: 8-22 mins   PT G Codes:       Dewayne HatchJaclyn Leigh Britnay Magnussen, SPT [phone]  Ina HomesJaclyn Edvardo Honse 03/12/2016, 1:29 PM

## 2016-03-12 NOTE — Progress Notes (Signed)
PULMONARY / CRITICAL CARE MEDICINE   Name: Grant Garcia MRN: [id number] DOB: 11/06/1965    ADMISSION DATE:  03/11/2016 CONSULTATION DATE:  03/11/16  REFERRING MD:  Dr. Benedict Needy  CHIEF COMPLAINT:  Left sided weakness   HISTORY OF PRESENT ILLNESS:  [name], current smoker (1.5 - 2ppd x 30 years) with no PMH who transferred to [facility] on 2/14 from [facility] ER with worsening left sided weakness.    He was evaluated at [facility] with a CT of the head that was concerning for emergent large vessel occlusion of the right MCA.  He was transferred to [facility] as a CODE STROKE.  Perfusion study on performed with a repeat non-contrast CT of the head which showed a large area of right MCA territory penumbra.  The patient was taken to Neuro IR for thrombectomy.  Cerebral angiogram showed occlusion of the proximal MCA, excellent collateral flow filling the MCA territory via the ACA.  The patient was returned to ICU on mechanical ventilation for recovery.    PCCM consulted for evaluation / ICU assistance.     SUBJECTIVE:   Extubated on 2/14 and doing well.  Worked with PT this am.  (-) subjective complaints.  Fair cough.    VITAL SIGNS: BP 124/78 (BP Location: Right Arm)   Pulse 61   Temp 98.7 F (37.1 C) (Oral)   Resp 18   Ht 5\' 6"  (1.676 m)   Wt 74.7 kg (164 lb 10.9 oz)   SpO2 100%   BMI 26.58 kg/m   HEMODYNAMICS:    VENTILATOR SETTINGS: Vent Mode: CPAP;PSV FiO2 (%):  [40 %-100 %] 40 % Set Rate:  [14 bmp] 14 bmp Vt Set:  [550 mL] 550 mL PEEP:  [5 cmH20] 5 cmH20 Pressure Support:  [10 cmH20] 10 cmH20 Plateau Pressure:  [5 cmH20-15 cmH20] 5 cmH20  INTAKE / OUTPUT: I/O last 3 completed shifts: In: 1892.6 [I.V.:1892.6] Out: 1900 [Urine:1850; Blood:50]  PHYSICAL EXAMINATION: General:  Awake, comfortable, NAD.  Neuro:  L sided weakness in UE and LE. (+) facial asymetry. Able to walk normal.  HEENT: PERLA. (-) NVD.  Cardiovascular:  s1s2 rrr, no m/r/g Lungs:   Even/non-labored, CTA Abdomen:  Soft, non-tender, bsx4 active  Musculoskeletal:  No acute deformities  Skin:  Warm/dry, no edema   LABS:  BMET  Recent Labs Lab 03/11/16 0920 03/11/16 1623 03/12/16 0500  NA 138 141 139  K 3.8 3.8 3.6  CL 104 109 105  CO2 24 23 26   BUN 10 9 12   CREATININE 0.64 0.67 0.67  GLUCOSE 120* 151* 109*    Electrolytes  Recent Labs Lab 03/11/16 0920 03/11/16 1623 03/12/16 0500  CALCIUM 9.2 8.8* 8.9  MG  --   --  2.0  PHOS  --   --  3.6    CBC  Recent Labs Lab 03/11/16 0920 03/12/16 0500  WBC 10.5 17.5*  HGB 14.8 12.8*  HCT 44.1 38.7*  PLT 145* 146*    Coag's  Recent Labs Lab 03/11/16 0920  INR 0.98    Sepsis Markers No results for input(s): LATICACIDVEN, PROCALCITON, O2SATVEN in the last 168 hours.  ABG  Recent Labs Lab 03/11/16 1430  PHART 7.424  PCO2ART 35.7  PO2ART 394*    Liver Enzymes No results for input(s): AST, ALT, ALKPHOS, BILITOT, ALBUMIN in the last 168 hours.  Cardiac Enzymes No results for input(s): TROPONINI, PROBNP in the last 168 hours.  Glucose  Recent Labs Lab 03/11/16 1228  GLUCAP 121*  Imaging Ct Head Wo Contrast  Result Date: 03/11/2016 CLINICAL DATA:  Status post mechanical thrombectomy. EXAM: CT HEAD WITHOUT CONTRAST TECHNIQUE: Contiguous axial images were obtained from the base of the skull through the vertex without intravenous contrast. COMPARISON:  Head CT from earlier today FINDINGS: Brain: Stable perforator type infarct in the right basal ganglia and internal capsule. No evidence of infarct progression. No acute hemorrhage. No hydrocephalus or mass. Vascular: Reason intravenous contrast. No detectable asymmetric enhancement. Skull: Remote craniotomy on the right for hematoma evacuation. Sinuses/Orbits: Nasopharyngeal fluid in the setting of intubation. IMPRESSION: Stable postprocedural CT. No progression of the lenticulostriate infarct and no acute hemorrhage. Electronically  Signed   By: Marnee Spring M.D.   On: 03/11/2016 12:41   Mr Brain Wo Contrast  Result Date: 03/12/2016 CLINICAL DATA:  Known RIGHT M1 occlusion, follow-up stroke. EXAM: MRI HEAD WITHOUT CONTRAST MRA HEAD WITHOUT CONTRAST TECHNIQUE: Multiplanar, multiecho pulse sequences of the brain and surrounding structures were obtained without intravenous contrast. Angiographic images of the head were obtained using MRA technique without contrast. Patient was unable to tolerate further imaging, coronal T2 not seen. COMPARISON:  CT HEAD March 11, 2016 FINDINGS: MRI HEAD FINDINGS- mildly motion degraded examination. BRAIN: Patchy reduced diffusion RIGHT basal ganglia, RIGHT frontotemporal parietal lobes with corresponding low ADC values faint clear T2 hyperintense signal. No susceptibility artifact to suggest hemorrhage. No susceptibility artifact to suggest hemorrhage. The ventricles and sulci are normal for patient's age. No suspicious parenchymal signal, masses or mass effect. No abnormal extra-axial fluid collections. VASCULAR: Normal major intracranial vascular flow voids present at skull base. SKULL AND UPPER CERVICAL SPINE: No abnormal sellar expansion. No suspicious calvarial bone marrow signal. Craniocervical junction maintained. SINUSES/ORBITS: The mastoid air-cells and included paranasal sinuses are well-aerated. The included ocular globes and orbital contents are non-suspicious. OTHER: None. MRA HEAD FINDINGS- moderately motion degraded ANTERIOR CIRCULATION: Flow related enhancement bilateral internal carotid arteries, apparent mild narrowing RIGHT cavernous internal carotid artery, the vessel is patent on prior CT and this is attributable to calcific atherosclerosis. Chronically occluded proximal RIGHT M1 segments, no collateral vessels identified on time-of-flight MRA. LEFT middle cerebral artery and bilateral anterior cerebral arteries are patent. POSTERIOR CIRCULATION: Bilateral vertebral arteries, basilar  artery are patent. Limited assessment of the main branch vessels due to patient motion. Patent bilateral posterior cerebral arteries. Small RIGHT posterior communicating artery present as seen on prior CTA. ANATOMIC VARIANTS: None. 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 No hemorrhage. MRA HEAD: Moderately motion degraded examination. RIGHT M1 occlusion, no collateral vessels by time-of-flight technique. Electronically Signed   By: Awilda Metro M.D.   On: 03/12/2016 05:07   Ir US Guide Vasc Access Right  Result Date: 03/11/2016 INDICATION: 51 year old male presenting with acute left-sided symptoms and imaging evidence of right MCA emergent large vessel occlusion. CT imaging demonstrates normal aspects score, with CT perfusion shadowing elevated mean transit time with potential tissue at risk and no core infarct. EXAM: IR PERCUTANEOUS ART THORMBECTOMY/INFUSION INTRACRANIAL INCLUDE DIAG ANGIO; IR ANGIO VERTEBRAL SEL VERTEBRAL UNI RIGHT MOD SED; IR ANGIO INTRA EXTRACRAN SEL COM CAROTID INNOMINATE UNI LEFT MOD SED; IR ULTRASOUND GUIDANCE VASC ACCESS RIGHT COMPARISON:  No prior CT angiogram MEDICATIONS: 2.0 g Ancef. The antibiotic was administered within 1 hour of the procedure ANESTHESIA/SEDATION: Anesthesia team provided general anesthesia CONTRAST:  160 cc Isovue-300 FLUOROSCOPY TIME:  Fluoroscopy Time: 24 minutes 36 seconds (533 mGy). COMPLICATIONS: None TECHNIQUE: Informed written consent was obtained from the patient and the patient's  family after a thorough discussion of the procedural risks, benefits and alternatives. Specific risks discussed include: Bleeding, infection, contrast reaction, kidney injury/failure, need for further procedure/surgery, arterial injury or dissection, embolization to new territory, intracranial hemorrhage (10-15% risk), neurologic deterioration, cardiopulmonary collapse, death. All questions were addressed. Maximal Sterile Barrier Technique  was utilized including during the procedure including caps, mask, sterile gowns, sterile gloves, sterile drape, hand hygiene and skin antiseptic. A timeout was performed prior to the initiation of the procedure. Ultrasound survey of the right inguinal region was performed with images stored and sent to PACs. 11 blade scalpel was used to make a small incision. A micropuncture needle was used access the right common femoral artery under ultrasound. With excellent arterial blood flow returned, an .018 micro wire was passed through the needle, observed to enter the abdominal aorta under fluoroscopy. The needle was removed, and a micropuncture sheath was placed over the wire. The inner dilator and wire were removed, and an 035 Bentson wire was advanced under fluoroscopy into the abdominal aorta. The sheath was removed and a standard 5 [place] vascular sheath was placed. The dilator was removed and the sheath was flushed. A 91F JB-1 diagnostic catheter was advanced over the wire to the proximal descending thoracic aorta. Wire was then removed. Double flush of the catheter was performed. Catheter was then used to select the innominate artery. Angiogram was performed. Using roadmap technique, the catheter was advanced over a roadrunner wire into cervical ICA. Formal angiogram was performed. Exchange length Rosen wire was then passed through the diagnostic catheter to the distal cervical ICA and the diagnostic catheter was removed. The 5 French sheath was removed and exchanged for 8 French 55 centimeter BrightTip sheath. Sheath was flushed and attached to pressurized and heparinized saline bag for constant forward flow. Then a neuron Max 85cm catheter was prepared on the back table. Ace 68 intermediate catheter was then loaded though the Neuron Max catheter and advanced over the [facility] wire to the internal carotid artery. Wire was removed, and roadmap angiogram was performed. Microcatheter system was then introduced through the  Ace catheter using a synchro soft 014 wire and a Trevo Provue18 catheter. Microcatheter system was advanced into the internal carotid artery, to the level of the occlusion. The micro wire was then carefully advanced through the occluded segment. Microcatheter was then push through the occluded segment and the wire was removed. Ace catheter was advanced over the micro wire to the ophthalmic ICA segment. Blood was then aspirated through the hub of the microcatheter, and a gentle contrast injection was performed confirming intraluminal position. A rotating hemostatic valve was then attached to the back end of the microcatheter, and a pressurized and heparinized saline bag was attached to the catheter. 4 x 40 solitaire device was then selected. Back flush was achieved at the rotating hemostatic valve, and then the device was gently advanced through the microcatheter to the distal end. The retriever was then unsheathed by withdrawing the microcatheter under fluoroscopy. Once the retriever was completely unsheathed, control angiogram was performed from the balloon catheter. Constant aspiration was then performed through the Ace catheter as the retriever was gently and slowly withdrawn with fluoroscopic observation. Once the retriever was entirely removed from the system, free aspiration was confirmed at the hub of the intermediate catheter, with free blood return confirmed. Control angiogram was then performed. Persistent occlusion at the proximal MCA was confirmed. The microcatheter system was then advanced through the Ace catheter. On the 2nd attempt, a  lateral view was required in order to guide the microcatheter system beyond the ophthalmic artery origin. Once the micro wire microcatheter were beyond the occlusion, the micro wire was removed and solitaire 4 x 40 device was deployed. After the solitaire was deployed across the occlusion, the Ace catheter was advanced to the M1 segment at the site of the occlusion.  Local aspiration was performed upon withdrawal of the solitaire device under fluoroscopic observation. Once the retrieve her was entirely removed from the system, free aspiration was confirmed at the hub of the intermediate catheter, with free blood return confirmed. Control angiogram was again performed. Review of the available imaging was performed at this time. The intermediate catheter was removed and Neuro Max catheter were removed. Diagnostic imaging was performed at the right vert origin. Angiogram of the left carotid system was also performed including intracranial images. Catheter was removed, and the bright tip sheath was exchanged for a short 8 French sheath at the right common femoral artery. Control angiogram was performed at the right common femoral artery puncture site. After the angiogram, right common femoral sheath was removed with deployment of Exoseal device. Patient tolerated the procedure well and remained hemodynamically stable throughout. No complications were encountered. Estimated blood loss approximately 50 cc. FINDINGS: Baseline angiogram Right common carotid artery:  Normal course caliber and contour. Right external carotid artery: Patent with antegrade flow. Significant flow through the superficial temporal branch, potentially contributing to collateral flow intracranial. Right internal carotid artery: Normal course caliber and contour of the cervical portion. Vertical and petrous segment patent with normal course caliber contour. Cavernous segment patent. Clinoid segment patent. Antegrade flow of the ophthalmic artery. Ophthalmic segment patent. Terminus patent. Right MCA: Proximal occlusion of the middle cerebral artery. There is excellent collateral flow to the right hemisphere VA anterior cerebral artery. Right ACA: A 1 segment patent. A 2 segment perfuses the right territory. Baseline perfusion TICI 1, with excellent collateral perfusion. After the 1st pass, there is no significant  improvement through the occlusion, with persistent TICI 1. After the 2nd pass, there is very minimal improvement in flow through the occluded segment, with persisting TICI 1 flow, and excellent collateral flow maintained. Vertebral origin injection demonstrates normal course contour and caliber of the right vertebral artery, with patent V3 segment. Cross flow from the left vertebral artery with partial washout in the basilar artery. Patent posterior inferior cerebellar artery, anterior inferior cerebellar artery, and superior cerebral artery. Left carotid angiogram demonstrates normal course caliber and contour of the left CCA and cervical ICA. Patent external carotid artery. Suggestion of mild atherosclerotic changes at the origin of the A1 segment, with patent 82 segment. Patent middle cerebral artery with typical appearance of the arterial, late arterial, capillary and venous phase. IMPRESSION: Status post cerebral angiogram with attempted mechanical thrombectomy of right MCA occlusion. After 2 passes with local aspiration and stentreiver technology, there is persistent occlusion and TICI 1 flow. Excellent collateral flow perfusing the right MCA territory. My impression is that there is likely underlying stenosis at the site of occlusion given the collateral flow, and that further manipulation may cause dissection or hemorrhage. Given the excellent collateral flow and absence of core infarction on the perfusion imaging, we elected to defer further attempts at thrombectomy. Deployment of Exoseal for hemostasis. Signed, Yvone Neu. Loreta Ave, DO [facility] Radiology Electronically Signed   By: [name] D.O.   On: 03/11/2016 13:10   Portable Chest Xray  Result Date: 03/12/2016 CLINICAL  DATA:  Respiratory failure.  Stroke. EXAM: PORTABLE CHEST 1 VIEW COMPARISON:  03/11/2016. FINDINGS: Interim extubation. Mild narrowing of the upper trachea noted. Trachea stenosis  or paratracheal process cannot be excluded. Mediastinum hilar structures normal. Cardiomegaly. Mild pulmonary venous congestion. No focal infiltrate. No pleural effusion or pneumothorax. No acute bony abnormality . IMPRESSION: 1. Interim extubation. Mild narrowing of the upper trachea noted. Tracheal stenosis or peritracheal process cannot be excluded . 2. Cardiomegaly with mild pulmonary venous congestion. No overt pulmonary edema. Electronically Signed   By: Maisie Fus  Register   On: 03/12/2016 07:25   Portable Chest Xray  Result Date: 03/11/2016 CLINICAL DATA:  Hypoxia EXAM: PORTABLE CHEST 1 VIEW COMPARISON:  None. FINDINGS: Endotracheal tube tip is 2.9 cm above the carina. No pneumothorax. There is no edema or consolidation. Heart size and pulmonary vascularity are normal. No adenopathy. No bone lesions. IMPRESSION: Endotracheal tube as described without pneumothorax. No edema or consolidation. Electronically Signed   By: Bretta Bang III M.D.   On: 03/11/2016 15:56   Mr Maxine Glenn Head/brain ZO Cm  Result Date: 03/12/2016 CLINICAL DATA:  Known RIGHT M1 occlusion, follow-up stroke. EXAM: MRI HEAD WITHOUT CONTRAST MRA HEAD WITHOUT CONTRAST TECHNIQUE: Multiplanar, multiecho pulse sequences of the brain and surrounding structures were obtained without intravenous contrast. Angiographic images of the head were obtained using MRA technique without contrast. Patient was unable to tolerate further imaging, coronal T2 not seen. COMPARISON:  CT HEAD March 11, 2016 FINDINGS: MRI HEAD FINDINGS- mildly motion degraded examination. BRAIN: Patchy reduced diffusion RIGHT basal ganglia, RIGHT frontotemporal parietal lobes with corresponding low ADC values faint clear T2 hyperintense signal. No susceptibility artifact to suggest hemorrhage. No susceptibility artifact to suggest hemorrhage. The ventricles and sulci are normal for patient's age. No suspicious parenchymal signal, masses or mass effect. No abnormal extra-axial  fluid collections. VASCULAR: Normal major intracranial vascular flow voids present at skull base. SKULL AND UPPER CERVICAL SPINE: No abnormal sellar expansion. No suspicious calvarial bone marrow signal. Craniocervical junction maintained. SINUSES/ORBITS: The mastoid air-cells and included paranasal sinuses are well-aerated. The included ocular globes and orbital contents are non-suspicious. OTHER: None. MRA HEAD FINDINGS- moderately motion degraded ANTERIOR CIRCULATION: Flow related enhancement bilateral internal carotid arteries, apparent mild narrowing RIGHT cavernous internal carotid artery, the vessel is patent on prior CT and this is attributable to calcific atherosclerosis. Chronically occluded proximal RIGHT M1 segments, no collateral vessels identified on time-of-flight MRA. LEFT middle cerebral artery and bilateral anterior cerebral arteries are patent. POSTERIOR CIRCULATION: Bilateral vertebral arteries, basilar artery are patent. Limited assessment of the main branch vessels due to patient motion. Patent bilateral posterior cerebral arteries. Small RIGHT posterior communicating artery present as seen on prior CTA. ANATOMIC VARIANTS: None. 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 No hemorrhage. MRA HEAD: Moderately motion degraded examination. RIGHT M1 occlusion, no collateral vessels by time-of-flight technique. Electronically Signed   By: Awilda Metro M.D.   On: 03/12/2016 05:07   Ir Percutaneous Art Thrombectomy/infusion Intracranial Inc Diag Angio  Result Date: 03/11/2016 INDICATION: 51 year old male presenting with acute left-sided symptoms and imaging evidence of right MCA emergent large vessel occlusion. CT imaging demonstrates normal aspects score, with CT perfusion shadowing elevated mean transit time with potential tissue at risk and no core infarct. EXAM: IR PERCUTANEOUS ART THORMBECTOMY/INFUSION INTRACRANIAL INCLUDE DIAG ANGIO; IR ANGIO VERTEBRAL SEL  VERTEBRAL UNI RIGHT MOD SED; IR ANGIO INTRA EXTRACRAN SEL COM CAROTID INNOMINATE UNI LEFT MOD SED; IR ULTRASOUND GUIDANCE VASC ACCESS RIGHT COMPARISON:  No prior CT angiogram MEDICATIONS: 2.0 g Ancef. The antibiotic was administered within 1 hour of the procedure ANESTHESIA/SEDATION: Anesthesia team provided general anesthesia CONTRAST:  160 cc Isovue-300 FLUOROSCOPY TIME:  Fluoroscopy Time: 24 minutes 36 seconds (533 mGy). COMPLICATIONS: None TECHNIQUE: Informed written consent was obtained from the patient and the patient's family after a thorough discussion of the procedural risks, benefits and alternatives. Specific risks discussed include: Bleeding, infection, contrast reaction, kidney injury/failure, need for further procedure/surgery, arterial injury or dissection, embolization to new territory, intracranial hemorrhage (10-15% risk), neurologic deterioration, cardiopulmonary collapse, death. All questions were addressed. Maximal Sterile Barrier Technique was utilized including during the procedure including caps, mask, sterile gowns, sterile gloves, sterile drape, hand hygiene and skin antiseptic. A timeout was performed prior to the initiation of the procedure. Ultrasound survey of the right inguinal region was performed with images stored and sent to PACs. 11 blade scalpel was used to make a small incision. A micropuncture needle was used access the right common femoral artery under ultrasound. With excellent arterial blood flow returned, an .018 micro wire was passed through the needle, observed to enter the abdominal aorta under fluoroscopy. The needle was removed, and a micropuncture sheath was placed over the wire. The inner dilator and wire were removed, and an 035 Bentson wire was advanced under fluoroscopy into the abdominal aorta. The sheath was removed and a standard 5 [place] vascular sheath was placed. The dilator was removed and the sheath was flushed. A 12F JB-1 diagnostic catheter was advanced  over the wire to the proximal descending thoracic aorta. Wire was then removed. Double flush of the catheter was performed. Catheter was then used to select the innominate artery. Angiogram was performed. Using roadmap technique, the catheter was advanced over a roadrunner wire into cervical ICA. Formal angiogram was performed. Exchange length Rosen wire was then passed through the diagnostic catheter to the distal cervical ICA and the diagnostic catheter was removed. The 5 French sheath was removed and exchanged for 8 French 55 centimeter BrightTip sheath. Sheath was flushed and attached to pressurized and heparinized saline bag for constant forward flow. Then a neuron Max 85cm catheter was prepared on the back table. Ace 68 intermediate catheter was then loaded though the Neuron Max catheter and advanced over the [facility] wire to the internal carotid artery. Wire was removed, and roadmap angiogram was performed. Microcatheter system was then introduced through the Ace catheter using a synchro soft 014 wire and a Trevo Provue18 catheter. Microcatheter system was advanced into the internal carotid artery, to the level of the occlusion. The micro wire was then carefully advanced through the occluded segment. Microcatheter was then push through the occluded segment and the wire was removed. Ace catheter was advanced over the micro wire to the ophthalmic ICA segment. Blood was then aspirated through the hub of the microcatheter, and a gentle contrast injection was performed confirming intraluminal position. A rotating hemostatic valve was then attached to the back end of the microcatheter, and a pressurized and heparinized saline bag was attached to the catheter. 4 x 40 solitaire device was then selected. Back flush was achieved at the rotating hemostatic valve, and then the device was gently advanced through the microcatheter to the distal end. The retriever was then unsheathed by withdrawing the microcatheter under  fluoroscopy. Once the retriever was completely unsheathed, control angiogram was performed from the balloon catheter. Constant aspiration was then performed through the Ace catheter as the retriever was gently and slowly withdrawn with fluoroscopic  observation. Once the retriever was entirely removed from the system, free aspiration was confirmed at the hub of the intermediate catheter, with free blood return confirmed. Control angiogram was then performed. Persistent occlusion at the proximal MCA was confirmed. The microcatheter system was then advanced through the Ace catheter. On the 2nd attempt, a lateral view was required in order to guide the microcatheter system beyond the ophthalmic artery origin. Once the micro wire microcatheter were beyond the occlusion, the micro wire was removed and solitaire 4 x 40 device was deployed. After the solitaire was deployed across the occlusion, the Ace catheter was advanced to the M1 segment at the site of the occlusion. Local aspiration was performed upon withdrawal of the solitaire device under fluoroscopic observation. Once the retrieve her was entirely removed from the system, free aspiration was confirmed at the hub of the intermediate catheter, with free blood return confirmed. Control angiogram was again performed. Review of the available imaging was performed at this time. The intermediate catheter was removed and Neuro Max catheter were removed. Diagnostic imaging was performed at the right vert origin. Angiogram of the left carotid system was also performed including intracranial images. Catheter was removed, and the bright tip sheath was exchanged for a short 8 French sheath at the right common femoral artery. Control angiogram was performed at the right common femoral artery puncture site. After the angiogram, right common femoral sheath was removed with deployment of Exoseal device. Patient tolerated the procedure well and remained hemodynamically stable  throughout. No complications were encountered. Estimated blood loss approximately 50 cc. FINDINGS: Baseline angiogram Right common carotid artery:  Normal course caliber and contour. Right external carotid artery: Patent with antegrade flow. Significant flow through the superficial temporal branch, potentially contributing to collateral flow intracranial. Right internal carotid artery: Normal course caliber and contour of the cervical portion. Vertical and petrous segment patent with normal course caliber contour. Cavernous segment patent. Clinoid segment patent. Antegrade flow of the ophthalmic artery. Ophthalmic segment patent. Terminus patent. Right MCA: Proximal occlusion of the middle cerebral artery. There is excellent collateral flow to the right hemisphere VA anterior cerebral artery. Right ACA: A 1 segment patent. A 2 segment perfuses the right territory. Baseline perfusion TICI 1, with excellent collateral perfusion. After the 1st pass, there is no significant improvement through the occlusion, with persistent TICI 1. After the 2nd pass, there is very minimal improvement in flow through the occluded segment, with persisting TICI 1 flow, and excellent collateral flow maintained. Vertebral origin injection demonstrates normal course contour and caliber of the right vertebral artery, with patent V3 segment. Cross flow from the left vertebral artery with partial washout in the basilar artery. Patent posterior inferior cerebellar artery, anterior inferior cerebellar artery, and superior cerebral artery. Left carotid angiogram demonstrates normal course caliber and contour of the left CCA and cervical ICA. Patent external carotid artery. Suggestion of mild atherosclerotic changes at the origin of the A1 segment, with patent 82 segment. Patent middle cerebral artery with typical appearance of the arterial, late arterial, capillary and venous phase. IMPRESSION: Status post cerebral angiogram with attempted  mechanical thrombectomy of right MCA occlusion. After 2 passes with local aspiration and stentreiver technology, there is persistent occlusion and TICI 1 flow. Excellent collateral flow perfusing the right MCA territory. My impression is that there is likely underlying stenosis at the site of occlusion given the collateral flow, and that further manipulation may cause dissection or hemorrhage. Given the excellent collateral flow and absence of core  infarction on the perfusion imaging, we elected to defer further attempts at thrombectomy. Deployment of Exoseal for hemostasis. Signed, Yvone Neu. Loreta Ave, DO [facility] Radiology Electronically Signed   By: [name] D.O.   On: 03/11/2016 13:10   Ir Angio Intra Extracran Sel Com Carotid Innominate Uni L Mod Sed  Result Date: 03/11/2016 INDICATION: 51 year old male presenting with acute left-sided symptoms and imaging evidence of right MCA emergent large vessel occlusion. CT imaging demonstrates normal aspects score, with CT perfusion shadowing elevated mean transit time with potential tissue at risk and no core infarct. EXAM: IR PERCUTANEOUS ART THORMBECTOMY/INFUSION INTRACRANIAL INCLUDE DIAG ANGIO; IR ANGIO VERTEBRAL SEL VERTEBRAL UNI RIGHT MOD SED; IR ANGIO INTRA EXTRACRAN SEL COM CAROTID INNOMINATE UNI LEFT MOD SED; IR ULTRASOUND GUIDANCE VASC ACCESS RIGHT COMPARISON:  No prior CT angiogram MEDICATIONS: 2.0 g Ancef. The antibiotic was administered within 1 hour of the procedure ANESTHESIA/SEDATION: Anesthesia team provided general anesthesia CONTRAST:  160 cc Isovue-300 FLUOROSCOPY TIME:  Fluoroscopy Time: 24 minutes 36 seconds (533 mGy). COMPLICATIONS: None TECHNIQUE: Informed written consent was obtained from the patient and the patient's family after a thorough discussion of the procedural risks, benefits and alternatives. Specific risks discussed include: Bleeding, infection, contrast reaction, kidney  injury/failure, need for further procedure/surgery, arterial injury or dissection, embolization to new territory, intracranial hemorrhage (10-15% risk), neurologic deterioration, cardiopulmonary collapse, death. All questions were addressed. Maximal Sterile Barrier Technique was utilized including during the procedure including caps, mask, sterile gowns, sterile gloves, sterile drape, hand hygiene and skin antiseptic. A timeout was performed prior to the initiation of the procedure. Ultrasound survey of the right inguinal region was performed with images stored and sent to PACs. 11 blade scalpel was used to make a small incision. A micropuncture needle was used access the right common femoral artery under ultrasound. With excellent arterial blood flow returned, an .018 micro wire was passed through the needle, observed to enter the abdominal aorta under fluoroscopy. The needle was removed, and a micropuncture sheath was placed over the wire. The inner dilator and wire were removed, and an 035 Bentson wire was advanced under fluoroscopy into the abdominal aorta. The sheath was removed and a standard 5 [place] vascular sheath was placed. The dilator was removed and the sheath was flushed. A 5F JB-1 diagnostic catheter was advanced over the wire to the proximal descending thoracic aorta. Wire was then removed. Double flush of the catheter was performed. Catheter was then used to select the innominate artery. Angiogram was performed. Using roadmap technique, the catheter was advanced over a roadrunner wire into cervical ICA. Formal angiogram was performed. Exchange length Rosen wire was then passed through the diagnostic catheter to the distal cervical ICA and the diagnostic catheter was removed. The 5 French sheath was removed and exchanged for 8 French 55 centimeter BrightTip sheath. Sheath was flushed and attached to pressurized and heparinized saline bag for constant forward flow. Then a neuron Max 85cm catheter was  prepared on the back table. Ace 68 intermediate catheter was then loaded though the Neuron Max catheter and advanced over the [facility] wire to the internal carotid artery. Wire was removed, and roadmap angiogram was performed. Microcatheter system was then introduced through the Ace catheter using a synchro soft 014 wire and a Trevo Provue18 catheter. Microcatheter system was advanced into the internal carotid artery, to the level of the occlusion. The micro wire was then carefully advanced through the occluded segment. Microcatheter was then push through the  occluded segment and the wire was removed. Ace catheter was advanced over the micro wire to the ophthalmic ICA segment. Blood was then aspirated through the hub of the microcatheter, and a gentle contrast injection was performed confirming intraluminal position. A rotating hemostatic valve was then attached to the back end of the microcatheter, and a pressurized and heparinized saline bag was attached to the catheter. 4 x 40 solitaire device was then selected. Back flush was achieved at the rotating hemostatic valve, and then the device was gently advanced through the microcatheter to the distal end. The retriever was then unsheathed by withdrawing the microcatheter under fluoroscopy. Once the retriever was completely unsheathed, control angiogram was performed from the balloon catheter. Constant aspiration was then performed through the Ace catheter as the retriever was gently and slowly withdrawn with fluoroscopic observation. Once the retriever was entirely removed from the system, free aspiration was confirmed at the hub of the intermediate catheter, with free blood return confirmed. Control angiogram was then performed. Persistent occlusion at the proximal MCA was confirmed. The microcatheter system was then advanced through the Ace catheter. On the 2nd attempt, a lateral view was required in order to guide the microcatheter system beyond the ophthalmic  artery origin. Once the micro wire microcatheter were beyond the occlusion, the micro wire was removed and solitaire 4 x 40 device was deployed. After the solitaire was deployed across the occlusion, the Ace catheter was advanced to the M1 segment at the site of the occlusion. Local aspiration was performed upon withdrawal of the solitaire device under fluoroscopic observation. Once the retrieve her was entirely removed from the system, free aspiration was confirmed at the hub of the intermediate catheter, with free blood return confirmed. Control angiogram was again performed. Review of the available imaging was performed at this time. The intermediate catheter was removed and Neuro Max catheter were removed. Diagnostic imaging was performed at the right vert origin. Angiogram of the left carotid system was also performed including intracranial images. Catheter was removed, and the bright tip sheath was exchanged for a short 8 French sheath at the right common femoral artery. Control angiogram was performed at the right common femoral artery puncture site. After the angiogram, right common femoral sheath was removed with deployment of Exoseal device. Patient tolerated the procedure well and remained hemodynamically stable throughout. No complications were encountered. Estimated blood loss approximately 50 cc. FINDINGS: Baseline angiogram Right common carotid artery:  Normal course caliber and contour. Right external carotid artery: Patent with antegrade flow. Significant flow through the superficial temporal branch, potentially contributing to collateral flow intracranial. Right internal carotid artery: Normal course caliber and contour of the cervical portion. Vertical and petrous segment patent with normal course caliber contour. Cavernous segment patent. Clinoid segment patent. Antegrade flow of the ophthalmic artery. Ophthalmic segment patent. Terminus patent. Right MCA: Proximal occlusion of the middle  cerebral artery. There is excellent collateral flow to the right hemisphere VA anterior cerebral artery. Right ACA: A 1 segment patent. A 2 segment perfuses the right territory. Baseline perfusion TICI 1, with excellent collateral perfusion. After the 1st pass, there is no significant improvement through the occlusion, with persistent TICI 1. After the 2nd pass, there is very minimal improvement in flow through the occluded segment, with persisting TICI 1 flow, and excellent collateral flow maintained. Vertebral origin injection demonstrates normal course contour and caliber of the right vertebral artery, with patent V3 segment. Cross flow from the left vertebral artery with partial washout in the  basilar artery. Patent posterior inferior cerebellar artery, anterior inferior cerebellar artery, and superior cerebral artery. Left carotid angiogram demonstrates normal course caliber and contour of the left CCA and cervical ICA. Patent external carotid artery. Suggestion of mild atherosclerotic changes at the origin of the A1 segment, with patent 82 segment. Patent middle cerebral artery with typical appearance of the arterial, late arterial, capillary and venous phase. IMPRESSION: Status post cerebral angiogram with attempted mechanical thrombectomy of right MCA occlusion. After 2 passes with local aspiration and stentreiver technology, there is persistent occlusion and TICI 1 flow. Excellent collateral flow perfusing the right MCA territory. My impression is that there is likely underlying stenosis at the site of occlusion given the collateral flow, and that further manipulation may cause dissection or hemorrhage. Given the excellent collateral flow and absence of core infarction on the perfusion imaging, we elected to defer further attempts at thrombectomy. Deployment of Exoseal for hemostasis. Signed, Yvone Neu. Loreta Ave, DO [facility] Radiology Electronically Signed    By: [name] D.O.   On: 03/11/2016 13:10   Ir Angio Vertebral Sel Vertebral Uni R Mod Sed  Result Date: 03/11/2016 INDICATION: 51 year old male presenting with acute left-sided symptoms and imaging evidence of right MCA emergent large vessel occlusion. CT imaging demonstrates normal aspects score, with CT perfusion shadowing elevated mean transit time with potential tissue at risk and no core infarct. EXAM: IR PERCUTANEOUS ART THORMBECTOMY/INFUSION INTRACRANIAL INCLUDE DIAG ANGIO; IR ANGIO VERTEBRAL SEL VERTEBRAL UNI RIGHT MOD SED; IR ANGIO INTRA EXTRACRAN SEL COM CAROTID INNOMINATE UNI LEFT MOD SED; IR ULTRASOUND GUIDANCE VASC ACCESS RIGHT COMPARISON:  No prior CT angiogram MEDICATIONS: 2.0 g Ancef. The antibiotic was administered within 1 hour of the procedure ANESTHESIA/SEDATION: Anesthesia team provided general anesthesia CONTRAST:  160 cc Isovue-300 FLUOROSCOPY TIME:  Fluoroscopy Time: 24 minutes 36 seconds (533 mGy). COMPLICATIONS: None TECHNIQUE: Informed written consent was obtained from the patient and the patient's family after a thorough discussion of the procedural risks, benefits and alternatives. Specific risks discussed include: Bleeding, infection, contrast reaction, kidney injury/failure, need for further procedure/surgery, arterial injury or dissection, embolization to new territory, intracranial hemorrhage (10-15% risk), neurologic deterioration, cardiopulmonary collapse, death. All questions were addressed. Maximal Sterile Barrier Technique was utilized including during the procedure including caps, mask, sterile gowns, sterile gloves, sterile drape, hand hygiene and skin antiseptic. A timeout was performed prior to the initiation of the procedure. Ultrasound survey of the right inguinal region was performed with images stored and sent to PACs. 11 blade scalpel was used to make a small incision. A micropuncture needle was used access the right common femoral artery under ultrasound. With  excellent arterial blood flow returned, an .018 micro wire was passed through the needle, observed to enter the abdominal aorta under fluoroscopy. The needle was removed, and a micropuncture sheath was placed over the wire. The inner dilator and wire were removed, and an 035 Bentson wire was advanced under fluoroscopy into the abdominal aorta. The sheath was removed and a standard 5 [place] vascular sheath was placed. The dilator was removed and the sheath was flushed. A 72F JB-1 diagnostic catheter was advanced over the wire to the proximal descending thoracic aorta. Wire was then removed. Double flush of the catheter was performed. Catheter was then used to select the innominate artery. Angiogram was performed. Using roadmap technique, the catheter was advanced over a roadrunner wire into cervical ICA. Formal angiogram was performed. Exchange length Rosen wire was then passed through the  diagnostic catheter to the distal cervical ICA and the diagnostic catheter was removed. The 5 French sheath was removed and exchanged for 8 French 55 centimeter BrightTip sheath. Sheath was flushed and attached to pressurized and heparinized saline bag for constant forward flow. Then a neuron Max 85cm catheter was prepared on the back table. Ace 68 intermediate catheter was then loaded though the Neuron Max catheter and advanced over the [facility] wire to the internal carotid artery. Wire was removed, and roadmap angiogram was performed. Microcatheter system was then introduced through the Ace catheter using a synchro soft 014 wire and a Trevo Provue18 catheter. Microcatheter system was advanced into the internal carotid artery, to the level of the occlusion. The micro wire was then carefully advanced through the occluded segment. Microcatheter was then push through the occluded segment and the wire was removed. Ace catheter was advanced over the micro wire to the ophthalmic ICA segment. Blood was then aspirated through the hub of the  microcatheter, and a gentle contrast injection was performed confirming intraluminal position. A rotating hemostatic valve was then attached to the back end of the microcatheter, and a pressurized and heparinized saline bag was attached to the catheter. 4 x 40 solitaire device was then selected. Back flush was achieved at the rotating hemostatic valve, and then the device was gently advanced through the microcatheter to the distal end. The retriever was then unsheathed by withdrawing the microcatheter under fluoroscopy. Once the retriever was completely unsheathed, control angiogram was performed from the balloon catheter. Constant aspiration was then performed through the Ace catheter as the retriever was gently and slowly withdrawn with fluoroscopic observation. Once the retriever was entirely removed from the system, free aspiration was confirmed at the hub of the intermediate catheter, with free blood return confirmed. Control angiogram was then performed. Persistent occlusion at the proximal MCA was confirmed. The microcatheter system was then advanced through the Ace catheter. On the 2nd attempt, a lateral view was required in order to guide the microcatheter system beyond the ophthalmic artery origin. Once the micro wire microcatheter were beyond the occlusion, the micro wire was removed and solitaire 4 x 40 device was deployed. After the solitaire was deployed across the occlusion, the Ace catheter was advanced to the M1 segment at the site of the occlusion. Local aspiration was performed upon withdrawal of the solitaire device under fluoroscopic observation. Once the retrieve her was entirely removed from the system, free aspiration was confirmed at the hub of the intermediate catheter, with free blood return confirmed. Control angiogram was again performed. Review of the available imaging was performed at this time. The intermediate catheter was removed and Neuro Max catheter were removed. Diagnostic  imaging was performed at the right vert origin. Angiogram of the left carotid system was also performed including intracranial images. Catheter was removed, and the bright tip sheath was exchanged for a short 8 French sheath at the right common femoral artery. Control angiogram was performed at the right common femoral artery puncture site. After the angiogram, right common femoral sheath was removed with deployment of Exoseal device. Patient tolerated the procedure well and remained hemodynamically stable throughout. No complications were encountered. Estimated blood loss approximately 50 cc. FINDINGS: Baseline angiogram Right common carotid artery:  Normal course caliber and contour. Right external carotid artery: Patent with antegrade flow. Significant flow through the superficial temporal branch, potentially contributing to collateral flow intracranial. Right internal carotid artery: Normal course caliber and contour of the cervical portion. Vertical and petrous  segment patent with normal course caliber contour. Cavernous segment patent. Clinoid segment patent. Antegrade flow of the ophthalmic artery. Ophthalmic segment patent. Terminus patent. Right MCA: Proximal occlusion of the middle cerebral artery. There is excellent collateral flow to the right hemisphere VA anterior cerebral artery. Right ACA: A 1 segment patent. A 2 segment perfuses the right territory. Baseline perfusion TICI 1, with excellent collateral perfusion. After the 1st pass, there is no significant improvement through the occlusion, with persistent TICI 1. After the 2nd pass, there is very minimal improvement in flow through the occluded segment, with persisting TICI 1 flow, and excellent collateral flow maintained. Vertebral origin injection demonstrates normal course contour and caliber of the right vertebral artery, with patent V3 segment. Cross flow from the left vertebral artery with partial washout in the basilar artery. Patent  posterior inferior cerebellar artery, anterior inferior cerebellar artery, and superior cerebral artery. Left carotid angiogram demonstrates normal course caliber and contour of the left CCA and cervical ICA. Patent external carotid artery. Suggestion of mild atherosclerotic changes at the origin of the A1 segment, with patent 82 segment. Patent middle cerebral artery with typical appearance of the arterial, late arterial, capillary and venous phase. IMPRESSION: Status post cerebral angiogram with attempted mechanical thrombectomy of right MCA occlusion. After 2 passes with local aspiration and stentreiver technology, there is persistent occlusion and TICI 1 flow. Excellent collateral flow perfusing the right MCA territory. My impression is that there is likely underlying stenosis at the site of occlusion given the collateral flow, and that further manipulation may cause dissection or hemorrhage. Given the excellent collateral flow and absence of core infarction on the perfusion imaging, we elected to defer further attempts at thrombectomy. Deployment of Exoseal for hemostasis. Signed, Yvone Neu. Loreta Ave, DO [facility] Radiology Electronically Signed   By: [name] D.O.   On: 03/11/2016 13:10     STUDIES:  CT Head [facility]) 2/14 >> concern for emergent large vessel occlusion CT Cerebral Perfusion 2/14 >> large area of right MCA territory penumbra CT Head w/o 2/14 >>   CULTURES:   ANTIBIOTICS: Ancef 2/14 >>   SIGNIFICANT EVENTS: 2/14  Admit with progressive L sided weakness, concern for emergent large vessel occlusion, to Neuro IR. Extubated on 2/14.   LINES/TUBES: ETT 2/14 >>   DISCUSSION: [name] admitted 2/14 with progressive left sided weakness, concern for emergent large vessel occlusion and taken to neuro IR, returned to ICU on mechanical ventilation. No active intervention done on 2/14.  Pt extubated on 2/14 and doing well. MRI with no  significant change.     ASSESSMENT / PLAN:  Assessment/Plan : Acute hypoxemic respiratory failure 2/2 unable to rpotect airway with IR procedure.  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   Tobacco abuse - counselled re: tobacco cessation  Best practice - SCDs - heparin SQ for DVT prophylaxis once OK with IR and Neuro       Family :Family updated at length today.  Discussed plan with pt's wife and daughter and pt.   PCCM to sign off for now.  Call back if with issues.    Pollie Meyer, MD 03/12/2016, 10:14 AM [place] [facility] Pager [phone] After 3 pm or if no answer, call [phone]

## 2016-03-12 NOTE — Progress Notes (Signed)
Interpreter Wyvonnia DuskyGraciela Namihira for Toniann FailWendy OT  And CarMaxKatie RN

## 2016-03-12 NOTE — Care Management Note (Signed)
Case Management Note  Patient Details  Name: Grant Garcia MRN: [id number] Date of Birth: 11/19/1965  Subjective/Objective:   Pt admitted on 03/11/16 s/p Rt MCA infarct.  PTA, pt independent and from home with wife.                   Action/Plan: PT/OT recommending CIR and consult ordered.  Will follow progress.    Expected Discharge Date:                  Expected Discharge Plan:  IP Rehab Facility  In-House Referral:     Discharge planning Services  CM Consult  Post Acute Care Choice:    Choice offered to:     DME Arranged:    DME Agency:     HH Arranged:    HH Agency:     Status of Service:  In process, will continue to follow  If discussed at Long Length of Stay Meetings, dates discussed:    Additional Comments:  Glennon Macmerson, Zacharias Ridling M, RN 03/12/2016, 5:29 PM

## 2016-03-12 NOTE — Evaluation (Signed)
Clinical/Bedside Swallow Evaluation Patient Details  Name: Grant Garcia MRN: [id number] Date of Birth: Sep 10, 1965  Today's Date: 03/12/2016 Time: SLP Start Time (ACUTE ONLY): 1056 SLP Stop Time (ACUTE ONLY): 1111 SLP Time Calculation (min) (ACUTE ONLY): 15 min  Past Medical History: No past medical history on file. Past Surgical History:  Past Surgical History:  Procedure Laterality Date  . IR GENERIC HISTORICAL  03/11/2016   IR ANGIO VERTEBRAL SEL VERTEBRAL UNI R MOD SED 03/11/2016 Gilmer MorJaime Wagner, DO [facility]  . IR GENERIC HISTORICAL  03/11/2016   IR ANGIO INTRA EXTRACRAN SEL COM CAROTID INNOMINATE UNI L MOD SED 03/11/2016 Gilmer MorJaime Wagner, DO [facility]  . IR GENERIC HISTORICAL  03/11/2016   IR PERCUTANEOUS ART THROMBECTOMY/INFUSION INTRACRANIAL INC DIAG ANGIO 03/11/2016 Gilmer MorJaime Wagner, DO [facility]  . IR GENERIC HISTORICAL  03/11/2016   IR US GUIDE VASC ACCESS RIGHT 03/11/2016 Gilmer MorJaime Wagner, DO [facility]   HPI:  [name], current smoker (1.5 - 2ppd x 30 years) with no PMH who [facility] with worsening left sided weakness. CT of the head that was concerning for emergent large vessel occlusion of the right MCA. MRI pending. He is s/p attempted thrombectomy but with persistent occlusion. He was intubated 2/14 for procedure only.    Assessment / Plan / Recommendation Clinical Impression  Pt has moderate sensorimotor deficits on the left side, resulting in weak labial seal and decreased awareness. He has both anterior loss and buccal pocketing, needing Mod cues to reduce. Multiple swallows are noted with cup sips of thin liquids, but his overall coordination appears improved with SLP intervention for use of straw. No overt s/s of aspiration were observed even with 3-oz water test. Recommend to initiate Dys 1 diet and thin liquids via straw with full supervision. Will f/u for tolerance and readiness to advance.    Aspiration Risk  Mild  aspiration risk;Moderate aspiration risk    Diet Recommendation Dysphagia 1 (Puree);Thin liquid   Liquid Administration via: Straw Medication Administration: Whole meds with puree Supervision: Staff to assist with self feeding;Full supervision/cueing for compensatory strategies Compensations: Slow rate;Minimize environmental distractions;Small sips/bites;Lingual sweep for clearance of pocketing;Monitor for anterior loss Postural Changes: Seated upright at 90 degrees    Other  Recommendations Oral Care Recommendations: Oral care BID Other Recommendations: Have oral suction available   Follow up Recommendations Inpatient Rehab      Frequency and Duration min 2x/week  2 weeks       Prognosis Prognosis for Safe Diet Advancement: Good      Swallow Study   General HPI: [name], current smoker (1.5 - 2ppd x 30 years) with no PMH who [facility] with worsening left sided weakness. CT of the head that was concerning for emergent large vessel occlusion of the right MCA. MRI pending. He is s/p attempted thrombectomy but with persistent occlusion. He was intubated 2/14 for procedure only.  Type of Study: Bedside Swallow Evaluation Previous Swallow Assessment: none in chart Diet Prior to this Study: NPO Temperature Spikes Noted: No Respiratory Status: Room air History of Recent Intubation: Yes Length of Intubations (days):  (for procedure/until sheath removed) Date extubated: 03/11/16 Behavior/Cognition: Alert;Cooperative Oral Cavity Assessment: Within Functional Limits Oral Care Completed by SLP: No Oral Cavity - Dentition: Adequate natural dentition Vision:  (keeps eyes mostly closed) Patient Positioning: Upright in chair Baseline Vocal Quality: Normal Volitional Swallow: Able to elicit    Oral/Motor/Sensory  Function Overall Oral Motor/Sensory Function: Moderate impairment Facial ROM: Reduced left;Suspected CN VII (facial)  dysfunction Facial Symmetry: Abnormal symmetry left;Suspected CN VII (facial) dysfunction Facial Strength: Reduced left;Suspected CN VII (facial) dysfunction Facial Sensation: Reduced left;Suspected CN V (Trigeminal) dysfunction Lingual ROM: Reduced left;Suspected CN XII (hypoglossal) dysfunction Lingual Symmetry: Abnormal symmetry left;Suspected CN XII (hypoglossal) dysfunction Lingual Strength: Suspected CN XII (hypoglossal) dysfunction;Reduced Lingual Sensation: Suspected CN VII (facial) dysfunction-anterior 2/3 tongue;Reduced   Ice Chips Ice chips: Within functional limits Presentation: Spoon   Thin Liquid Thin Liquid: Impaired Presentation: Cup;Spoon;Straw Oral Phase Impairments: Reduced labial seal;Reduced lingual movement/coordination Oral Phase Functional Implications: Left lateral sulci pocketing;Left anterior spillage Pharyngeal  Phase Impairments: Multiple swallows (with cup sips only)    Nectar Thick Nectar Thick Liquid: Not tested   Honey Thick Honey Thick Liquid: Not tested   Puree Puree: Impaired Presentation: Spoon Oral Phase Impairments: Reduced labial seal;Reduced lingual movement/coordination;Poor awareness of bolus Oral Phase Functional Implications: Left anterior spillage;Left lateral sulci pocketing   Solid   GO   Solid: Not tested        Maxcine Ham 03/12/2016,11:30 AM  Maxcine Ham, M.A. CCC-SLP [phone]

## 2016-03-12 NOTE — Progress Notes (Signed)
SLP Cancellation Note  Patient Details Name: Grant Garcia MRN: [id number] DOB: May 27, 1965   Cancelled treatment:       Reason Eval/Treat Not Completed: Patient not medically ready   Skylor Hughson, Riley NearingBonnie Caroline 03/12/2016, 8:15 AM

## 2016-03-13 LAB — CBC
HCT: 40.2 % (ref 39.0–52.0)
HEMOGLOBIN: 13.3 g/dL (ref 13.0–17.0)
MCH: 28.2 pg (ref 26.0–34.0)
MCHC: 33.1 g/dL (ref 30.0–36.0)
MCV: 85.2 fL (ref 78.0–100.0)
Platelets: 123 10*3/uL — ABNORMAL LOW (ref 150–400)
RBC: 4.72 MIL/uL (ref 4.22–5.81)
RDW: 13.2 % (ref 11.5–15.5)
WBC: 10.1 10*3/uL (ref 4.0–10.5)

## 2016-03-13 LAB — BASIC METABOLIC PANEL
ANION GAP: 8 (ref 5–15)
BUN: 11 mg/dL (ref 6–20)
CALCIUM: 9 mg/dL (ref 8.9–10.3)
CHLORIDE: 105 mmol/L (ref 101–111)
CO2: 26 mmol/L (ref 22–32)
CREATININE: 0.67 mg/dL (ref 0.61–1.24)
GFR calc non Af Amer: >60 mL/min (ref >60)
Glucose, Bld: 110 mg/dL — ABNORMAL HIGH (ref 65–99)
Potassium: 3.6 mmol/L (ref 3.5–5.1)
SODIUM: 139 mmol/L (ref 135–145)

## 2016-03-13 LAB — HEMOGLOBIN A1C
Hgb A1c MFr Bld: 5.6 % (ref 4.8–5.6)
Mean Plasma Glucose: 114 mg/dL

## 2016-03-13 NOTE — Progress Notes (Signed)
Interpreter Graciela Namihira for Wendy OT  °

## 2016-03-13 NOTE — Progress Notes (Signed)
Physical Therapy Treatment Patient Details Name: Grant Garcia MRN: [id number] DOB: [birth year] Today's Date: 03/13/2016    History of Present Illness Pt is 51 y.o. male, current smoker with no PMH who transferred to [facility] on 03/11/16 from [facility] ER with worsening left sided weakness. 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 Thrombectomy attempted inNeuro IR2/14/18 without further manipulation due to risk for dissection.  PMH includes Rt epidural hematoma with mass effect on Rt temporal lobe and s/p crani for evacuation, s/p Rt wrist fx, and s/p multiple facial fxs and orbit fx due to fall     PT Comments    Pt fatigued after being up in the chair and earlier session with OT.  On-site interpreter, Ashby Dawes, present to assist in communication.  Pt's wife also participating in session.  Pt was able to stand and tolerate pre-gait and working on weight shifting and finding midline.  Pt fatigued quickly, but continued to work hard for [place].  He is excited at the opportunity to go to CIR.  PT to follow acutely until d/c confirmed.     Follow Up Recommendations  CIR     Equipment Recommendations  Wheelchair (measurements PT);Wheelchair cushion (measurements PT)    Recommendations for Other Services   NA     Precautions / Restrictions Precautions Precautions: Fall Precaution Comments: left sided weakness    Mobility  Bed Mobility Overal bed mobility: Needs Assistance Bed Mobility: Sit to Supine     Supine to sit: Mod assist Sit to supine: +2 for physical assistance;Mod assist   General bed mobility comments: Two person mod assist to help control trunk and lift legs into bed.  Pt initiating movement, but unable to fully complete safel.    Transfers Overall transfer level: Needs assistance Equipment used: 1 person hand held assist Transfers: Sit to/from [employer] Sit to Stand: +2 safety/equipment;Mod assist Stand pivot  transfers: +2 physical assistance;Mod assist       General transfer comment: Assist needed for midline weight shift as pt is leaning heavily to the left in standing with left knee blocked.  used his wife for him to try to find midline and to help him look up.  Pt was able to try to self correct with cues via translator, but he was fatigueing and could not sustain long.  Transfer back to bed with assist at trunk and assist to move left leg backwards, pt was assisting with both strong side and to try to slide left leg back towards bed.  Pt transferred to his strong side.   Ambulation/Gait             General Gait Details: Pre gait only stepping forward and back with his left leg blocked.           Balance Overall balance assessment: Needs assistance Sitting-balance support: Feet supported;Single extremity supported Sitting balance-Leahy Scale: Poor Sitting balance - Comments: LOB to the left and pt unable to control it when trying to move with his right side.  Postural control: Left lateral lean Standing balance support: Single extremity supported Standing balance-Leahy Scale: Poor Standing balance comment: two person mod assist.                     Cognition Arousal/Alertness: Awake/alert Behavior During Therapy: WFL for tasks assessed/performed Overall Cognitive Status: Impaired/Different from baseline Area of Impairment: Safety/judgement;Awareness;Problem solving   Current Attention Level: Sustained   Following Commands: Follows one step commands consistently;Follows  multi-step commands inconsistently;Follows multi-step commands with increased time Safety/Judgement: Decreased awareness of safety;Decreased awareness of deficits Awareness: Intellectual Problem Solving: Slow processing;Difficulty sequencing;Requires verbal cues;Requires tactile cues General Comments: When asked, which way are you leaning in standing pt report Right.      Exercises Other  Exercises Other Exercises: worked on facilitation of reach with Lt UE for reach - able to reach for a cup with mod facilitation         Pertinent Vitals/Pain Pain Assessment: No/denies pain Faces Pain Scale: No hurt    Home Living     Available Help at Discharge: Family;Available 24 hours/day Type of Home: House     Home Layout:  (basement and one level)            PT Goals (current goals can now be found in the care plan section) Acute Rehab PT Goals Patient Stated Goal: return to normal  Progress towards PT goals: Progressing toward goals    Frequency    Min 4X/week      PT Plan Current plan remains appropriate       End of Session Equipment Utilized During Treatment: Gait belt Activity Tolerance: Patient limited by fatigue Patient left: in bed;with call bell/phone within reach;with bed alarm set;with family/visitor present     Time: [id number] PT Time Calculation (min) (ACUTE ONLY): 25 min  Charges:  $Therapeutic Activity: 8-22 mins $Neuromuscular Re-education: 8-22 mins                      Gwendalyn Mcgonagle B. Maxemiliano Riel, PT, DPT [phone]   03/13/2016, 5:01 PM

## 2016-03-13 NOTE — Progress Notes (Signed)
STROKE TEAM PROGRESS NOTE   SUBJECTIVE (INTERVAL HISTORY) Wife is at bedside. Pt neuro stable, still sleepy, still has left hemiplegia. Pending CIR tomorrow.   OBJECTIVE Temp:  [97.8 F (36.6 C)-98.7 F (37.1 C)] 98.2 F (36.8 C) (02/16 2108) Pulse Rate:  [41-71] 58 (02/16 2108) Cardiac Rhythm: Normal sinus rhythm (02/16 1900) Resp:  [15-24] 20 (02/16 2108) BP: (99-159)/(50-101) 123/86 (02/16 2108) SpO2:  [94 %-100 %] 96 % (02/16 2108) Weight:  [162 lb 4.1 oz (73.6 kg)] 162 lb 4.1 oz (73.6 kg) (02/16 0918)  CBC:   Recent Labs Lab 03/11/16 0920 03/12/16 0500 03/13/16 0354  WBC 10.5 17.5* 10.1  NEUTROABS 7.3  --   --   HGB 14.8 12.8* 13.3  HCT 44.1 38.7* 40.2  MCV 84.6 84.9 85.2  PLT 145* 146* 123*    Basic Metabolic Panel:   Recent Labs Lab 03/12/16 0500 03/13/16 0354  NA 139 139  K 3.6 3.6  CL 105 105  CO2 26 26  GLUCOSE 109* 110*  BUN 12 11  CREATININE 0.67 0.67  CALCIUM 8.9 9.0  MG 2.0  --   PHOS 3.6  --     Lipid Panel:     Component Value Date/Time   CHOL 190 03/12/2016 0500   TRIG 118 03/12/2016 0500   HDL 32 (L) 03/12/2016 0500   CHOLHDL 5.9 03/12/2016 0500   VLDL 24 03/12/2016 0500   LDLCALC 134 (H) 03/12/2016 0500   HgbA1c:  Lab Results  Component Value Date   HGBA1C 5.6 03/12/2016   Urine Drug Screen:     Component Value Date/Time   LABOPIA NONE DETECTED 03/12/2016 1611   COCAINSCRNUR NONE DETECTED 03/12/2016 1611   LABBENZ NONE DETECTED 03/12/2016 1611   AMPHETMU NONE DETECTED 03/12/2016 1611   THCU NONE DETECTED 03/12/2016 1611   LABBARB NONE DETECTED 03/12/2016 1611      IMAGING I have personally reviewed the radiological images below and agree with the radiology interpretations.  Ct Head Code Stroke Wo Contrast` 03/11/2016 1. Stable noncontrast CT appearance of the brain since 0544 hours today: Hypodensity in the right lentiform nucleus, but no other CT changes of right MCA infarct. No acute intracranial hemorrhage. 2.  ASPECTS is 9.    Ct Cerebral Perfusion W Contrast 03/11/2016 Large area of right MCA territory penumbra. Clinically suspected small volume core infarct in the right lentiform not detected. Overall very favorable CTP characteristics for endovascular reperfusion. I am advised that they are prepping for Neurointervention at the time of this dictation. Electronically Signed   By: Odessa FlemingH  Hall M.D.   On: 03/11/2016 09:27   Cerebral angio 03/11/2016 Status post cerebral angiogram with attempted mechanical thrombectomy of right MCA occlusion. After 2 passes with local aspiration and stentreiver technology, there is persistent occlusion and TICI 1 flow. Excellent collateral flow perfusing the right MCA territory. My impression is that there is likely underlying stenosis at the site of occlusion given the collateral flow, and that further manipulation may cause dissection or hemorrhage. Given the excellent collateral flow and absence of core infarction on the perfusion imaging, we elected to defer further attempts at thrombectomy. Deployment of Exoseal for hemostasis.   Ct Head Wo Contrast 03/11/2016 Stable postprocedural CT. No progression of the lenticulostriate infarct and no acute hemorrhage.   Mr Brain Wo Contrast 03/12/2016 Acute patchy multifocal RIGHT MCA territory infarct. No hemorrhage.   Mr Maxine GlennMra Head/brain Wo Cm 03/12/2016 Moderately motion degraded examination. RIGHT M1 occlusion, no collateral vessels by time-of-flight technique.  TTE - Left ventricle: The cavity size was normal. Systolic function was   normal. The estimated ejection fraction was in the range of 55%   to 60%. Wall motion was normal; there were no regional wall   motion abnormalities. Marked bradycardia (44 bpm) limits   evaluation of LV diastolic function.   PHYSICAL EXAM  Temp:  [97.8 F (36.6 C)-98.7 F (37.1 C)] 98.2 F (36.8 C) (02/16 2108) Pulse Rate:  [41-71] 58 (02/16 2108) Resp:  [15-24] 20 (02/16 2108) BP:  (99-159)/(50-101) 123/86 (02/16 2108) SpO2:  [94 %-100 %] 96 % (02/16 2108) Weight:  [162 lb 4.1 oz (73.6 kg)] 162 lb 4.1 oz (73.6 kg) (02/16 0918)  General - Well nourished, well developed, lethargic.  Ophthalmologic - Fundi not visualized due to noncooperation.  Cardiovascular - Regular rate and rhythm.  Mental Status -  Drowsy sleepy, barely open eyes on voice, did not answer questions for orientation. Spanish speaking only, moderate dysarthria, not cooperative on language exam.   Cranial Nerves II - XII - II - inconsistently blinking to visual threat due to lethargy. III, IV, VI - attending to both sides, but more right gaze preference but able to cross midline. V - Facial sensation intact bilaterally. VII - left significant facial droop. VIII - Hearing & vestibular intact bilaterally. X - Palate elevates symmetrically, moderate dysarthria. XI - Chin turning & shoulder shrug intact bilaterally. XII - Tongue protrusion intact.  Motor Strength - The patient's strength was normal in RUE and RLE, 0/5 LUE and 2/5 LLE, on pain stimulation, 2/5 withdraw of LUE.  Bulk was normal and fasciculations were absent.   Motor Tone - Muscle tone was assessed at the neck and appendages and was decreased on the left.  Reflexes - The patient's reflexes were 1+ in all extremities and he had no pathological reflexes.  Sensory - Light touch, temperature/pinprick were assessed and were symmetrical.    Coordination - not cooperative on exam.  Tremor was absent.  Gait and Station - not tested.   ASSESSMENT/PLAN Grant Garcia is a 51 y.o. male with  past medical history presenting with feeling weak all over. CT showed a right MCA infarct with large right penumbra.  He did not receive IV t-PA due to delay in arrival. 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   Stroke:  Patchy R MCA territory infarcts in setting of ? chronic R MCA stenosis/occlusion with  collateral from ACA. Infarcts secondary to large vessel atherosclerosis and failed collateral flow.  - however, pt significant weakness on the left not consistent with chronic MCA occlusion.   Resultant  Left hemiparesis, left facial droop  MRI  Patchy R MCA territory infarct  MRA  Right M1 cut off  Cerebral angio - ? Chronic right M1 occlusion with excellent collateral flow from right ACA  2D Echo  EF 55-60%  Recommend 30 day cardiac event monitoring to rule out afib. If no afib found, will need to consider TEE/loop recorder  LDL 134  HgbA1c 5.6  lovenox for VTE prophylaxis DIET - DYS 1 Room service appropriate? Yes; Fluid consistency: Thin  No antithrombotic prior to admission, now on aspirin 325 mg daily and clopidogrel 75 mg daily due to intracranial stenosis.   Patient counseled to be compliant with his antithrombotic medications  Ongoing aggressive stroke risk factor management  Therapy recommendations:  CIR  Disposition:  CIR admission tomorrow monring  Tobacco abuse  Current heavy smoker  Smoking cessation counseling will be provided  BP management  On the low side  Permissive hypertension (OK if < 220/120) but gradually normalize in 5-7 days  On IVF for BP   Long-term BP goal 130-150 due to right M1 occlusion  Hyperlipidemia  Home meds:  none  LDL 134, goal < 70  Add lipitor 80  Continue statin at discharge  Other Stroke Risk Factors  Suspect Obstructive sleep apnea - pt does snore during sleep but family not sure about apnea - will need outpt sleep study.  Other Active Problems  leukocystosis   Hospital day # 2   Marvel Plan, MD PhD Stroke Neurology 03/13/2016 11:04 PM   To contact Stroke Continuity provider, please refer to [email or web address]. After hours, contact General Neurology

## 2016-03-13 NOTE — Progress Notes (Signed)
I met with pt and his wife with interpreter as planned. We discussed an inpt rehab admission and they are in agreement, I will make the arrangements to admit tomorrow pending medical readiness. [phone]

## 2016-03-13 NOTE — H&P (Signed)
Physical Medicine and Rehabilitation Admission H&P    Chief complaint: Weakness  HPI: Grant Garcia is a 51 y.o. right handed Spanish speaking male on no prescription medications with history of tobacco abuse. Per chart review patient independent prior to admission living with family. 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 Presented to [place] [facility] with left-sided weakness. Patient was transferred to [facility] for further evaluation. 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  Patient did not receive TPA. Neuro interventional radiology consulted underwent thrombectomy. Echocardiogram with ejection fraction of 55% no wall motion abnormalities. Neurology consulted currently on Plavix and aspirin for CVA prophylaxis and workup ongoing. Subcutaneous Lovenox for DVT prophylaxis. Dysphagia #1 thin liquid diet. Physical and occupational therapy evaluations completed 03/12/2016 with recommendations of physical medicine rehabilitation consult. Patient was admitted for a comprehensive rehabilitation program  Review of Systems  Unable to perform ROS: Language   Past Medical History:  Diagnosis Date  . Medical history non-contributory    Past Surgical History:  Procedure Laterality Date  . IR GENERIC HISTORICAL  03/11/2016   IR ANGIO VERTEBRAL SEL VERTEBRAL UNI R MOD SED 03/11/2016 [name], DO [facility]  . IR GENERIC HISTORICAL  03/11/2016   IR ANGIO INTRA EXTRACRAN SEL COM CAROTID INNOMINATE UNI L MOD SED 03/11/2016 [name], DO [facility]  . IR GENERIC HISTORICAL  03/11/2016   IR PERCUTANEOUS ART THROMBECTOMY/INFUSION INTRACRANIAL INC DIAG ANGIO 03/11/2016 [name], DO [facility]  . IR GENERIC HISTORICAL  03/11/2016   IR US GUIDE VASC ACCESS RIGHT 03/11/2016 [name], DO [facility]  . RADIOLOGY WITH  ANESTHESIA N/A 03/11/2016   Procedure: RADIOLOGY WITH ANESTHESIA;  Surgeon: Medication Radiologist, MD;  Location: [place];  Service: Radiology;  Laterality: N/A;   History reviewed. No pertinent family history. Social History:  reports that he has been smoking Cigarettes.  He has a 60.00 pack-year smoking history. He has never used smokeless tobacco. He reports that he does not drink alcohol or use drugs. Allergies: No Known Allergies No prescriptions prior to admission.    Home: Home Living Family/patient expects to be discharged to:: Inpatient rehab Living Arrangements: Spouse/significant other, Children Available Help at Discharge: Family, Available 24 hours/day Type of Home: House Home Access: Level entry Home Layout: Able to live on main level with bedroom/bathroom Bathroom Shower/Tub: Tub/shower unit, Curtain Home Equipment: None   Functional History: Prior Function Level of Independence: Independent Comments: Pt works as Development worker, international aid.  Functional Status:  Mobility: Bed Mobility Overal bed mobility: Needs Assistance Bed Mobility: Sit to Supine Supine to sit: Min assist, HOB elevated Sit to supine: Max assist General bed mobility comments: assist to lower trunk to bed and to lift LEs onto bed  Transfers Overall transfer level: Needs assistance Equipment used: 1 person hand held assist Transfers: Sit to/from Stand, Stand Pivot Transfers Sit to Stand: Mod assist Stand pivot transfers: Mod assist General transfer comment: facilitation for anterior translation of trunk and assist to move into standing and assist for balance       ADL: ADL Overall ADL's : Needs assistance/impaired Grooming: Wash/dry hands, Wash/dry face, Brushing hair, Moderate assistance, Sitting Grooming Details (indicate cue type and reason): assist to comb left side  Upper Body Bathing: Moderate assistance, Sitting Lower Body Bathing: Maximal assistance, Sit to/from stand Upper Body Dressing :  Maximal assistance,  Sitting Lower Body Dressing: Total assistance, Sit to/from stand Toilet Transfer: Moderate assistance, Stand-pivot, BSC Toileting- Clothing Manipulation and Hygiene: Total assistance, Sit to/from stand Functional mobility during ADLs: Moderate assistance General ADL Comments: Pt neglecting Lt side q  Cognition: Cognition Overall Cognitive Status: Impaired/Different from baseline Orientation Level: Oriented to person, Oriented to place, Oriented to time, Disoriented to situation Cognition Arousal/Alertness: Awake/alert, Lethargic Behavior During Therapy: Flat affect Overall Cognitive Status: Impaired/Different from baseline Area of Impairment: Orientation, Attention, Following commands, Safety/judgement, Awareness, Problem solving Orientation Level: Disoriented to, Situation, Place Current Attention Level: Sustained (up to 45 seconds with min cues ) Following Commands: Follows one step commands consistently, Follows multi-step commands with increased time Safety/Judgement: Decreased awareness of safety, Decreased awareness of deficits Awareness:  (unable to state he had a stroke ) Problem Solving: Slow processing, Difficulty sequencing, Requires verbal cues, Requires tactile cues General Comments: Pt knows he's in a hospital, but thinks he's in [facility].   When asked, he states he's in hospital because he's sick.  He states he is normal with no deficits.   Physical Exam: Blood pressure 132/71, pulse (!) 49, temperature 98.5 F (36.9 C), temperature source Oral, resp. rate 16, height 5' 6"  (1.676 m), weight 73.6 kg (162 lb 4.1 oz), SpO2 96 %. Physical Exam  Vitals reviewed. Constitutional: He appears well-developed. No distress.  HENT:  Head: Normocephalic and atraumatic.  Eyes: EOM are normal. Left eye exhibits no discharge.  Neck: Normal range of motion. Neck supple. No JVD present. No thyromegaly present.  Cardiovascular: Normal rate and regular rhythm.   Exam reveals no gallop.   No murmur heard. Respiratory: Effort normal and breath sounds normal. No respiratory distress. He has no wheezes. He has no rales.  GI: Soft. Bowel sounds are normal. He exhibits no distension. There is no tenderness. There is no rebound.  Musculoskeletal: Normal range of motion.  Skin: Skin is warm and dry. He is not diaphoretic.  Skin. Warm and dry Neurological:   Patient primarily Spanish-speaking. Speech slurred.  Able to follow basic commands when spoken in [place].  RUE and RLE motor 5/5. LUE and LLE remain 0/5 prox to distal with mild flexor tone pattern LUE. Left inattention. Decreased sensation to LT/has some sense of pain in LLE and withdraws to pinch.  Left central 7. Cognitively appears to have reasonable insight and awareness. Comprehends simple comments when spoken to in [place].   Results for orders placed or performed during the hospital encounter of 03/11/16 (from the past 48 hour(s))  Triglycerides     Status: None   Collection Time: 03/11/16  1:00 PM  Result Value Ref Range   Triglycerides 142 <150 mg/dL  MRSA PCR Screening     Status: None   Collection Time: 03/11/16  1:56 PM  Result Value Ref Range   MRSA by PCR NEGATIVE NEGATIVE    Comment:        The GeneXpert MRSA Assay (FDA approved for NASAL specimens only), is one component of a comprehensive MRSA colonization surveillance program. It is not intended to diagnose MRSA infection nor to guide or monitor treatment for MRSA infections.   Draw ABG 1 hour after initiation of ventilator     Status: Abnormal   Collection Time: 03/11/16  2:30 PM  Result Value Ref Range   FIO2 1.00    Delivery systems VENTILATOR    Mode PRESSURE REGULATED VOLUME CONTROL    VT 510.0 mL   LHR 14.0 resp/min   Peep/cpap 5.0  cm H20   pH, Arterial 7.424 7.350 - 7.450   pCO2 arterial 35.7 32.0 - 48.0 mmHg   pO2, Arterial 394 (H) 83.0 - 108.0 mmHg   Bicarbonate 23.1 20.0 - 28.0 mmol/L   Acid-base deficit  0.8 0.0 - 2.0 mmol/L   O2 Saturation 99.8 %   Patient temperature 97.8    Collection site A-LINE    Drawn by [id number]    Sample type ARTERIAL DRAW    Allens test (pass/fail) PASS PASS  Basic metabolic panel     Status: Abnormal   Collection Time: 03/11/16  4:23 PM  Result Value Ref Range   Sodium 141 135 - 145 mmol/L   Potassium 3.8 3.5 - 5.1 mmol/L   Chloride 109 101 - 111 mmol/L   CO2 23 22 - 32 mmol/L   Glucose, Bld 151 (H) 65 - 99 mg/dL   BUN 9 6 - 20 mg/dL   Creatinine, Ser 0.67 0.61 - 1.24 mg/dL   Calcium 8.8 (L) 8.9 - 10.3 mg/dL   GFR calc non Af Amer >60 >60 mL/min   GFR calc Af Amer >60 >60 mL/min    Comment: (NOTE) The eGFR has been calculated using the CKD EPI equation. This calculation has not been validated in all clinical situations. eGFR's persistently <60 mL/min signify possible Chronic Kidney Disease.    Anion gap 9 5 - 15  Hemoglobin A1c     Status: None   Collection Time: 03/12/16  5:00 AM  Result Value Ref Range   Hgb A1c MFr Bld 5.6 4.8 - 5.6 %    Comment: (NOTE)         Pre-diabetes: 5.7 - 6.4         Diabetes: >6.4         Glycemic control for adults with diabetes: <7.0    Mean Plasma Glucose 114 mg/dL    Comment: (NOTE) Performed At: [facility] [place], [place] [id number] Lindon Romp MD [id number]   Lipid panel     Status: Abnormal   Collection Time: 03/12/16  5:00 AM  Result Value Ref Range   Cholesterol 190 0 - 200 mg/dL   Triglycerides 118 <150 mg/dL   HDL 32 (L) >40 mg/dL   Total CHOL/HDL Ratio 5.9 RATIO   VLDL 24 0 - 40 mg/dL   LDL Cholesterol 134 (H) 0 - 99 mg/dL    Comment:        Total Cholesterol/HDL:CHD Risk Coronary Heart Disease Risk Table                     Men   Women  1/2 Average Risk   3.4   3.3  Average Risk       5.0   4.4  2 X Average Risk   9.6   7.1  3 X Average Risk  23.4   11.0        Use the calculated Patient Ratio above and the CHD Risk Table to determine the patient's CHD  Risk.        ATP III CLASSIFICATION (LDL):  <100     mg/dL   Optimal  100-129  mg/dL   Near or Above                    Optimal  130-159  mg/dL   Borderline  160-189  mg/dL   High  >190     mg/dL   Very High  CBC     Status: Abnormal   Collection Time: 03/12/16  5:00 AM  Result Value Ref Range   WBC 17.5 (H) 4.0 - 10.5 K/uL   RBC 4.56 4.22 - 5.81 MIL/uL   Hemoglobin 12.8 (L) 13.0 - 17.0 g/dL   HCT 38.7 (L) 39.0 - 52.0 %   MCV 84.9 78.0 - 100.0 fL   MCH 28.1 26.0 - 34.0 pg   MCHC 33.1 30.0 - 36.0 g/dL   RDW 13.3 11.5 - 15.5 %   Platelets 146 (L) 150 - 400 K/uL  Magnesium     Status: None   Collection Time: 03/12/16  5:00 AM  Result Value Ref Range   Magnesium 2.0 1.7 - 2.4 mg/dL  Phosphorus     Status: None   Collection Time: 03/12/16  5:00 AM  Result Value Ref Range   Phosphorus 3.6 2.5 - 4.6 mg/dL  Basic metabolic panel     Status: Abnormal   Collection Time: 03/12/16  5:00 AM  Result Value Ref Range   Sodium 139 135 - 145 mmol/L   Potassium 3.6 3.5 - 5.1 mmol/L   Chloride 105 101 - 111 mmol/L   CO2 26 22 - 32 mmol/L   Glucose, Bld 109 (H) 65 - 99 mg/dL   BUN 12 6 - 20 mg/dL   Creatinine, Ser 0.67 0.61 - 1.24 mg/dL   Calcium 8.9 8.9 - 10.3 mg/dL   GFR calc non Af Amer >60 >60 mL/min   GFR calc Af Amer >60 >60 mL/min    Comment: (NOTE) The eGFR has been calculated using the CKD EPI equation. This calculation has not been validated in all clinical situations. eGFR's persistently <60 mL/min signify possible Chronic Kidney Disease.    Anion gap 8 5 - 15  Rapid urine drug screen (hospital performed)     Status: None   Collection Time: 03/12/16  4:11 PM  Result Value Ref Range   Opiates NONE DETECTED NONE DETECTED   Cocaine NONE DETECTED NONE DETECTED   Benzodiazepines NONE DETECTED NONE DETECTED   Amphetamines NONE DETECTED NONE DETECTED   Tetrahydrocannabinol NONE DETECTED NONE DETECTED   Barbiturates NONE DETECTED NONE DETECTED    Comment:        DRUG SCREEN  FOR MEDICAL PURPOSES ONLY.  IF CONFIRMATION IS NEEDED FOR ANY PURPOSE, NOTIFY LAB WITHIN 5 DAYS.        LOWEST DETECTABLE LIMITS FOR URINE DRUG SCREEN Drug Class       Cutoff (ng/mL) Amphetamine      1000 Barbiturate      200 Benzodiazepine   546 Tricyclics       270 Opiates          300 Cocaine          300 THC              50   CBC     Status: Abnormal   Collection Time: 03/13/16  3:54 AM  Result Value Ref Range   WBC 10.1 4.0 - 10.5 K/uL   RBC 4.72 4.22 - 5.81 MIL/uL   Hemoglobin 13.3 13.0 - 17.0 g/dL   HCT 40.2 39.0 - 52.0 %   MCV 85.2 78.0 - 100.0 fL   MCH 28.2 26.0 - 34.0 pg   MCHC 33.1 30.0 - 36.0 g/dL   RDW 13.2 11.5 - 15.5 %   Platelets 123 (L) 150 - 400 K/uL  Basic metabolic panel     Status: Abnormal   Collection  Time: 03/13/16  3:54 AM  Result Value Ref Range   Sodium 139 135 - 145 mmol/L   Potassium 3.6 3.5 - 5.1 mmol/L   Chloride 105 101 - 111 mmol/L   CO2 26 22 - 32 mmol/L   Glucose, Bld 110 (H) 65 - 99 mg/dL   BUN 11 6 - 20 mg/dL   Creatinine, Ser 0.67 0.61 - 1.24 mg/dL   Calcium 9.0 8.9 - 10.3 mg/dL   GFR calc non Af Amer >60 >60 mL/min   GFR calc Af Amer >60 >60 mL/min    Comment: (NOTE) The eGFR has been calculated using the CKD EPI equation. This calculation has not been validated in all clinical situations. eGFR's persistently <60 mL/min signify possible Chronic Kidney Disease.    Anion gap 8 5 - 15   Ct Head Wo Contrast  Result Date: 03/11/2016 CLINICAL DATA:  Status post mechanical thrombectomy. EXAM: CT HEAD WITHOUT CONTRAST TECHNIQUE: Contiguous axial images were obtained from the base of the skull through the vertex without intravenous contrast. COMPARISON:  Head CT from earlier today FINDINGS: Brain: Stable perforator type infarct in the right basal ganglia and internal capsule. No evidence of infarct progression. No acute hemorrhage. No hydrocephalus or mass. Vascular: Reason intravenous contrast. No detectable asymmetric enhancement.  Skull: Remote craniotomy on the right for hematoma evacuation. Sinuses/Orbits: Nasopharyngeal fluid in the setting of intubation. IMPRESSION: Stable postprocedural CT. No progression of the lenticulostriate infarct and no acute hemorrhage. Electronically Signed   By: Monte Fantasia M.D.   On: 03/11/2016 12:41   Mr Brain Wo Contrast  Result Date: 03/12/2016 CLINICAL DATA:  Known RIGHT M1 occlusion, follow-up stroke. EXAM: MRI HEAD WITHOUT CONTRAST MRA HEAD WITHOUT CONTRAST TECHNIQUE: Multiplanar, multiecho pulse sequences of the brain and surrounding structures were obtained without intravenous contrast. Angiographic images of the head were obtained using MRA technique without contrast. Patient was unable to tolerate further imaging, coronal T2 not seen. COMPARISON:  CT HEAD March 11, 2016 FINDINGS: MRI HEAD FINDINGS- mildly motion degraded examination. BRAIN: Patchy reduced diffusion RIGHT basal ganglia, RIGHT frontotemporal parietal lobes with corresponding low ADC values faint clear T2 hyperintense signal. No susceptibility artifact to suggest hemorrhage. No susceptibility artifact to suggest hemorrhage. The ventricles and sulci are normal for patient's age. No suspicious parenchymal signal, masses or mass effect. No abnormal extra-axial fluid collections. VASCULAR: Normal major intracranial vascular flow voids present at skull base. SKULL AND UPPER CERVICAL SPINE: No abnormal sellar expansion. No suspicious calvarial bone marrow signal. Craniocervical junction maintained. SINUSES/ORBITS: The mastoid air-cells and included paranasal sinuses are well-aerated. The included ocular globes and orbital contents are non-suspicious. OTHER: None. MRA HEAD FINDINGS- moderately motion degraded ANTERIOR CIRCULATION: Flow related enhancement bilateral internal carotid arteries, apparent mild narrowing RIGHT cavernous internal carotid artery, the vessel is patent on prior CT and this is attributable to calcific  atherosclerosis. Chronically occluded proximal RIGHT M1 segments, no collateral vessels identified on time-of-flight MRA. LEFT middle cerebral artery and bilateral anterior cerebral arteries are patent. POSTERIOR CIRCULATION: Bilateral vertebral arteries, basilar artery are patent. Limited assessment of the main branch vessels due to patient motion. Patent bilateral posterior cerebral arteries. Small RIGHT posterior communicating artery present as seen on prior CTA. ANATOMIC VARIANTS: None. 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 No hemorrhage. MRA HEAD: Moderately motion degraded examination. RIGHT M1 occlusion, no collateral vessels by time-of-flight technique. Electronically Signed   By: Elon Alas M.D.   On: 03/12/2016 05:07   Portable Chest Xray  Result Date: 03/12/2016 CLINICAL DATA:  Respiratory failure.  Stroke. EXAM: PORTABLE CHEST 1 VIEW COMPARISON:  03/11/2016. FINDINGS: Interim extubation. Mild narrowing of the upper trachea noted. Trachea stenosis or paratracheal process cannot be excluded. Mediastinum hilar structures normal. Cardiomegaly. Mild pulmonary venous congestion. No focal infiltrate. No pleural effusion or pneumothorax. No acute bony abnormality . IMPRESSION: 1. Interim extubation. Mild narrowing of the upper trachea noted. Tracheal stenosis or peritracheal process cannot be excluded . 2. Cardiomegaly with mild pulmonary venous congestion. No overt pulmonary edema. Electronically Signed   By: Marcello Moores  Register   On: 03/12/2016 07:25   Portable Chest Xray  Result Date: 03/11/2016 CLINICAL DATA:  Hypoxia EXAM: PORTABLE CHEST 1 VIEW COMPARISON:  None. FINDINGS: Endotracheal tube tip is 2.9 cm above the carina. No pneumothorax. There is no edema or consolidation. Heart size and pulmonary vascularity are normal. No adenopathy. No bone lesions. IMPRESSION: Endotracheal tube as described without pneumothorax. No edema or consolidation. Electronically  Signed   By: Lowella Grip III M.D.   On: 03/11/2016 15:56   Mr Jodene Nam Head/brain BS Cm  Result Date: 03/12/2016 CLINICAL DATA:  Known RIGHT M1 occlusion, follow-up stroke. EXAM: MRI HEAD WITHOUT CONTRAST MRA HEAD WITHOUT CONTRAST TECHNIQUE: Multiplanar, multiecho pulse sequences of the brain and surrounding structures were obtained without intravenous contrast. Angiographic images of the head were obtained using MRA technique without contrast. Patient was unable to tolerate further imaging, coronal T2 not seen. COMPARISON:  CT HEAD March 11, 2016 FINDINGS: MRI HEAD FINDINGS- mildly motion degraded examination. BRAIN: Patchy reduced diffusion RIGHT basal ganglia, RIGHT frontotemporal parietal lobes with corresponding low ADC values faint clear T2 hyperintense signal. No susceptibility artifact to suggest hemorrhage. No susceptibility artifact to suggest hemorrhage. The ventricles and sulci are normal for patient's age. No suspicious parenchymal signal, masses or mass effect. No abnormal extra-axial fluid collections. VASCULAR: Normal major intracranial vascular flow voids present at skull base. SKULL AND UPPER CERVICAL SPINE: No abnormal sellar expansion. No suspicious calvarial bone marrow signal. Craniocervical junction maintained. SINUSES/ORBITS: The mastoid air-cells and included paranasal sinuses are well-aerated. The included ocular globes and orbital contents are non-suspicious. OTHER: None. MRA HEAD FINDINGS- moderately motion degraded ANTERIOR CIRCULATION: Flow related enhancement bilateral internal carotid arteries, apparent mild narrowing RIGHT cavernous internal carotid artery, the vessel is patent on prior CT and this is attributable to calcific atherosclerosis. Chronically occluded proximal RIGHT M1 segments, no collateral vessels identified on time-of-flight MRA. LEFT middle cerebral artery and bilateral anterior cerebral arteries are patent. POSTERIOR CIRCULATION: Bilateral vertebral  arteries, basilar artery are patent. Limited assessment of the main branch vessels due to patient motion. Patent bilateral posterior cerebral arteries. Small RIGHT posterior communicating artery present as seen on prior CTA. ANATOMIC VARIANTS: None. 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 No hemorrhage. MRA HEAD: Moderately motion degraded examination. RIGHT M1 occlusion, no collateral vessels by time-of-flight technique. Electronically Signed   By: Elon Alas M.D.   On: 03/12/2016 05:07       Medical Problem List and Plan: 1.  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  DVT Prophylaxis/Anticoagulation: Subcutaneous Lovenox. Monitor platelet counts and any signs of bleeding 3. Pain Management: Tylenol as needed 4. Mood: Provide emotional support 5. Neuropsych: This patient is capable of making decisions on his own behalf. 6. Skin/Wound Care: Routine skin checks 7. Fluids/Electrolytes/Nutrition: Routine I&O with follow-up chemistries  -  encourage PO 8. Dysphagia. Dysphagia #1 thin liquid diet. Monitor for any aspiration. Hollow up speech therapy 9. Hyperlipidemia. Lipitor 10. Tobacco abuse. Counseling    Post Admission Physician Evaluation: 1. Functional deficits secondary  to right MCA infarct. 2. Patient is admitted to receive collaborative, interdisciplinary care between the physiatrist, rehab nursing staff, and therapy team. 3. Patient's level of medical complexity and substantial therapy needs in context of that medical necessity cannot be provided at a lesser intensity of care such as a SNF. 4. Patient has experienced substantial functional loss from his/her baseline which was documented above under the "Functional History" and "Functional Status" headings.  Judging by the patient's diagnosis, physical exam, and functional  history, the patient has potential for functional progress which will result in measurable gains while on inpatient rehab.  These gains will be of substantial and practical use upon discharge  in facilitating mobility and self-care at the household level. 5. Physiatrist will provide 24 hour management of medical needs as well as oversight of the therapy plan/treatment and provide guidance as appropriate regarding the interaction of the two. 6. The Preadmission Screening has been reviewed and patient status is unchanged unless otherwise stated above. 7. 24 hour rehab nursing will assist with bladder management, bowel management, safety, skin/wound care, disease management, medication administration, pain management and patient education  and help integrate therapy concepts, techniques,education, etc. 8. PT will assess and treat for/with: Lower extremity strength, range of motion, stamina, balance, functional mobility, safety, adaptive techniques and equipment, NMR, visual-spatial awareness, family education.   Goals are: min to mod assist. 9. OT will assess and treat for/with: ADL's, functional mobility, safety, upper extremity strength, adaptive techniques and equipment, NMR, cognitve perceptual rx, family ed.   Goals are: min to mod assist. Therapy may proceed with showering this patient. 10. SLP will assess and treat for/with: speech, swallowing, communication, cognition.  Goals are: supervision. 11. Case Management and Social Worker will assess and treat for psychological issues and discharge planning. 12. Team conference will be held weekly to assess progress toward goals and to determine barriers to discharge. 13. Patient will receive at least 3 hours of therapy per day at least 5 days per week. 14. ELOS: 20-27 days       15. Prognosis:  excellent     Meredith Staggers, MD, [facility] [facility] 03/14/2016    Cathlyn Parsons., PA-C 03/13/2016

## 2016-03-13 NOTE — Progress Notes (Signed)
Interpreter Wyvonnia DuskyGraciela Namihira for [facility] [employer]

## 2016-03-13 NOTE — Progress Notes (Signed)
Referring Physician(s): DR Jerel Shepherd  Supervising Physician: [name]  Patient Status:  [facility] - In-pt  Chief Complaint:  CVA R MCA revasc attempt in IR 2/14 Collateral filling    Subjective:  Moving Rt well Good strength and follow all commands No movement on left  Allergies: Patient has no known allergies.  Medications: Prior to Admission medications   Not on File     Vital Signs: BP 132/71 (BP Location: Right Arm)   Pulse (!) 49   Temp 98.5 F (36.9 C) (Oral)   Resp 16   Ht 5\' 6"  (1.676 m)   Wt 162 lb 4.1 oz (73.6 kg)   SpO2 96%   BMI 26.19 kg/m   Physical Exam  HENT:  Head: Atraumatic.  Eyes:  Left eyelid ptosis Left facial droop Tongue to Rt  Musculoskeletal:  Good movement and strength Rt follows all commands No movement on Lt  Neurological: He is alert.  Skin: Skin is dry.  Rt groin NT no bleeding No hematoma 2+ Pulses  Nursing note and vitals reviewed.   Imaging: Ct Head Wo Contrast  Result Date: 03/11/2016 CLINICAL DATA:  Status post mechanical thrombectomy. EXAM: CT HEAD WITHOUT CONTRAST TECHNIQUE: Contiguous axial images were obtained from the base of the skull through the vertex without intravenous contrast. COMPARISON:  Head CT from earlier today FINDINGS: Brain: Stable perforator type infarct in the right basal ganglia and internal capsule. No evidence of infarct progression. No acute hemorrhage. No hydrocephalus or mass. Vascular: Reason intravenous contrast. No detectable asymmetric enhancement. Skull: Remote craniotomy on the right for hematoma evacuation. Sinuses/Orbits: Nasopharyngeal fluid in the setting of intubation. IMPRESSION: Stable postprocedural CT. No progression of the lenticulostriate infarct and no acute hemorrhage. Electronically Signed   By: Marnee Spring M.D.   On: 03/11/2016 12:41   Mr Brain Wo Contrast  Result Date: 03/12/2016 CLINICAL DATA:  Known RIGHT M1 occlusion, follow-up stroke. EXAM: MRI HEAD WITHOUT  CONTRAST MRA HEAD WITHOUT CONTRAST TECHNIQUE: Multiplanar, multiecho pulse sequences of the brain and surrounding structures were obtained without intravenous contrast. Angiographic images of the head were obtained using MRA technique without contrast. Patient was unable to tolerate further imaging, coronal T2 not seen. COMPARISON:  CT HEAD March 11, 2016 FINDINGS: MRI HEAD FINDINGS- mildly motion degraded examination. BRAIN: Patchy reduced diffusion RIGHT basal ganglia, RIGHT frontotemporal parietal lobes with corresponding low ADC values faint clear T2 hyperintense signal. No susceptibility artifact to suggest hemorrhage. No susceptibility artifact to suggest hemorrhage. The ventricles and sulci are normal for patient's age. No suspicious parenchymal signal, masses or mass effect. No abnormal extra-axial fluid collections. VASCULAR: Normal major intracranial vascular flow voids present at skull base. SKULL AND UPPER CERVICAL SPINE: No abnormal sellar expansion. No suspicious calvarial bone marrow signal. Craniocervical junction maintained. SINUSES/ORBITS: The mastoid air-cells and included paranasal sinuses are well-aerated. The included ocular globes and orbital contents are non-suspicious. OTHER: None. MRA HEAD FINDINGS- moderately motion degraded ANTERIOR CIRCULATION: Flow related enhancement bilateral internal carotid arteries, apparent mild narrowing RIGHT cavernous internal carotid artery, the vessel is patent on prior CT and this is attributable to calcific atherosclerosis. Chronically occluded proximal RIGHT M1 segments, no collateral vessels identified on time-of-flight MRA. LEFT middle cerebral artery and bilateral anterior cerebral arteries are patent. POSTERIOR CIRCULATION: Bilateral vertebral arteries, basilar artery are patent. Limited assessment of the main branch vessels due to patient motion. Patent bilateral posterior cerebral arteries. Small RIGHT posterior communicating artery present as  seen on prior CTA. ANATOMIC VARIANTS:  None. 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 No hemorrhage. MRA HEAD: Moderately motion degraded examination. RIGHT M1 occlusion, no collateral vessels by time-of-flight technique. Electronically Signed   By: Awilda Metro M.D.   On: 03/12/2016 05:07   Ir US Guide Vasc Access Right  Result Date: 03/11/2016 INDICATION: 51 year old male presenting with acute left-sided symptoms and imaging evidence of right MCA emergent large vessel occlusion. CT imaging demonstrates normal aspects score, with CT perfusion shadowing elevated mean transit time with potential tissue at risk and no core infarct. EXAM: IR PERCUTANEOUS ART THORMBECTOMY/INFUSION INTRACRANIAL INCLUDE DIAG ANGIO; IR ANGIO VERTEBRAL SEL VERTEBRAL UNI RIGHT MOD SED; IR ANGIO INTRA EXTRACRAN SEL COM CAROTID INNOMINATE UNI LEFT MOD SED; IR ULTRASOUND GUIDANCE VASC ACCESS RIGHT COMPARISON:  No prior CT angiogram MEDICATIONS: 2.0 g Ancef. The antibiotic was administered within 1 hour of the procedure ANESTHESIA/SEDATION: Anesthesia team provided general anesthesia CONTRAST:  160 cc Isovue-300 FLUOROSCOPY TIME:  Fluoroscopy Time: 24 minutes 36 seconds (533 mGy). COMPLICATIONS: None TECHNIQUE: Informed written consent was obtained from the patient and the patient's family after a thorough discussion of the procedural risks, benefits and alternatives. Specific risks discussed include: Bleeding, infection, contrast reaction, kidney injury/failure, need for further procedure/surgery, arterial injury or dissection, embolization to new territory, intracranial hemorrhage (10-15% risk), neurologic deterioration, cardiopulmonary collapse, death. All questions were addressed. Maximal Sterile Barrier Technique was utilized including during the procedure including caps, mask, sterile gowns, sterile gloves, sterile drape, hand hygiene and skin antiseptic. A timeout was performed prior to the  initiation of the procedure. Ultrasound survey of the right inguinal region was performed with images stored and sent to PACs. 11 blade scalpel was used to make a small incision. A micropuncture needle was used access the right common femoral artery under ultrasound. With excellent arterial blood flow returned, an .018 micro wire was passed through the needle, observed to enter the abdominal aorta under fluoroscopy. The needle was removed, and a micropuncture sheath was placed over the wire. The inner dilator and wire were removed, and an 035 Bentson wire was advanced under fluoroscopy into the abdominal aorta. The sheath was removed and a standard 5 [place] vascular sheath was placed. The dilator was removed and the sheath was flushed. A 32F JB-1 diagnostic catheter was advanced over the wire to the proximal descending thoracic aorta. Wire was then removed. Double flush of the catheter was performed. Catheter was then used to select the innominate artery. Angiogram was performed. Using roadmap technique, the catheter was advanced over a roadrunner wire into cervical ICA. Formal angiogram was performed. Exchange length Rosen wire was then passed through the diagnostic catheter to the distal cervical ICA and the diagnostic catheter was removed. The 5 French sheath was removed and exchanged for 8 French 55 centimeter BrightTip sheath. Sheath was flushed and attached to pressurized and heparinized saline bag for constant forward flow. Then a neuron Max 85cm catheter was prepared on the back table. Ace 68 intermediate catheter was then loaded though the Neuron Max catheter and advanced over the [facility] wire to the internal carotid artery. Wire was removed, and roadmap angiogram was performed. Microcatheter system was then introduced through the Ace catheter using a synchro soft 014 wire and a Trevo Provue18 catheter. Microcatheter system was advanced into the internal carotid artery, to the level of the occlusion. The micro  wire was then carefully advanced through the occluded segment. Microcatheter was then push through the occluded segment and the wire was removed. Ace  catheter was advanced over the micro wire to the ophthalmic ICA segment. Blood was then aspirated through the hub of the microcatheter, and a gentle contrast injection was performed confirming intraluminal position. A rotating hemostatic valve was then attached to the back end of the microcatheter, and a pressurized and heparinized saline bag was attached to the catheter. 4 x 40 solitaire device was then selected. Back flush was achieved at the rotating hemostatic valve, and then the device was gently advanced through the microcatheter to the distal end. The retriever was then unsheathed by withdrawing the microcatheter under fluoroscopy. Once the retriever was completely unsheathed, control angiogram was performed from the balloon catheter. Constant aspiration was then performed through the Ace catheter as the retriever was gently and slowly withdrawn with fluoroscopic observation. Once the retriever was entirely removed from the system, free aspiration was confirmed at the hub of the intermediate catheter, with free blood return confirmed. Control angiogram was then performed. Persistent occlusion at the proximal MCA was confirmed. The microcatheter system was then advanced through the Ace catheter. On the 2nd attempt, a lateral view was required in order to guide the microcatheter system beyond the ophthalmic artery origin. Once the micro wire microcatheter were beyond the occlusion, the micro wire was removed and solitaire 4 x 40 device was deployed. After the solitaire was deployed across the occlusion, the Ace catheter was advanced to the M1 segment at the site of the occlusion. Local aspiration was performed upon withdrawal of the solitaire device under fluoroscopic observation. Once the retrieve her was entirely removed from the system, free aspiration was  confirmed at the hub of the intermediate catheter, with free blood return confirmed. Control angiogram was again performed. Review of the available imaging was performed at this time. The intermediate catheter was removed and Neuro Max catheter were removed. Diagnostic imaging was performed at the right vert origin. Angiogram of the left carotid system was also performed including intracranial images. Catheter was removed, and the bright tip sheath was exchanged for a short 8 French sheath at the right common femoral artery. Control angiogram was performed at the right common femoral artery puncture site. After the angiogram, right common femoral sheath was removed with deployment of Exoseal device. Patient tolerated the procedure well and remained hemodynamically stable throughout. No complications were encountered. Estimated blood loss approximately 50 cc. FINDINGS: Baseline angiogram Right common carotid artery:  Normal course caliber and contour. Right external carotid artery: Patent with antegrade flow. Significant flow through the superficial temporal branch, potentially contributing to collateral flow intracranial. Right internal carotid artery: Normal course caliber and contour of the cervical portion. Vertical and petrous segment patent with normal course caliber contour. Cavernous segment patent. Clinoid segment patent. Antegrade flow of the ophthalmic artery. Ophthalmic segment patent. Terminus patent. Right MCA: Proximal occlusion of the middle cerebral artery. There is excellent collateral flow to the right hemisphere VA anterior cerebral artery. Right ACA: A 1 segment patent. A 2 segment perfuses the right territory. Baseline perfusion TICI 1, with excellent collateral perfusion. After the 1st pass, there is no significant improvement through the occlusion, with persistent TICI 1. After the 2nd pass, there is very minimal improvement in flow through the occluded segment, with persisting TICI 1 flow,  and excellent collateral flow maintained. Vertebral origin injection demonstrates normal course contour and caliber of the right vertebral artery, with patent V3 segment. Cross flow from the left vertebral artery with partial washout in the basilar artery. Patent posterior inferior cerebellar artery, anterior  inferior cerebellar artery, and superior cerebral artery. Left carotid angiogram demonstrates normal course caliber and contour of the left CCA and cervical ICA. Patent external carotid artery. Suggestion of mild atherosclerotic changes at the origin of the A1 segment, with patent 82 segment. Patent middle cerebral artery with typical appearance of the arterial, late arterial, capillary and venous phase. IMPRESSION: Status post cerebral angiogram with attempted mechanical thrombectomy of right MCA occlusion. After 2 passes with local aspiration and stentreiver technology, there is persistent occlusion and TICI 1 flow. Excellent collateral flow perfusing the right MCA territory. My impression is that there is likely underlying stenosis at the site of occlusion given the collateral flow, and that further manipulation may cause dissection or hemorrhage. Given the excellent collateral flow and absence of core infarction on the perfusion imaging, we elected to defer further attempts at thrombectomy. Deployment of Exoseal for hemostasis. Signed, Yvone Neu. Loreta Ave, DO [facility] Electronically Signed   By: [name] D.O.   On: 03/11/2016 13:10   Ct Cerebral Perfusion W Contrast  Result Date: 03/11/2016 CLINICAL DATA:  50 year old male with right MCA M1 occlusion on CTA and only small lacunar type infarct in the right lentiform evident by CT since [birth year]/1938 4 hours today. Initial encounter. EXAM: CT PERFUSION BRAIN TECHNIQUE: Multiphase CT imaging of the brain was performed following IV bolus contrast injection. Subsequent parametric perfusion maps were  calculated using RAPID software. CONTRAST:  50 mL Isovue 370 COMPARISON:  CTA head and neck performed at 0640 hours today. Head CTs earlier today. FINDINGS: CT Brain Perfusion Findings: CBF (<30%) Volume:  0 mL Perfusion (Tmax>6.0s) volume: 118 mL mL, corresponding to the right MCA territory Mismatch Volume:  " Infinite" Infarction Location:Noncontrast head CT suggests there is a small core infarct in the right lentiform nuclei on this patient, but this is not detected using the perfusion thresholds for this exam. IMPRESSION: Large area of right MCA territory penumbra. Clinically suspected small volume core infarct in the right lentiform not detected. Overall very favorable CTP characteristics for endovascular reperfusion. I am advised that they are prepping for Neurointervention at the time of this dictation. Electronically Signed   By: Odessa Fleming M.D.   On: 03/11/2016 09:27   Portable Chest Xray  Result Date: 03/12/2016 CLINICAL DATA:  Respiratory failure.  Stroke. EXAM: PORTABLE CHEST 1 VIEW COMPARISON:  03/11/2016. FINDINGS: Interim extubation. Mild narrowing of the upper trachea noted. Trachea stenosis or paratracheal process cannot be excluded. Mediastinum hilar structures normal. Cardiomegaly. Mild pulmonary venous congestion. No focal infiltrate. No pleural effusion or pneumothorax. No acute bony abnormality . IMPRESSION: 1. Interim extubation. Mild narrowing of the upper trachea noted. Tracheal stenosis or peritracheal process cannot be excluded . 2. Cardiomegaly with mild pulmonary venous congestion. No overt pulmonary edema. Electronically Signed   By: Maisie Fus  Register   On: 03/12/2016 07:25   Portable Chest Xray  Result Date: 03/11/2016 CLINICAL DATA:  Hypoxia EXAM: PORTABLE CHEST 1 VIEW COMPARISON:  None. FINDINGS: Endotracheal tube tip is 2.9 cm above the carina. No pneumothorax. There is no edema or consolidation. Heart size and pulmonary vascularity are normal. No adenopathy. No bone lesions.  IMPRESSION: Endotracheal tube as described without pneumothorax. No edema or consolidation. Electronically Signed   By: Bretta Bang III M.D.   On: 03/11/2016 15:56   Mr Maxine Glenn Head/brain WU Cm  Result Date: 03/12/2016 CLINICAL DATA:  Known RIGHT M1 occlusion, follow-up stroke. EXAM: MRI HEAD WITHOUT CONTRAST MRA HEAD WITHOUT CONTRAST  TECHNIQUE: Multiplanar, multiecho pulse sequences of the brain and surrounding structures were obtained without intravenous contrast. Angiographic images of the head were obtained using MRA technique without contrast. Patient was unable to tolerate further imaging, coronal T2 not seen. COMPARISON:  CT HEAD March 11, 2016 FINDINGS: MRI HEAD FINDINGS- mildly motion degraded examination. BRAIN: Patchy reduced diffusion RIGHT basal ganglia, RIGHT frontotemporal parietal lobes with corresponding low ADC values faint clear T2 hyperintense signal. No susceptibility artifact to suggest hemorrhage. No susceptibility artifact to suggest hemorrhage. The ventricles and sulci are normal for patient's age. No suspicious parenchymal signal, masses or mass effect. No abnormal extra-axial fluid collections. VASCULAR: Normal major intracranial vascular flow voids present at skull base. SKULL AND UPPER CERVICAL SPINE: No abnormal sellar expansion. No suspicious calvarial bone marrow signal. Craniocervical junction maintained. SINUSES/ORBITS: The mastoid air-cells and included paranasal sinuses are well-aerated. The included ocular globes and orbital contents are non-suspicious. OTHER: None. MRA HEAD FINDINGS- moderately motion degraded ANTERIOR CIRCULATION: Flow related enhancement bilateral internal carotid arteries, apparent mild narrowing RIGHT cavernous internal carotid artery, the vessel is patent on prior CT and this is attributable to calcific atherosclerosis. Chronically occluded proximal RIGHT M1 segments, no collateral vessels identified on time-of-flight MRA. LEFT middle cerebral  artery and bilateral anterior cerebral arteries are patent. POSTERIOR CIRCULATION: Bilateral vertebral arteries, basilar artery are patent. Limited assessment of the main branch vessels due to patient motion. Patent bilateral posterior cerebral arteries. Small RIGHT posterior communicating artery present as seen on prior CTA. ANATOMIC VARIANTS: None. 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 No hemorrhage. MRA HEAD: Moderately motion degraded examination. RIGHT M1 occlusion, no collateral vessels by time-of-flight technique. Electronically Signed   By: Awilda Metro M.D.   On: 03/12/2016 05:07   Ir Percutaneous Art Thrombectomy/infusion Intracranial Inc Diag Angio  Result Date: 03/11/2016 INDICATION: 51 year old male presenting with acute left-sided symptoms and imaging evidence of right MCA emergent large vessel occlusion. CT imaging demonstrates normal aspects score, with CT perfusion shadowing elevated mean transit time with potential tissue at risk and no core infarct. EXAM: IR PERCUTANEOUS ART THORMBECTOMY/INFUSION INTRACRANIAL INCLUDE DIAG ANGIO; IR ANGIO VERTEBRAL SEL VERTEBRAL UNI RIGHT MOD SED; IR ANGIO INTRA EXTRACRAN SEL COM CAROTID INNOMINATE UNI LEFT MOD SED; IR ULTRASOUND GUIDANCE VASC ACCESS RIGHT COMPARISON:  No prior CT angiogram MEDICATIONS: 2.0 g Ancef. The antibiotic was administered within 1 hour of the procedure ANESTHESIA/SEDATION: Anesthesia team provided general anesthesia CONTRAST:  160 cc Isovue-300 FLUOROSCOPY TIME:  Fluoroscopy Time: 24 minutes 36 seconds (533 mGy). COMPLICATIONS: None TECHNIQUE: Informed written consent was obtained from the patient and the patient's family after a thorough discussion of the procedural risks, benefits and alternatives. Specific risks discussed include: Bleeding, infection, contrast reaction, kidney injury/failure, need for further procedure/surgery, arterial injury or dissection, embolization to new territory,  intracranial hemorrhage (10-15% risk), neurologic deterioration, cardiopulmonary collapse, death. All questions were addressed. Maximal Sterile Barrier Technique was utilized including during the procedure including caps, mask, sterile gowns, sterile gloves, sterile drape, hand hygiene and skin antiseptic. A timeout was performed prior to the initiation of the procedure. Ultrasound survey of the right inguinal region was performed with images stored and sent to PACs. 11 blade scalpel was used to make a small incision. A micropuncture needle was used access the right common femoral artery under ultrasound. With excellent arterial blood flow returned, an .018 micro wire was passed through the needle, observed to enter the abdominal aorta under fluoroscopy. The needle was removed, and a micropuncture  sheath was placed over the wire. The inner dilator and wire were removed, and an 035 Bentson wire was advanced under fluoroscopy into the abdominal aorta. The sheath was removed and a standard 5 [place] vascular sheath was placed. The dilator was removed and the sheath was flushed. A 65F JB-1 diagnostic catheter was advanced over the wire to the proximal descending thoracic aorta. Wire was then removed. Double flush of the catheter was performed. Catheter was then used to select the innominate artery. Angiogram was performed. Using roadmap technique, the catheter was advanced over a roadrunner wire into cervical ICA. Formal angiogram was performed. Exchange length Rosen wire was then passed through the diagnostic catheter to the distal cervical ICA and the diagnostic catheter was removed. The 5 French sheath was removed and exchanged for 8 French 55 centimeter BrightTip sheath. Sheath was flushed and attached to pressurized and heparinized saline bag for constant forward flow. Then a neuron Max 85cm catheter was prepared on the back table. Ace 68 intermediate catheter was then loaded though the Neuron Max catheter and  advanced over the [facility] wire to the internal carotid artery. Wire was removed, and roadmap angiogram was performed. Microcatheter system was then introduced through the Ace catheter using a synchro soft 014 wire and a Trevo Provue18 catheter. Microcatheter system was advanced into the internal carotid artery, to the level of the occlusion. The micro wire was then carefully advanced through the occluded segment. Microcatheter was then push through the occluded segment and the wire was removed. Ace catheter was advanced over the micro wire to the ophthalmic ICA segment. Blood was then aspirated through the hub of the microcatheter, and a gentle contrast injection was performed confirming intraluminal position. A rotating hemostatic valve was then attached to the back end of the microcatheter, and a pressurized and heparinized saline bag was attached to the catheter. 4 x 40 solitaire device was then selected. Back flush was achieved at the rotating hemostatic valve, and then the device was gently advanced through the microcatheter to the distal end. The retriever was then unsheathed by withdrawing the microcatheter under fluoroscopy. Once the retriever was completely unsheathed, control angiogram was performed from the balloon catheter. Constant aspiration was then performed through the Ace catheter as the retriever was gently and slowly withdrawn with fluoroscopic observation. Once the retriever was entirely removed from the system, free aspiration was confirmed at the hub of the intermediate catheter, with free blood return confirmed. Control angiogram was then performed. Persistent occlusion at the proximal MCA was confirmed. The microcatheter system was then advanced through the Ace catheter. On the 2nd attempt, a lateral view was required in order to guide the microcatheter system beyond the ophthalmic artery origin. Once the micro wire microcatheter were beyond the occlusion, the micro wire was removed and  solitaire 4 x 40 device was deployed. After the solitaire was deployed across the occlusion, the Ace catheter was advanced to the M1 segment at the site of the occlusion. Local aspiration was performed upon withdrawal of the solitaire device under fluoroscopic observation. Once the retrieve her was entirely removed from the system, free aspiration was confirmed at the hub of the intermediate catheter, with free blood return confirmed. Control angiogram was again performed. Review of the available imaging was performed at this time. The intermediate catheter was removed and Neuro Max catheter were removed. Diagnostic imaging was performed at the right vert origin. Angiogram of the left carotid system was also performed including intracranial images. Catheter was removed, and  the bright tip sheath was exchanged for a short 8 French sheath at the right common femoral artery. Control angiogram was performed at the right common femoral artery puncture site. After the angiogram, right common femoral sheath was removed with deployment of Exoseal device. Patient tolerated the procedure well and remained hemodynamically stable throughout. No complications were encountered. Estimated blood loss approximately 50 cc. FINDINGS: Baseline angiogram Right common carotid artery:  Normal course caliber and contour. Right external carotid artery: Patent with antegrade flow. Significant flow through the superficial temporal branch, potentially contributing to collateral flow intracranial. Right internal carotid artery: Normal course caliber and contour of the cervical portion. Vertical and petrous segment patent with normal course caliber contour. Cavernous segment patent. Clinoid segment patent. Antegrade flow of the ophthalmic artery. Ophthalmic segment patent. Terminus patent. Right MCA: Proximal occlusion of the middle cerebral artery. There is excellent collateral flow to the right hemisphere VA anterior cerebral artery. Right  ACA: A 1 segment patent. A 2 segment perfuses the right territory. Baseline perfusion TICI 1, with excellent collateral perfusion. After the 1st pass, there is no significant improvement through the occlusion, with persistent TICI 1. After the 2nd pass, there is very minimal improvement in flow through the occluded segment, with persisting TICI 1 flow, and excellent collateral flow maintained. Vertebral origin injection demonstrates normal course contour and caliber of the right vertebral artery, with patent V3 segment. Cross flow from the left vertebral artery with partial washout in the basilar artery. Patent posterior inferior cerebellar artery, anterior inferior cerebellar artery, and superior cerebral artery. Left carotid angiogram demonstrates normal course caliber and contour of the left CCA and cervical ICA. Patent external carotid artery. Suggestion of mild atherosclerotic changes at the origin of the A1 segment, with patent 82 segment. Patent middle cerebral artery with typical appearance of the arterial, late arterial, capillary and venous phase. IMPRESSION: Status post cerebral angiogram with attempted mechanical thrombectomy of right MCA occlusion. After 2 passes with local aspiration and stentreiver technology, there is persistent occlusion and TICI 1 flow. Excellent collateral flow perfusing the right MCA territory. My impression is that there is likely underlying stenosis at the site of occlusion given the collateral flow, and that further manipulation may cause dissection or hemorrhage. Given the excellent collateral flow and absence of core infarction on the perfusion imaging, we elected to defer further attempts at thrombectomy. Deployment of Exoseal for hemostasis. Signed, Yvone Neu. Loreta Ave, DO [facility] Radiology Electronically Signed   By: [name] D.O.   On: 03/11/2016 13:10   Ct Head Code Stroke Wo Contrast`  Result Date:  03/11/2016 CLINICAL DATA:  Code stroke. 51 year old male with right MCA M1 occlusion on CTA head and neck for evaluation of 2 days of left side weakness. Initial encounter. EXAM: CT HEAD WITHOUT CONTRAST TECHNIQUE: Contiguous axial images were obtained from the base of the skull through the vertex without intravenous contrast. COMPARISON:  CTA 0640 hours today. Head CT without contrast 0544 hours today. FINDINGS: Brain: There is stable hypodensity in the right lentiform nuclei, but otherwise no cytotoxic edema is evident in the right MCA territory. Gray-white matter differentiation appears stable No acute intracranial hemorrhage identified. No midline shift, mass effect, or evidence of intracranial mass lesion. No ventriculomegaly. Vascular: Mild residual intravascular contrast. Skull: Previous right frontotemporal craniotomy. No acute osseous abnormality identified. Sinuses/Orbits: Stable. Well pneumatized in general. Chronic left lamina papyracea fracture. Other: No acute orbit or scalp soft tissue findings. ASPECTS [facility] Stroke Program  Early CT Score) - Ganglionic level infarction (caudate, lentiform nuclei, internal capsule, insula, M1-M3 cortex): 6 (minus 1 for L) - Supraganglionic infarction (M4-M6 cortex): 3 Total score (0-10 with 10 being normal): 9 IMPRESSION: 1. Stable noncontrast CT appearance of the brain since 0544 hours today: Hypodensity in the right lentiform nucleus, but no other CT changes of right MCA infarct. No acute intracranial hemorrhage. 2. ASPECTS is 9. 3. I was advised that Dr. Ruthy Dick was discussing the findings on this study with Neurology at 0916 hours. Electronically Signed   By: Odessa Fleming M.D.   On: 03/11/2016 09:22   Ir Angio Intra Extracran Sel Com Carotid Innominate Uni L Mod Sed  Result Date: 03/11/2016 INDICATION: 51 year old male presenting with acute left-sided symptoms and imaging evidence of right MCA emergent large vessel occlusion. CT imaging demonstrates normal  aspects score, with CT perfusion shadowing elevated mean transit time with potential tissue at risk and no core infarct. EXAM: IR PERCUTANEOUS ART THORMBECTOMY/INFUSION INTRACRANIAL INCLUDE DIAG ANGIO; IR ANGIO VERTEBRAL SEL VERTEBRAL UNI RIGHT MOD SED; IR ANGIO INTRA EXTRACRAN SEL COM CAROTID INNOMINATE UNI LEFT MOD SED; IR ULTRASOUND GUIDANCE VASC ACCESS RIGHT COMPARISON:  No prior CT angiogram MEDICATIONS: 2.0 g Ancef. The antibiotic was administered within 1 hour of the procedure ANESTHESIA/SEDATION: Anesthesia team provided general anesthesia CONTRAST:  160 cc Isovue-300 FLUOROSCOPY TIME:  Fluoroscopy Time: 24 minutes 36 seconds (533 mGy). COMPLICATIONS: None TECHNIQUE: Informed written consent was obtained from the patient and the patient's family after a thorough discussion of the procedural risks, benefits and alternatives. Specific risks discussed include: Bleeding, infection, contrast reaction, kidney injury/failure, need for further procedure/surgery, arterial injury or dissection, embolization to new territory, intracranial hemorrhage (10-15% risk), neurologic deterioration, cardiopulmonary collapse, death. All questions were addressed. Maximal Sterile Barrier Technique was utilized including during the procedure including caps, mask, sterile gowns, sterile gloves, sterile drape, hand hygiene and skin antiseptic. A timeout was performed prior to the initiation of the procedure. Ultrasound survey of the right inguinal region was performed with images stored and sent to PACs. 11 blade scalpel was used to make a small incision. A micropuncture needle was used access the right common femoral artery under ultrasound. With excellent arterial blood flow returned, an .018 micro wire was passed through the needle, observed to enter the abdominal aorta under fluoroscopy. The needle was removed, and a micropuncture sheath was placed over the wire. The inner dilator and wire were removed, and an 035 Bentson wire was  advanced under fluoroscopy into the abdominal aorta. The sheath was removed and a standard 5 [place] vascular sheath was placed. The dilator was removed and the sheath was flushed. A 60F JB-1 diagnostic catheter was advanced over the wire to the proximal descending thoracic aorta. Wire was then removed. Double flush of the catheter was performed. Catheter was then used to select the innominate artery. Angiogram was performed. Using roadmap technique, the catheter was advanced over a roadrunner wire into cervical ICA. Formal angiogram was performed. Exchange length Rosen wire was then passed through the diagnostic catheter to the distal cervical ICA and the diagnostic catheter was removed. The 5 French sheath was removed and exchanged for 8 French 55 centimeter BrightTip sheath. Sheath was flushed and attached to pressurized and heparinized saline bag for constant forward flow. Then a neuron Max 85cm catheter was prepared on the back table. Ace 68 intermediate catheter was then loaded though the Neuron Max catheter and advanced over the [facility] wire to the internal carotid artery.  Wire was removed, and roadmap angiogram was performed. Microcatheter system was then introduced through the Ace catheter using a synchro soft 014 wire and a Trevo Provue18 catheter. Microcatheter system was advanced into the internal carotid artery, to the level of the occlusion. The micro wire was then carefully advanced through the occluded segment. Microcatheter was then push through the occluded segment and the wire was removed. Ace catheter was advanced over the micro wire to the ophthalmic ICA segment. Blood was then aspirated through the hub of the microcatheter, and a gentle contrast injection was performed confirming intraluminal position. A rotating hemostatic valve was then attached to the back end of the microcatheter, and a pressurized and heparinized saline bag was attached to the catheter. 4 x 40 solitaire device was then  selected. Back flush was achieved at the rotating hemostatic valve, and then the device was gently advanced through the microcatheter to the distal end. The retriever was then unsheathed by withdrawing the microcatheter under fluoroscopy. Once the retriever was completely unsheathed, control angiogram was performed from the balloon catheter. Constant aspiration was then performed through the Ace catheter as the retriever was gently and slowly withdrawn with fluoroscopic observation. Once the retriever was entirely removed from the system, free aspiration was confirmed at the hub of the intermediate catheter, with free blood return confirmed. Control angiogram was then performed. Persistent occlusion at the proximal MCA was confirmed. The microcatheter system was then advanced through the Ace catheter. On the 2nd attempt, a lateral view was required in order to guide the microcatheter system beyond the ophthalmic artery origin. Once the micro wire microcatheter were beyond the occlusion, the micro wire was removed and solitaire 4 x 40 device was deployed. After the solitaire was deployed across the occlusion, the Ace catheter was advanced to the M1 segment at the site of the occlusion. Local aspiration was performed upon withdrawal of the solitaire device under fluoroscopic observation. Once the retrieve her was entirely removed from the system, free aspiration was confirmed at the hub of the intermediate catheter, with free blood return confirmed. Control angiogram was again performed. Review of the available imaging was performed at this time. The intermediate catheter was removed and Neuro Max catheter were removed. Diagnostic imaging was performed at the right vert origin. Angiogram of the left carotid system was also performed including intracranial images. Catheter was removed, and the bright tip sheath was exchanged for a short 8 French sheath at the right common femoral artery. Control angiogram was performed  at the right common femoral artery puncture site. After the angiogram, right common femoral sheath was removed with deployment of Exoseal device. Patient tolerated the procedure well and remained hemodynamically stable throughout. No complications were encountered. Estimated blood loss approximately 50 cc. FINDINGS: Baseline angiogram Right common carotid artery:  Normal course caliber and contour. Right external carotid artery: Patent with antegrade flow. Significant flow through the superficial temporal branch, potentially contributing to collateral flow intracranial. Right internal carotid artery: Normal course caliber and contour of the cervical portion. Vertical and petrous segment patent with normal course caliber contour. Cavernous segment patent. Clinoid segment patent. Antegrade flow of the ophthalmic artery. Ophthalmic segment patent. Terminus patent. Right MCA: Proximal occlusion of the middle cerebral artery. There is excellent collateral flow to the right hemisphere VA anterior cerebral artery. Right ACA: A 1 segment patent. A 2 segment perfuses the right territory. Baseline perfusion TICI 1, with excellent collateral perfusion. After the 1st pass, there is no significant improvement through the  occlusion, with persistent TICI 1. After the 2nd pass, there is very minimal improvement in flow through the occluded segment, with persisting TICI 1 flow, and excellent collateral flow maintained. Vertebral origin injection demonstrates normal course contour and caliber of the right vertebral artery, with patent V3 segment. Cross flow from the left vertebral artery with partial washout in the basilar artery. Patent posterior inferior cerebellar artery, anterior inferior cerebellar artery, and superior cerebral artery. Left carotid angiogram demonstrates normal course caliber and contour of the left CCA and cervical ICA. Patent external carotid artery. Suggestion of mild atherosclerotic changes at the origin of  the A1 segment, with patent 82 segment. Patent middle cerebral artery with typical appearance of the arterial, late arterial, capillary and venous phase. IMPRESSION: Status post cerebral angiogram with attempted mechanical thrombectomy of right MCA occlusion. After 2 passes with local aspiration and stentreiver technology, there is persistent occlusion and TICI 1 flow. Excellent collateral flow perfusing the right MCA territory. My impression is that there is likely underlying stenosis at the site of occlusion given the collateral flow, and that further manipulation may cause dissection or hemorrhage. Given the excellent collateral flow and absence of core infarction on the perfusion imaging, we elected to defer further attempts at thrombectomy. Deployment of Exoseal for hemostasis. Signed, Yvone Neu. Loreta Ave, DO [facility] Radiology Electronically Signed   By: [name] D.O.   On: 03/11/2016 13:10   Ir Angio Vertebral Sel Vertebral Uni R Mod Sed  Result Date: 03/11/2016 INDICATION: 51 year old male presenting with acute left-sided symptoms and imaging evidence of right MCA emergent large vessel occlusion. CT imaging demonstrates normal aspects score, with CT perfusion shadowing elevated mean transit time with potential tissue at risk and no core infarct. EXAM: IR PERCUTANEOUS ART THORMBECTOMY/INFUSION INTRACRANIAL INCLUDE DIAG ANGIO; IR ANGIO VERTEBRAL SEL VERTEBRAL UNI RIGHT MOD SED; IR ANGIO INTRA EXTRACRAN SEL COM CAROTID INNOMINATE UNI LEFT MOD SED; IR ULTRASOUND GUIDANCE VASC ACCESS RIGHT COMPARISON:  No prior CT angiogram MEDICATIONS: 2.0 g Ancef. The antibiotic was administered within 1 hour of the procedure ANESTHESIA/SEDATION: Anesthesia team provided general anesthesia CONTRAST:  160 cc Isovue-300 FLUOROSCOPY TIME:  Fluoroscopy Time: 24 minutes 36 seconds (533 mGy). COMPLICATIONS: None TECHNIQUE: Informed written consent was obtained from the patient  and the patient's family after a thorough discussion of the procedural risks, benefits and alternatives. Specific risks discussed include: Bleeding, infection, contrast reaction, kidney injury/failure, need for further procedure/surgery, arterial injury or dissection, embolization to new territory, intracranial hemorrhage (10-15% risk), neurologic deterioration, cardiopulmonary collapse, death. All questions were addressed. Maximal Sterile Barrier Technique was utilized including during the procedure including caps, mask, sterile gowns, sterile gloves, sterile drape, hand hygiene and skin antiseptic. A timeout was performed prior to the initiation of the procedure. Ultrasound survey of the right inguinal region was performed with images stored and sent to PACs. 11 blade scalpel was used to make a small incision. A micropuncture needle was used access the right common femoral artery under ultrasound. With excellent arterial blood flow returned, an .018 micro wire was passed through the needle, observed to enter the abdominal aorta under fluoroscopy. The needle was removed, and a micropuncture sheath was placed over the wire. The inner dilator and wire were removed, and an 035 Bentson wire was advanced under fluoroscopy into the abdominal aorta. The sheath was removed and a standard 5 [place] vascular sheath was placed. The dilator was removed and the sheath was flushed. A 66F JB-1 diagnostic catheter was  advanced over the wire to the proximal descending thoracic aorta. Wire was then removed. Double flush of the catheter was performed. Catheter was then used to select the innominate artery. Angiogram was performed. Using roadmap technique, the catheter was advanced over a roadrunner wire into cervical ICA. Formal angiogram was performed. Exchange length Rosen wire was then passed through the diagnostic catheter to the distal cervical ICA and the diagnostic catheter was removed. The 5 French sheath was removed and  exchanged for 8 French 55 centimeter BrightTip sheath. Sheath was flushed and attached to pressurized and heparinized saline bag for constant forward flow. Then a neuron Max 85cm catheter was prepared on the back table. Ace 68 intermediate catheter was then loaded though the Neuron Max catheter and advanced over the [facility] wire to the internal carotid artery. Wire was removed, and roadmap angiogram was performed. Microcatheter system was then introduced through the Ace catheter using a synchro soft 014 wire and a Trevo Provue18 catheter. Microcatheter system was advanced into the internal carotid artery, to the level of the occlusion. The micro wire was then carefully advanced through the occluded segment. Microcatheter was then push through the occluded segment and the wire was removed. Ace catheter was advanced over the micro wire to the ophthalmic ICA segment. Blood was then aspirated through the hub of the microcatheter, and a gentle contrast injection was performed confirming intraluminal position. A rotating hemostatic valve was then attached to the back end of the microcatheter, and a pressurized and heparinized saline bag was attached to the catheter. 4 x 40 solitaire device was then selected. Back flush was achieved at the rotating hemostatic valve, and then the device was gently advanced through the microcatheter to the distal end. The retriever was then unsheathed by withdrawing the microcatheter under fluoroscopy. Once the retriever was completely unsheathed, control angiogram was performed from the balloon catheter. Constant aspiration was then performed through the Ace catheter as the retriever was gently and slowly withdrawn with fluoroscopic observation. Once the retriever was entirely removed from the system, free aspiration was confirmed at the hub of the intermediate catheter, with free blood return confirmed. Control angiogram was then performed. Persistent occlusion at the proximal MCA was  confirmed. The microcatheter system was then advanced through the Ace catheter. On the 2nd attempt, a lateral view was required in order to guide the microcatheter system beyond the ophthalmic artery origin. Once the micro wire microcatheter were beyond the occlusion, the micro wire was removed and solitaire 4 x 40 device was deployed. After the solitaire was deployed across the occlusion, the Ace catheter was advanced to the M1 segment at the site of the occlusion. Local aspiration was performed upon withdrawal of the solitaire device under fluoroscopic observation. Once the retrieve her was entirely removed from the system, free aspiration was confirmed at the hub of the intermediate catheter, with free blood return confirmed. Control angiogram was again performed. Review of the available imaging was performed at this time. The intermediate catheter was removed and Neuro Max catheter were removed. Diagnostic imaging was performed at the right vert origin. Angiogram of the left carotid system was also performed including intracranial images. Catheter was removed, and the bright tip sheath was exchanged for a short 8 French sheath at the right common femoral artery. Control angiogram was performed at the right common femoral artery puncture site. After the angiogram, right common femoral sheath was removed with deployment of Exoseal device. Patient tolerated the procedure well and remained hemodynamically stable throughout.  No complications were encountered. Estimated blood loss approximately 50 cc. FINDINGS: Baseline angiogram Right common carotid artery:  Normal course caliber and contour. Right external carotid artery: Patent with antegrade flow. Significant flow through the superficial temporal branch, potentially contributing to collateral flow intracranial. Right internal carotid artery: Normal course caliber and contour of the cervical portion. Vertical and petrous segment patent with normal course caliber  contour. Cavernous segment patent. Clinoid segment patent. Antegrade flow of the ophthalmic artery. Ophthalmic segment patent. Terminus patent. Right MCA: Proximal occlusion of the middle cerebral artery. There is excellent collateral flow to the right hemisphere VA anterior cerebral artery. Right ACA: A 1 segment patent. A 2 segment perfuses the right territory. Baseline perfusion TICI 1, with excellent collateral perfusion. After the 1st pass, there is no significant improvement through the occlusion, with persistent TICI 1. After the 2nd pass, there is very minimal improvement in flow through the occluded segment, with persisting TICI 1 flow, and excellent collateral flow maintained. Vertebral origin injection demonstrates normal course contour and caliber of the right vertebral artery, with patent V3 segment. Cross flow from the left vertebral artery with partial washout in the basilar artery. Patent posterior inferior cerebellar artery, anterior inferior cerebellar artery, and superior cerebral artery. Left carotid angiogram demonstrates normal course caliber and contour of the left CCA and cervical ICA. Patent external carotid artery. Suggestion of mild atherosclerotic changes at the origin of the A1 segment, with patent 82 segment. Patent middle cerebral artery with typical appearance of the arterial, late arterial, capillary and venous phase. IMPRESSION: Status post cerebral angiogram with attempted mechanical thrombectomy of right MCA occlusion. After 2 passes with local aspiration and stentreiver technology, there is persistent occlusion and TICI 1 flow. Excellent collateral flow perfusing the right MCA territory. My impression is that there is likely underlying stenosis at the site of occlusion given the collateral flow, and that further manipulation may cause dissection or hemorrhage. Given the excellent collateral flow and absence of core infarction on the perfusion imaging, we elected to defer further  attempts at thrombectomy. Deployment of Exoseal for hemostasis. Signed, Yvone Neu. Loreta Ave, DO [facility] Radiology Electronically Signed   By: [name] D.O.   On: 03/11/2016 13:10    Labs:  CBC:  Recent Labs  03/11/16 0920 03/12/16 0500 03/13/16 0354  WBC 10.5 17.5* 10.1  HGB 14.8 12.8* 13.3  HCT 44.1 38.7* 40.2  PLT 145* 146* 123*    COAGS:  Recent Labs  03/11/16 0920  INR 0.98    BMP:  Recent Labs  03/11/16 0920 03/11/16 1623 03/12/16 0500 03/13/16 0354  NA 138 141 139 139  K 3.8 3.8 3.6 3.6  CL 104 109 105 105  CO2 24 23 26 26   GLUCOSE 120* 151* 109* 110*  BUN 10 9 12 11   CALCIUM 9.2 8.8* 8.9 9.0  CREATININE 0.64 0.67 0.67 0.67  GFRNONAA >60 >60 >60 >60  GFRAA >60 >60 >60 >60    LIVER FUNCTION TESTS: No results for input(s): BILITOT, AST, ALT, ALKPHOS, PROT, ALBUMIN in the last 8760 hours.  Assessment and Plan:  CVA Attempted revascularization R MCA 2/14 Collateral filling Plans per Neuro  Electronically Signed: Kailer Heindel A 03/13/2016, 10:30 AM   I spent a total of 15 Minutes at the the patient's bedside AND on the patient's hospital floor or unit, greater than 50% of which was counseling/coordinating care for CVA

## 2016-03-13 NOTE — Progress Notes (Signed)
Speech Language Pathology Treatment: Dysphagia  Patient Details Name: Grant Garcia MRN: [id number] DOB: 05-Jul-1965 Today's Date: 03/13/2016 Time: 1110-1130 SLP Time Calculation (min) (ACUTE ONLY): 20 min  Assessment / Plan / Recommendation Clinical Impression  Pt seen for check of tolerance with current diet. Left facial nerve weakness and sensory loss significantly impact labial seal resulting in drooling and anterior spillage with cup sips without pt awareness. Straw beneficial, but oral residuals also spill eventually and did result in a delayed cough x1 during session, indicating adequate sensation of aspirate, which is encouraging. Cannot upgrade diet at this time due to severe left buccal residuals. Advised pt to be more conscious of wiping mouth clearing left side of mouth regularly which required moderate cues. Discussed appropriate textures wife can bring from home for pt to try. Recommend CIR at d/c.    HPI HPI: [name], current smoker (1.5 - 2ppd x 30 years) with no PMH who [facility] with worsening left sided weakness. CT of the head that was concerning for emergent large vessel occlusion of the right MCA. MRI pending. He is s/p attempted thrombectomy but with persistent occlusion. He was intubated 2/14 for procedure only.       SLP Plan  Continue with current plan of care     Recommendations  Diet recommendations: Thin liquid;Dysphagia 1 (puree) Liquids provided via: Straw Medication Administration: Whole meds with puree Supervision: Full supervision/cueing for compensatory strategies Compensations: Slow rate;Small sips/bites;Monitor for anterior loss;Lingual sweep for clearance of pocketing Postural Changes and/or Swallow Maneuvers: Seated upright 90 degrees                Oral Care Recommendations: Oral care BID Follow up Recommendations: Inpatient Rehab Plan: Continue with current plan of care       GO               [facility] Surgery CtrBonnie Zorianna Taliaferro, MA CCC-SLP [phone]  Claudine MoutonDeBlois, Akash Winski Caroline 03/13/2016, 2:50 PM

## 2016-03-13 NOTE — Progress Notes (Signed)
Grant Garcia is a 51 y.o. male Transfer  from [place]M oriented  X 3- no acute distress noted.  VSS - Blood pressure 132/71, pulse (!) 49, temperature 98.5 F (36.9 C), temperature source Oral, resp. rate 16, height 5\' 6"  (1.676 m), weight 73.6 kg (162 lb 4.1 oz), SpO2 96 %.    IV in place, occlusive dsg intact without redness.  Orientation to room, and floor completed Will cont to eval and treat per MD orders.  Eligah EastErin M Kristine Tiley, RN 03/13/2016 2:07 PM

## 2016-03-13 NOTE — Progress Notes (Signed)
Occupational Therapy Treatment Patient Details Name: Grant Garcia MRN: [id number] DOB: 1965/08/20 Today's Date: 03/13/2016    History of present illness Pt is 51 y.o. male, current smoker with no PMH who transferred to [facility] on 03/11/16 from [facility] ER with worsening left sided weakness. 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 Thrombectomy attempted inNeuro IR2/14/18 without further manipulation due to risk for dissection.  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  He required minA to EOB sitting.  Worked on facilitation of functional reach with Lt. UE.  Recommend CIR.   Follow Up Recommendations  CIR;Supervision/Assistance - 24 hour    Equipment Recommendations  3 in 1 bedside commode;Tub/shower bench    Recommendations for Other Services Rehab consult    Precautions / Restrictions Precautions Precautions: Fall Restrictions Weight Bearing Restrictions: No       Mobility Bed Mobility Overal bed mobility: Needs Assistance Bed Mobility: Supine to Sit     Supine to sit: Mod assist     General bed mobility comments: faciliation for use of Lt UE and Lt LE.  assist to lift trunk   Transfers Overall transfer level: Needs assistance Equipment used: 1 person hand held assist Transfers: Sit to/from [employer] Transfers Sit to Stand: Mod assist Stand pivot transfers: Mod assist       General transfer comment: facilitation for anterior translation of trunk and for hip extension and to advance Lt LE     Balance Overall balance assessment: Needs assistance Sitting-balance support: Feet supported Sitting balance-Leahy Scale: Poor Sitting balance - Comments: Loses balance to the Lt,  but pt able to correct today with min A and min verbal cues  Postural control: Left lateral lean Standing balance support: Single extremity supported Standing balance-Leahy Scale: Poor Standing balance comment: mod A to maintain standing                    ADL Overall ADL's : Needs assistance/impaired                         Toilet Transfer: Moderate assistance;Stand-pivot;BSC   Toileting- Clothing Manipulation and Hygiene: Maximal assistance;Sit to/from stand       Functional mobility during ADLs: Maximal assistance General ADL Comments: Yvette Rack for interpreting       Vision                     Perception     Praxis      Cognition   Behavior During Therapy: Flat affect Overall Cognitive Status: Impaired/Different from baseline     Current Attention Level: Sustained    Following Commands: Follows multi-step commands inconsistently Safety/Judgement: Decreased awareness of safety;Decreased awareness of deficits   Problem Solving: Slow processing;Difficulty sequencing;Requires verbal cues;Requires tactile cues      Extremity/Trunk Assessment               Exercises Other Exercises Other Exercises: worked on facilitation of reach with Lt UE for reach - able to reach for a cup with mod facilitation    Shoulder Instructions       General Comments      Pertinent Vitals/ Pain  Pain Assessment: Faces Faces Pain Scale: No hurt  Home Living                         Bathroom Toilet: Standard Bathroom Accessibility: Yes How Accessible: Accessible via walker        Lives With: Spouse;Family    Prior Functioning/Environment              Frequency           Progress Toward Goals  OT Goals(current goals can now be found in the care plan section)  Progress towards OT goals: Progressing toward goals     Plan Discharge plan remains appropriate    Co-evaluation                 End of  Session     Activity Tolerance Patient tolerated treatment well   Patient Left in chair;with call bell/phone within reach;with chair alarm set;with family/visitor present   Nurse Communication Mobility status        Time: [id number] OT Time Calculation (min): 41 min  Charges: OT General Charges $OT Visit: 1 Procedure OT Treatments $Neuromuscular Re-education: 38-52 mins  Grant Garcia M 03/13/2016, 2:30 PM

## 2016-03-13 NOTE — PMR Pre-admission (Signed)
PMR Admission Coordinator Pre-Admission Assessment  Patient: Grant Garcia is an 51 y.o., male MRN: [id number] DOB: 08/07/1965 Height: 5\' 6"  (167.6 cm) Weight: 73.6 kg (162 lb 4.1 oz)              Insurance Information  PRIMARY:  Uninsured  Medicaid Application Date:       Case Manager:  Disability Application Date:       Case Worker:   Emergency Conservator, museum/gallery Information    Name Relation Home Work Mobile   [place] Spouse [phone]  [phone]     Current Medical History  Patient Admitting Diagnosis: right MCA infarct  History of Present Illness: HPI: Grant Garcia a 51 y.o.right handed Spanish speaking maleon no prescription medications with history of tobacco abuse. Per chart review patient independent prior to admission living with family. 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 Presented to [facility] with left-sided weakness. Patient was transferred to [facility] for further evaluation. CT of the head showed evolving right MCA infarct as well as emergency large vessel occlusion on the right MCAper MRA.Marland KitchenMRI showed acute patchy multifocal right MCA territory infarct. Patient did not receive TPA. Neuro interventional radiology consulted underwent thrombectomy. Echocardiogram with ejection fraction of 55% no wall motion abnormalities. Neurology consulted currently on Plavix and aspirin for CVA prophylaxis and workup ongoing. Subcutaneous Lovenox for DVT prophylaxis. Dysphagia #1 thinliquid diet.  Total: 8 NIH  Past Medical History  Past Medical History:  Diagnosis Date  . Medical history non-contributory     Family History  family history is not on file.  Prior Rehab/Hospitalizations:  Has the patient had major surgery during 100 days prior to admission? No  Current Medications   Current Facility-Administered Medications:  .   stroke: mapping our early stages of recovery book, , Does not apply, Once,  Ulice Dash, PA-C .  0.9 %  sodium chloride infusion, , Intravenous, Continuous, Marvel Plan, MD, Last Rate: 100 mL/hr at 03/13/16 1400 .  acetaminophen (TYLENOL) tablet 650 mg, 650 mg, Oral, Q4H PRN **OR** [DISCONTINUED] acetaminophen (TYLENOL) solution 650 mg, 650 mg, Per Tube, Q4H PRN **OR** [DISCONTINUED] acetaminophen (TYLENOL) suppository 640 mg, 640 mg, Rectal, Q4H PRN, Ulice Dash, PA-C .  aspirin EC tablet 325 mg, 325 mg, Oral, Daily, 325 mg at 03/13/16 1045 **OR** [DISCONTINUED] aspirin suppository 300 mg, 300 mg, Rectal, Daily, Marvel Plan, MD .  atorvastatin (LIPITOR) tablet 80 mg, 80 mg, Oral, q1800, Marvel Plan, MD, 80 mg at 03/12/16 1809 .  clopidogrel (PLAVIX) tablet 75 mg, 75 mg, Oral, Daily, Marvel Plan, MD, 75 mg at 03/13/16 1045 .  enoxaparin (LOVENOX) injection 40 mg, 40 mg, Subcutaneous, Q24H, Marvel Plan, MD, 40 mg at 03/13/16 1045 .  famotidine (PEPCID) tablet 20 mg, 20 mg, Oral, Daily, Marvel Plan, MD, 20 mg at 03/13/16 1045 .  hydrALAZINE (APRESOLINE) injection 10 mg, 10 mg, Intravenous, Q2H PRN, Marvel Plan, MD .  ipratropium-albuterol (DUONEB) 0.5-2.5 (3) MG/3ML nebulizer solution 3 mL, 3 mL, Nebulization, Q4H PRN, Jose Angelo A de [place], MD .  ondansetron [facility]) injection 4 mg, 4 mg, Intravenous, Q6H PRN, [name], DO  Patients Current Diet: DIET - DYS 1 Room service appropriate? Yes; Fluid consistency: Thin  Precautions / Restrictions Precautions Precautions: Fall Restrictions Weight Bearing Restrictions: No   Has the patient had 2 or more falls or a fall with injury in the past year?No  Prior Activity Level Community (5-7x/wk): very hard working and independent; drives  Home  Assistive Devices / Equipment Home Assistive Devices/Equipment: None Home Equipment: None  Prior Device Use: Indicate devices/aids used by the patient prior to current illness, exacerbation or injury? None of the above  Prior Functional Level Prior Function Level of Independence:  Independent Comments: Pt works as Administratorlandscaper.  Self Care: Did the patient need help bathing, dressing, using the toilet or eating?  Independent  Indoor Mobility: Did the patient need assistance with walking from room to room (with or without device)? Independent  Stairs: Did the patient need assistance with internal or external stairs (with or without device)? Independent  Functional Cognition: Did the patient need help planning regular tasks such as shopping or remembering to take medications? Independent  Current Functional Level Cognition  Arousal/Alertness: Awake/alert Overall Cognitive Status: Impaired/Different from baseline Current Attention Level: Sustained Orientation Level: Oriented to person, Oriented to place, Oriented to situation, Oriented to time Following Commands: Follows multi-step commands inconsistently Safety/Judgement: Decreased awareness of safety, Decreased awareness of deficits General Comments: Pt knows he's in a hospital, but [facility] [facility].   When asked, he states he's in hospital because he's sick.  He states he is normal with no deficits.  Attention: Focused, Alternating Focused Attention: Appears intact Alternating Attention: Appears intact Memory: Appears intact Awareness: Impaired Awareness Impairment: Emergent impairment Problem Solving: Appears intact Safety/Judgment: Appears intact    Extremity Assessment (includes Sensation/Coordination)  Upper Extremity Assessment: LUE deficits/detail LUE Deficits / Details: Pt with minimal spontaneous finger flex/extension, and trace elbow flex.  PROM WFL  LUE Sensation: decreased proprioception LUE Coordination: decreased fine motor, decreased gross motor  Lower Extremity Assessment: Defer to PT evaluation LLE Deficits / Details: Pt able to move LLE against gravity; knee instability with standing.     ADLs  Overall ADL's : Needs assistance/impaired Grooming: Wash/dry hands, Wash/dry  face, Brushing hair, Moderate assistance, Sitting Grooming Details (indicate cue type and reason): assist to comb left side  Upper Body Bathing: Moderate assistance, Sitting Lower Body Bathing: Maximal assistance, Sit to/from stand Upper Body Dressing : Maximal assistance, Sitting Lower Body Dressing: Total assistance, Sit to/from stand Toilet Transfer: Moderate assistance, Stand-pivot, BSC Toileting- Clothing Manipulation and Hygiene: Maximal assistance, Sit to/from stand Functional mobility during ADLs: Maximal assistance General ADL Comments: Yvette RackUtilized Graciella for interpreting     Mobility  Overal bed mobility: Needs Assistance Bed Mobility: Supine to Sit Supine to sit: Mod assist Sit to supine: Max assist General bed mobility comments: faciliation for use of Lt UE and Lt LE.  assist to lift trunk     Transfers  Overall transfer level: Needs assistance Equipment used: 1 person hand held assist Transfers: Sit to/from Stand, Stand Pivot Transfers Sit to Stand: Mod assist Stand pivot transfers: Mod assist General transfer comment: facilitation for anterior translation of trunk and for hip extension and to advance Lt LE     Ambulation / Gait / Stairs / Engineer, drillingWheelchair Mobility       Posture / Balance Dynamic Sitting Balance Sitting balance - Comments: Loses balance to the Lt, but pt able to correct today with min A and min verbal cues  Balance Overall balance assessment: Needs assistance Sitting-balance support: Feet supported Sitting balance-Leahy Scale: Poor Sitting balance - Comments: Loses balance to the Lt, but pt able to correct today with min A and min verbal cues  Postural control: Left lateral lean Standing balance support: Single extremity supported Standing balance-Leahy Scale: Poor Standing balance comment: mod A to maintain standing     Special needs/care  consideration BiPAP/CPAP  N/a CPM  N/a Continuous Drip IV  N/a Dialysis  N/a Life Vest  N/a Oxygen   N/a Special Bed  N/a Trach Size  N/a Wound Vac (area)  N/a Skin intact                            Bowel mgmt: LBM 2/13 continent Bladder mgmt: continent Diabetic mgmt Hgb A1 c 5.6 Interpreter arranged for therapy sessions Two adult children fluent [place]; pt and wife primary language Spanish    Previous Home Environment Living Arrangements: Spouse/significant other, Children  Lives With:  (two chidlren and son and daughter adults) Available Help at Discharge: Family, Available 24 hours/day Type of Home: House Home Layout:  (basement and one level) Home Access: Level entry Bathroom Shower/Tub: Health visitor: Standard Bathroom Accessibility: Yes How Accessible: Accessible via walker Home Care Services: No  Discharge Living Setting Plans for Discharge Living Setting: Patient's home, Lives with (comment) (spouse and family) Type of Home at Discharge: House Discharge Home Layout: Able to live on main level with bedroom/bathroom Discharge Home Access: Level entry Discharge Bathroom Shower/Tub: Walk-in shower Discharge Bathroom Toilet: Standard Discharge Bathroom Accessibility: Yes How Accessible: Accessible via walker Does the patient have any problems obtaining your medications?: Yes (Describe) (no insurance)  Social/Family/Support Systems Patient Roles: Spouse, Parent, Other (Comment) (employee) Anticipated Caregiver: wife and family Anticipated Caregiver's Contact Information: see above Caregiver Availability: 24/7 Discharge Plan Discussed with Primary Caregiver: Yes Is Caregiver In Agreement with Plan?: Yes Does Caregiver/Family have Issues with Lodging/Transportation while Pt is in Rehab?: No (wife satys with pt in hospital)   Goals/Additional Needs Patient/Family Goal for Rehab: supervision to min with PT, supervsiion to Mod with OT, supervision with SLP Expected length of stay: ELOS 20-26 days Cultural Considerations: Hispanic; interpreter needed,  daughter speaks [place]'  Special Service Needs: interpreter needed Pt/Family Agrees to Admission and willing to participate: Yes Program Orientation Provided & Reviewed with Pt/Caregiver Including Roles  & Responsibilities: Yes  Decrease burden of Care through IP rehab admission: n/a  Possible need for SNF placement upon discharge: not anticipated  Patient Condition: This patient's condition remains as documented in the consult dated 03/12/2016, in which the Rehabilitation Physician determined and documented that the patient's condition is appropriate for intensive rehabilitative care in an inpatient rehabilitation facility. Will admit to inpatient rehab Saturday 03/14/16 when bed is available.  Preadmission Screen Completed By:  Clois Dupes, 03/13/2016 3:10 PM ______________________________________________________________________   Discussed status with Dr. Riley Kill on 03/13/16 at  1512 and received telephone approval for admission on Saturday 03/14/16 when bed is available.  Admission Coordinator:  Clois Dupes, time 4098 Date 03/14/2015

## 2016-03-13 NOTE — Evaluation (Signed)
Speech Language Pathology Evaluation Patient Details Name: Grant Garcia MRN: [id number] DOB: 08/22/1965 Today's Date: 03/13/2016 Time: 1100-1520 SLP Time Calculation (min) (ACUTE ONLY): 260 min  Problem List:  Patient Active Problem List   Diagnosis Date Noted  . Smoker   . Hyperlipidemia   . CVA (cerebral vascular accident) (HCC) 03/11/2016  . Respiratory failure [facility])    Past Medical History:  Past Medical History:  Diagnosis Date  . Medical history non-contributory    Past Surgical History:  Past Surgical History:  Procedure Laterality Date  . IR GENERIC HISTORICAL  03/11/2016   IR ANGIO VERTEBRAL SEL VERTEBRAL UNI R MOD SED 03/11/2016 Grant Mor, DO [facility]-INTERV RAD  . IR GENERIC HISTORICAL  03/11/2016   IR ANGIO INTRA EXTRACRAN SEL COM CAROTID INNOMINATE UNI L MOD SED 03/11/2016 Grant Mor, DO [facility]  . IR GENERIC HISTORICAL  03/11/2016   IR PERCUTANEOUS ART THROMBECTOMY/INFUSION INTRACRANIAL INC DIAG ANGIO 03/11/2016 Grant Mor, DO [facility]  . IR GENERIC HISTORICAL  03/11/2016   IR US GUIDE VASC ACCESS RIGHT 03/11/2016 Grant Mor, DO [facility]  . RADIOLOGY WITH ANESTHESIA N/A 03/11/2016   Procedure: RADIOLOGY WITH ANESTHESIA;  Surgeon: Medication Radiologist, MD;  Location: [facility] OR;  Service: Radiology;  Laterality: N/A;   HPI:  [name], current smoker (1.5 - 2ppd x 30 years) with no PMH who transferred to [facility] on 2/14 from [facility] ER with worsening left sided weakness. CT of the head that was concerning for emergent large vessel occlusion of the right MCA. MRI pending. He is s/p attempted thrombectomy but with persistent occlusion. He was intubated 2/14 for procedure only.    Assessment / Plan / Recommendation Clinical Impression  Pts primary impairments include left neglect and dysarthria. Pt is 80% intelligible at conversation level. Need min contextual cues to redirect attention to left visual field. Reorganized room to place wifes chair on  left side of pt and offered a few suggestions to improve speech intelligibility, though pt will need f/u. Recommend CIR at d/c.     SLP Assessment  Patient needs continued Speech Lanaguage Pathology Services    Follow Up Recommendations  Inpatient Rehab    Frequency and Duration min 2x/week  2 weeks      SLP Evaluation Cognition  Overall Cognitive Status: Impaired/Different from baseline Arousal/Alertness: Awake/alert Orientation Level: Oriented to person;Oriented to place;Oriented to situation;Oriented to time Attention: Focused;Alternating Focused Attention: Appears intact Alternating Attention: Appears intact Memory: Appears intact Awareness: Impaired Awareness Impairment: Emergent impairment Problem Solving: Appears intact Safety/Judgment: Appears intact       Comprehension  Auditory Comprehension Overall Auditory Comprehension: Appears within functional limits for tasks assessed    Expression Verbal Expression Overall Verbal Expression: Appears within functional limits for tasks assessed   Oral / Motor  Oral Motor/Sensory Function Overall Oral Motor/Sensory Function: Moderate impairment Facial ROM: Reduced left;Suspected CN VII (facial) dysfunction Facial Symmetry: Abnormal symmetry left;Suspected CN VII (facial) dysfunction Facial Strength: Reduced left;Suspected CN VII (facial) dysfunction Facial Sensation: Reduced left;Suspected CN V (Trigeminal) dysfunction Lingual ROM: Reduced left;Suspected CN XII (hypoglossal) dysfunction Lingual Symmetry: Abnormal symmetry left;Suspected CN XII (hypoglossal) dysfunction Lingual Strength: Suspected CN XII (hypoglossal) dysfunction;Reduced Lingual Sensation: Suspected CN VII (facial) dysfunction-anterior 2/3 tongue;Reduced Motor Speech Overall Motor Speech: Impaired Respiration: Within functional limits Phonation: Normal Resonance: Within functional limits Articulation: Impaired Level of Impairment: Word Intelligibility:  Intelligible Motor Planning: Witnin functional limits   GO  Grant DittyBonnie America Sandall, MA CCC-SLP [phone]  Grant Garcia, Grant Garcia 03/13/2016, 3:05 PM

## 2016-03-13 NOTE — Progress Notes (Signed)
I have arranged for interpreter, Ashby DawesGraciela, to meet me with pt and wife to discuss possible inpt  rehab admission on Saturday at 1415 today. [phone]

## 2016-03-14 ENCOUNTER — Encounter (HOSPITAL_COMMUNITY): Payer: Self-pay

## 2016-03-14 ENCOUNTER — Inpatient Hospital Stay (HOSPITAL_COMMUNITY)
Admission: RE | Admit: 2016-03-14 | Discharge: 2016-04-04 | DRG: 057 | Disposition: A | Discharge status: Home / Self Care | Payer: Self-pay | Source: Intra-hospital | Attending: Physical Medicine & Rehabilitation | Admitting: Physical Medicine & Rehabilitation

## 2016-03-14 DIAGNOSIS — R001 Bradycardia, unspecified: Secondary | ICD-10-CM

## 2016-03-14 DIAGNOSIS — E785 Hyperlipidemia, unspecified: Secondary | ICD-10-CM

## 2016-03-14 DIAGNOSIS — I69391 Dysphagia following cerebral infarction: Secondary | ICD-10-CM

## 2016-03-14 DIAGNOSIS — G8114 Spastic hemiplegia affecting left nondominant side: Secondary | ICD-10-CM | POA: Diagnosis present

## 2016-03-14 DIAGNOSIS — F4322 Adjustment disorder with anxiety: Secondary | ICD-10-CM

## 2016-03-14 DIAGNOSIS — M25522 Pain in left elbow: Secondary | ICD-10-CM

## 2016-03-14 DIAGNOSIS — F4323 Adjustment disorder with mixed anxiety and depressed mood: Secondary | ICD-10-CM

## 2016-03-14 DIAGNOSIS — F17210 Nicotine dependence, cigarettes, uncomplicated: Secondary | ICD-10-CM

## 2016-03-14 DIAGNOSIS — R414 Neurologic neglect syndrome: Secondary | ICD-10-CM | POA: Diagnosis present

## 2016-03-14 DIAGNOSIS — R1310 Dysphagia, unspecified: Secondary | ICD-10-CM

## 2016-03-14 DIAGNOSIS — I69354 Hemiplegia and hemiparesis following cerebral infarction affecting left non-dominant side: Principal | ICD-10-CM

## 2016-03-14 DIAGNOSIS — R252 Cramp and spasm: Secondary | ICD-10-CM

## 2016-03-14 DIAGNOSIS — F329 Major depressive disorder, single episode, unspecified: Secondary | ICD-10-CM

## 2016-03-14 DIAGNOSIS — I63511 Cerebral infarction due to unspecified occlusion or stenosis of right middle cerebral artery: Secondary | ICD-10-CM | POA: Diagnosis present

## 2016-03-14 DIAGNOSIS — I69322 Dysarthria following cerebral infarction: Secondary | ICD-10-CM

## 2016-03-14 DIAGNOSIS — G479 Sleep disorder, unspecified: Secondary | ICD-10-CM

## 2016-03-14 DIAGNOSIS — G4700 Insomnia, unspecified: Secondary | ICD-10-CM

## 2016-03-14 DIAGNOSIS — G8194 Hemiplegia, unspecified affecting left nondominant side: Secondary | ICD-10-CM

## 2016-03-14 LAB — BASIC METABOLIC PANEL
Anion gap: 9 (ref 5–15)
BUN: 9 mg/dL (ref 6–20)
CHLORIDE: 107 mmol/L (ref 101–111)
CO2: 24 mmol/L (ref 22–32)
CREATININE: 0.6 mg/dL — AB (ref 0.61–1.24)
Calcium: 9.1 mg/dL (ref 8.9–10.3)
GFR calc Af Amer: >60 mL/min (ref >60)
GFR calc non Af Amer: >60 mL/min (ref >60)
Glucose, Bld: 104 mg/dL — ABNORMAL HIGH (ref 65–99)
Potassium: 3.8 mmol/L (ref 3.5–5.1)
SODIUM: 140 mmol/L (ref 135–145)

## 2016-03-14 LAB — CBC
HCT: 41 % (ref 39.0–52.0)
HEMOGLOBIN: 13.7 g/dL (ref 13.0–17.0)
MCH: 28.1 pg (ref 26.0–34.0)
MCHC: 33.4 g/dL (ref 30.0–36.0)
MCV: 84 fL (ref 78.0–100.0)
Platelets: 142 10*3/uL — ABNORMAL LOW (ref 150–400)
RBC: 4.88 MIL/uL (ref 4.22–5.81)
RDW: 12.8 % (ref 11.5–15.5)
WBC: 7.9 10*3/uL (ref 4.0–10.5)

## 2016-03-14 LAB — TRIGLYCERIDES: TRIGLYCERIDES: 101 mg/dL (ref <150)

## 2016-03-14 MED ORDER — ENOXAPARIN SODIUM 40 MG/0.4ML ~~LOC~~ SOLN
40.0000 mg | SUBCUTANEOUS | Status: DC
  Administered 2016-03-15 – 2016-04-03 (×20): 40 mg via SUBCUTANEOUS
  Filled 2016-03-14 (×20): qty 0.4

## 2016-03-14 MED ORDER — CLOPIDOGREL BISULFATE 75 MG PO TABS
75.0000 mg | ORAL_TABLET | Freq: Every day | ORAL | Status: DC
  Administered 2016-03-15 – 2016-04-04 (×21): 75 mg via ORAL
  Filled 2016-03-14 (×21): qty 1

## 2016-03-14 MED ORDER — FAMOTIDINE 20 MG PO TABS
20.0000 mg | ORAL_TABLET | Freq: Every day | ORAL | Status: DC
  Administered 2016-03-15 – 2016-04-04 (×21): 20 mg via ORAL
  Filled 2016-03-14 (×21): qty 1

## 2016-03-14 MED ORDER — ATORVASTATIN CALCIUM 80 MG PO TABS
80.0000 mg | ORAL_TABLET | Freq: Every day | ORAL | Status: DC
  Administered 2016-03-14 – 2016-04-03 (×21): 80 mg via ORAL
  Filled 2016-03-14 (×21): qty 1

## 2016-03-14 MED ORDER — ENOXAPARIN SODIUM 40 MG/0.4ML ~~LOC~~ SOLN: 40.0000 mg | SUBCUTANEOUS | Status: DC

## 2016-03-14 MED ORDER — ASPIRIN EC 325 MG PO TBEC
325.0000 mg | DELAYED_RELEASE_TABLET | Freq: Every day | ORAL | Status: DC
  Administered 2016-03-15 – 2016-03-21 (×7): 325 mg via ORAL
  Filled 2016-03-14 (×8): qty 1

## 2016-03-14 MED ORDER — ACETAMINOPHEN 325 MG PO TABS
650.0000 mg | ORAL_TABLET | ORAL | Status: DC | PRN
  Administered 2016-03-18: 650 mg via ORAL
  Filled 2016-03-14: qty 2

## 2016-03-14 MED ORDER — SORBITOL 70 % SOLN
30.0000 mL | Freq: Every day | Status: DC | PRN
  Administered 2016-03-24 – 2016-03-25 (×2): 30 mL via ORAL
  Filled 2016-03-14 (×2): qty 30

## 2016-03-14 MED ORDER — IPRATROPIUM-ALBUTEROL 0.5-2.5 (3) MG/3ML IN SOLN: 3.0000 mL | RESPIRATORY_TRACT | Status: DC | PRN

## 2016-03-14 MED ORDER — BISACODYL 10 MG RE SUPP
10.0000 mg | Freq: Every day | RECTAL | Status: DC | PRN
  Administered 2016-03-14: 10 mg via RECTAL
  Filled 2016-03-14: qty 1

## 2016-03-14 MED ORDER — ONDANSETRON HCL 4 MG/2ML IJ SOLN: 4.0000 mg | Freq: Four times a day (QID) | INTRAMUSCULAR | Status: DC | PRN

## 2016-03-14 MED ORDER — ONDANSETRON HCL 4 MG PO TABS: 4.0000 mg | ORAL_TABLET | Freq: Four times a day (QID) | ORAL | Status: DC | PRN

## 2016-03-14 NOTE — Progress Notes (Signed)
Pt arrived on the unit approximately 1700. Patient oriented to room and unit. Spouse at bedside.

## 2016-03-14 NOTE — Discharge Summary (Signed)
Stroke Discharge Summary  Patient ID: Grant Garcia    l   MRN: [id number]      DOB: 06-Dec-1965  Date of Admission: 03/11/2016 Date of Discharge: 03/14/2016  Attending Physician:  Marvel PlanJindong Xu, MD, Stroke MD Consultant(s):   pulmonary/intensive care and rehabilitation medicine Patient's PCP:  No primary care provider on file.  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 Active Problems:   CVA (cerebral vascular accident) (HCC)   Respiratory failure (HCC)   Smoker   Hyperlipidemia BMI  Body mass index is 26.19 kg/m.    Past Medical History:  Diagnosis Date  . Medical history non-contributory    Past Surgical History:  Procedure Laterality Date  . IR GENERIC HISTORICAL  03/11/2016   IR ANGIO VERTEBRAL SEL VERTEBRAL UNI R MOD SED 03/11/2016 Gilmer MorJaime Wagner, DO [facility]  . IR GENERIC HISTORICAL  03/11/2016   IR ANGIO INTRA EXTRACRAN SEL COM CAROTID INNOMINATE UNI L MOD SED 03/11/2016 Gilmer MorJaime Wagner, DO [facility]  . IR GENERIC HISTORICAL  03/11/2016   IR PERCUTANEOUS ART THROMBECTOMY/INFUSION INTRACRANIAL INC DIAG ANGIO 03/11/2016 Gilmer MorJaime Wagner, DO [facility]  . IR GENERIC HISTORICAL  03/11/2016   IR US GUIDE VASC ACCESS RIGHT 03/11/2016 Gilmer MorJaime Wagner, DO [facility]  . RADIOLOGY WITH ANESTHESIA N/A 03/11/2016   Procedure: RADIOLOGY WITH ANESTHESIA;  Surgeon: Medication Radiologist, MD;  Location: [facility] OR;  Service: Radiology;  Laterality: N/A;    Medications to be continued on Rehab .  stroke: mapping our early stages of recovery book   Does not apply Once  . aspirin EC  325 mg Oral Daily  . atorvastatin  80 mg Oral q1800  . clopidogrel  75 mg Oral Daily  . enoxaparin (LOVENOX) injection  40 mg Subcutaneous Q24H  . famotidine  20 mg Oral Daily    LABORATORY STUDIES CBC    Component Value Date/Time   WBC 7.9 03/14/2016 0628   RBC 4.88 03/14/2016 0628   HGB 13.7 03/14/2016 0628   HCT 41.0 03/14/2016 0628   PLT 142 (L) 03/14/2016 0628   MCV  84.0 03/14/2016 0628   MCH 28.1 03/14/2016 0628   MCHC 33.4 03/14/2016 0628   RDW 12.8 03/14/2016 0628   LYMPHSABS 2.5 03/11/2016 0920   MONOABS 0.4 03/11/2016 0920   EOSABS 0.2 03/11/2016 0920   BASOSABS 0.0 03/11/2016 0920   CMP    Component Value Date/Time   NA 140 03/14/2016 0628   K 3.8 03/14/2016 0628   CL 107 03/14/2016 0628   CO2 24 03/14/2016 0628   GLUCOSE 104 (H) 03/14/2016 0628   BUN 9 03/14/2016 0628   CREATININE 0.60 (L) 03/14/2016 0628   CALCIUM 9.1 03/14/2016 0628   GFRNONAA >60 03/14/2016 0628   GFRAA >60 03/14/2016 0628   COAGS Lab Results  Component Value Date   INR 0.98 03/11/2016   Lipid Panel    Component Value Date/Time   CHOL 190 03/12/2016 0500   TRIG 118 03/12/2016 0500   HDL 32 (L) 03/12/2016 0500   CHOLHDL 5.9 03/12/2016 0500   VLDL 24 03/12/2016 0500   LDLCALC 134 (H) 03/12/2016 0500   HgbA1C  Lab Results  Component Value Date   HGBA1C 5.6 03/12/2016   Urinalysis No results found for: COLORURINE, APPEARANCEUR, LABSPEC, PHURINE, GLUCOSEU, HGBUR, BILIRUBINUR, KETONESUR, PROTEINUR, UROBILINOGEN, NITRITE, LEUKOCYTESUR Urine Drug Screen     Component Value Date/Time   LABOPIA NONE DETECTED 03/12/2016 1611   COCAINSCRNUR NONE DETECTED 03/12/2016 1611   LABBENZ NONE DETECTED 03/12/2016  1611   AMPHETMU NONE DETECTED 03/12/2016 1611   THCU NONE DETECTED 03/12/2016 1611   LABBARB NONE DETECTED 03/12/2016 1611    Alcohol Level No results found for: [facility]   SIGNIFICANT DIAGNOSTIC STUDIES  Ct Head Code Stroke Wo Contrast` 03/11/2016 1. Stable noncontrast CT appearance of the brain since 0544 hours today: Hypodensity in the right lentiform nucleus, but no other CT changes of right MCA infarct. No acute intracranial hemorrhage.  2. ASPECTS is 9.    Ct Cerebral Perfusion W Contrast 03/11/2016 Large area of right MCA territory penumbra. Clinically suspected small volume core infarct in the right lentiform not detected. Overall very  favorable CTP characteristics for endovascular reperfusion.     Cerebral angio 03/11/2016 Status post cerebral angiogram with attempted mechanical thrombectomy of right MCA occlusion.  After 2 passes with local aspiration and stentreiver technology, there is persistent occlusion and TICI 1 flow.  Excellent collateral flow perfusing the right MCA territory.  My impression is that there is likely underlying stenosis at the site of occlusion given the collateral flow, and that further manipulation may cause dissection or hemorrhage. Given the excellent collateral flow and absence of core infarction on the perfusion imaging, we elected to defer further attempts at thrombectomy. Deployment of Exoseal for hemostasis.    Ct Head Wo Contrast 03/11/2016 Stable postprocedural CT. No progression of the lenticulostriate infarct and no acute hemorrhage.    Mr Brain Wo Contrast 03/12/2016 Acute patchy multifocal RIGHT MCA territory infarct. No hemorrhage.    Mr Maxine Glenn Head/brain Wo Cm 03/12/2016 Moderately motion degraded examination. RIGHT M1 occlusion, no collateral vessels by time-of-flight technique.    TTE - Left ventricle: The cavity size was normal. Systolic function was normal. The estimated ejection fraction was in the range of 55% to 60%. Wall motion was normal; there were no regional wall motion abnormalities. Marked bradycardia (44 bpm) limits evaluation of LV diastolic function.     HISTORY OF PRESENT ILLNESS Grant Garcia is an 51 y.o. male who is Spanish-speaking only.  Hx was obtained from staff as no family was present. The patient went to bed feeling weak all over the night before. He woke up the morning of admission and noticed left sided weakness. He presented to the emergency department for further evaluation.  Date last known well: Date: 03/10/2016 Time last known well: Time: 22:00 tPA Given: No: out of window   HOSPITAL COURSE A Ct Cerebral Perfusion scan  W Contrast was performed which revealed a large area of right MCA territory penumbra. Clinically suspected small volume core infarct in the right lentiform not detected. Overall very favorable CTP characteristics for endovascular reperfusion. The patient was prepped and taken to interventional radiology for a cerebral angiogram and attempted mechanical thrombectomy of right MCA occlusion. After 2 passes with local aspiration and stentreiver technology, there is persistent occlusion and TICI 1 flow. Excellent collateral flow perfusing the right MCA territory. Dr. Kenna Gilbert impression was that there was likely an underlying stenosis at the site of the occlusion. Given the collateral flow, the possibility that further manipulation could cause dissection or hemorrhage and the absence of core infarction on the perfusion imaging, Dr Loreta Ave elected to defer further attempts at thrombectomy. The patient was admitted to the neuro intensive care unit for further monitoring and treatment.   Stroke:  Patchy R MCA territory infarcts in setting of ? chronic R MCA stenosis/occlusion with collateral from ACA. Infarcts secondary to large vessel atherosclerosis and failed collateral flow.  - however, pt  significant weakness on the left not consistent with chronic MCA occlusion.   Resultant  Left hemiparesis, left facial droop  MRI  Patchy R MCA territory infarct  MRA  Right M1 cut off  Cerebral angio - ? Chronic right M1 occlusion with excellent collateral flow from right ACA  2D Echo  EF 55-60%  Recommend 30 day cardiac event monitoring to rule out afib. If no afib found, will need to consider TEE/loop recorder  LDL 134  HgbA1c 5.6  lovenox for VTE prophylaxis  DIET - DYS 1 Room service appropriate? Yes; Fluid consistency: Thin  No antithrombotic prior to admission, now on aspirin 325 mg daily and clopidogrel 75 mg daily due to intracranial stenosis.   Patient counseled to be compliant with his  antithrombotic medications  Ongoing aggressive stroke risk factor management  Therapy recommendations: Inpatient rehabilitation recommended  Disposition:   The patient was discharged from the medical floor and admitted to [facility] inpatient rehabilitation.  Tobacco abuse  Current heavy smoker  Smoking cessation counseling will be provided  BP management  On the low side              Permissive hypertension (OK if < 220/120) but gradually normalize in 5-7 days              On IVF for BP               Long-term BP goal 130-150 due to right M1 occlusion  Hyperlipidemia  Home meds:  none  LDL 134, goal < 70  Add lipitor 80  Continue statin at discharge  Other Stroke Risk Factors  Suspect Obstructive sleep apnea - pt does snore during sleep but family not sure about apnea - will need outpt sleep study.  Other Active Problems  Leukocystosis   Bradycardia - asymptomatic  DISCHARGE EXAM Blood pressure 133/89, pulse (!) 50, temperature 97.4 F (36.3 C), temperature source Oral, resp. rate 18, height 5\' 6"  (1.676 m), weight 73.6 kg (162 lb 4.1 oz), SpO2 98 %.   General - Well nourished, well developed, lethargic.  Ophthalmologic - Fundi not visualized due to noncooperation.  Cardiovascular - Regular rate and rhythm.  Mental Status -  Drowsy sleepy, barely open eyes on voice, did not answer questions for orientation. Spanish speaking only, moderate dysarthria, not cooperative on language exam.   Cranial Nerves II - XII - II - inconsistently blinking to visual threat due to lethargy. III, IV, VI - attending to both sides, but more right gaze preference but able to cross midline. V - Facial sensation intact bilaterally. VII - left significant facial droop. VIII - Hearing & vestibular intact bilaterally. X - Palate elevates symmetrically, moderate dysarthria. XI - Chin turning & shoulder shrug intact bilaterally. XII - Tongue protrusion intact.  Motor  Strength - The patient's strength was normal in RUE and RLE, 0/5 LUE and 2/5 LLE, on pain stimulation, 2/5 withdraw of LUE.  Bulk was normal and fasciculations were absent.   Motor Tone - Muscle tone was assessed at the neck and appendages and was decreased on the left.  Reflexes - The patient's reflexes were 1+ in all extremities and he had no pathological reflexes.  Sensory - Light touch, temperature/pinprick were assessed and were symmetrical.    Coordination - not cooperative on exam.  Tremor was absent.  Gait and Station - not tested.   Discharge Diet  DIET - DYS 1 Room service appropriate? Yes; Fluid consistency: Thin liquids  DISCHARGE PLAN  Disposition:  Transfer to [facility] for ongoing PT, OT and ST  aspirin 325 mg daily and clopidogrel 75 mg daily for secondary stroke prevention.  Recommend ongoing risk factor control by Primary Care Physician at time of discharge from inpatient rehabilitation.  The patient needs a primary care provider for further follow-up and risk factor modification.  Follow-up with Dr. Marvel Plan, Stroke Clinic in 6 weeks after discharge.  35 minutes were spent preparing discharge.  Delton See PA-C [facility] Pager [phone] 03/14/2016, 4:12 PM

## 2016-03-14 NOTE — Progress Notes (Signed)
Standley BrookingBarbara G Boden Stucky, RN Rehab Admission Coordinator Signed Physical Medicine and Rehabilitation  PMR Pre-admission Date of Service: 03/13/2016 3:05 PM  Related encounter: [facility] (Discharged[facility]       [] Hide copied text PMR Admission Coordinator Pre-Admission Assessment  Patient: Grant Garcia is an 51 y.o., male MRN: [id number] DOB: 09/27/1965 Height: 5\' 6"  (167.6 cm) Weight: 73.6 kg (162 lb 4.1 oz)                                                                                                                                                  Insurance Information  PRIMARY:  Uninsured  Medicaid Application Date:       Case Manager:  Disability Application Date:       Case Worker:   Emergency ActuaryContact Information        Contact Information    Name Relation Home Work Mobile   Grant Garcia Spouse [phone]  [phone]     Current Medical History  Patient Admitting Diagnosis: right MCA infarct  History of Present Illness: HPI: Grant Martinezis a 51 y.o.right handed Spanish speaking maleon no prescription medications with history of tobacco abuse. Per chart review patient independent prior to admission living with family. He works as a Administratorlandscaper. Family can provide assistance. [facility]. [facility]. CT of the head showed evolving right MCA infarct as well as emergency large vessel occlusion on the right MCAper MRA.[name].MRI showed acute patchy multifocal right MCA territory infarct. Patient did not receive TPA. Neuro interventional radiology consulted underwent thrombectomy. Echocardiogram with ejection fraction of 55% no wall motion abnormalities. Neurology consulted currently on Plavix and aspirin for CVA prophylaxis and workup ongoing. Subcutaneous Lovenox for DVT prophylaxis. Dysphagia #1 thinliquid  diet.  Total: 8 NIH  Past Medical History      Past Medical History:  Diagnosis Date  . Medical history non-contributory     Family History  family history is not on file.  Prior Rehab/Hospitalizations:  Has the patient had major surgery during 100 days prior to admission? No  Current Medications   Current Facility-Administered Medications:  .   stroke: mapping our early stages of recovery book, , Does not apply, Once, Grant Dashavid R Smith, PA-C .  0.9 %  sodium chloride infusion, , Intravenous, Continuous, Grant PlanJindong Xu, MD, Last Rate: 100 mL/hr at 03/13/16 1400 .  acetaminophen (TYLENOL) tablet 650 mg, 650 mg, Oral, Q4H PRN **OR** [DISCONTINUED] acetaminophen (TYLENOL) solution 650 mg, 650 mg, Per Tube, Q4H PRN **OR** [DISCONTINUED] acetaminophen (TYLENOL) suppository 640 mg, 640 mg, Rectal, Q4H PRN, Grant Dashavid R Smith, PA-C .  aspirin EC tablet 325 mg, 325 mg, Oral, Daily, 325 mg at 03/13/16 1045 **OR** [DISCONTINUED] aspirin suppository 300 mg, 300 mg, Rectal, Daily, Grant PlanJindong Xu, MD .  atorvastatin (LIPITOR) tablet 80 mg, 80 mg, Oral, q1800, Grant Plan, MD, 80 mg at 03/12/16 1809 .  clopidogrel (PLAVIX) tablet 75 mg, 75 mg, Oral, Daily, Grant Plan, MD, 75 mg at 03/13/16 1045 .  enoxaparin (LOVENOX) injection 40 mg, 40 mg, Subcutaneous, Q24H, Grant Plan, MD, 40 mg at 03/13/16 1045 .  famotidine (PEPCID) tablet 20 mg, 20 mg, Oral, Daily, Grant Plan, MD, 20 mg at 03/13/16 1045 .  hydrALAZINE (APRESOLINE) injection 10 mg, 10 mg, Intravenous, Q2H PRN, Grant Plan, MD .  ipratropium-albuterol (DUONEB) 0.5-2.5 (3) MG/3ML nebulizer solution 3 mL, 3 mL, Nebulization, Q4H PRN, Grant Angelo A de [place], MD .  ondansetron [facility]) injection 4 mg, 4 mg, Intravenous, Q6H PRN, Grant Mor, DO  Patients Current Diet: DIET - DYS 1 Room service appropriate? Yes; Fluid consistency: Thin  Precautions / Restrictions Precautions Precautions: Fall Restrictions Weight Bearing Restrictions: No   Has the  patient had 2 or more falls or a fall with injury in the past year?No  Prior Activity Level Community (5-7x/wk): very hard working and independent; Programmer, multimedia / Equipment Home Assistive Devices/Equipment: None Home Equipment: None  Prior Device Use: Indicate devices/aids used by the patient prior to current illness, exacerbation or injury? None of the above  Prior Functional Level Prior Function Level of Independence: Independent Comments: Pt works as Administrator.  Self Care: Did the patient need help bathing, dressing, using the toilet or eating?  Independent  Indoor Mobility: Did the patient need assistance with walking from room to room (with or without device)? Independent  Stairs: Did the patient need assistance with internal or external stairs (with or without device)? Independent  Functional Cognition: Did the patient need help planning regular tasks such as shopping or remembering to take medications? Independent  Current Functional Level Cognition  Arousal/Alertness: Awake/alert Overall Cognitive Status: Impaired/Different from baseline Current Attention Level: Sustained Orientation Level: Oriented to person, Oriented to place, Oriented to situation, Oriented to time Following Commands: Follows multi-step commands inconsistently Safety/Judgement: Decreased awareness of safety, Decreased awareness of deficits General Comments: Pt knows he's in a hospital, but thinks he's in [facility].   When asked, he states he's in hospital because he's sick.  He states he is normal with no deficits.  Attention: Focused, Alternating Focused Attention: Appears intact Alternating Attention: Appears intact Memory: Appears intact Awareness: Impaired Awareness Impairment: Emergent impairment Problem Solving: Appears intact Safety/Judgment: Appears intact    Extremity Assessment (includes Sensation/Coordination)  Upper Extremity Assessment: LUE  deficits/detail LUE Deficits / Details: Pt with minimal spontaneous finger flex/extension, and trace elbow flex.  PROM WFL  LUE Sensation: decreased proprioception LUE Coordination: decreased fine motor, decreased gross motor  Lower Extremity Assessment: Defer to PT evaluation LLE Deficits / Details: Pt able to move LLE against gravity; knee instability with standing.     ADLs  Overall ADL's : Needs assistance/impaired Grooming: Wash/dry hands, Wash/dry face, Brushing hair, Moderate assistance, Sitting Grooming Details (indicate cue type and reason): assist to comb left side  Upper Body Bathing: Moderate assistance, Sitting Lower Body Bathing: Maximal assistance, Sit to/from stand Upper Body Dressing : Maximal assistance, Sitting Lower Body Dressing: Total assistance, Sit to/from stand Toilet Transfer: Moderate assistance, Stand-pivot, BSC Toileting- Clothing Manipulation and Hygiene: Maximal assistance, Sit to/from stand Functional mobility during ADLs: Maximal assistance General ADL Comments: Yvette Rack for interpreting     Mobility  Overal bed mobility: Needs Assistance Bed Mobility: Supine to Sit Supine to sit: Mod assist Sit to supine:  Max assist General bed mobility comments: faciliation for use of Lt UE and Lt LE.  assist to lift trunk     Transfers  Overall transfer level: Needs assistance Equipment used: 1 person hand held assist Transfers: Sit to/from Stand, Stand Pivot Transfers Sit to Stand: Mod assist Stand pivot transfers: Mod assist General transfer comment: facilitation for anterior translation of trunk and for hip extension and to advance Lt LE     Ambulation / Gait / Stairs / Engineer, drilling / Balance Dynamic Sitting Balance Sitting balance - Comments: Loses balance to the Lt, but pt able to correct today with min A and min verbal cues  Balance Overall balance assessment: Needs assistance Sitting-balance support: Feet  supported Sitting balance-Leahy Scale: Poor Sitting balance - Comments: Loses balance to the Lt, but pt able to correct today with min A and min verbal cues  Postural control: Left lateral lean Standing balance support: Single extremity supported Standing balance-Leahy Scale: Poor Standing balance comment: mod A to maintain standing     Special needs/care consideration BiPAP/CPAP  N/a CPM  N/a Continuous Drip IV  N/a Dialysis  N/a Life Vest  N/a Oxygen  N/a Special Bed  N/a Trach Size  N/a Wound Vac (area)  N/a Skin intact                            Bowel mgmt: LBM 2/13 continent Bladder mgmt: continent Diabetic mgmt Hgb A1 c 5.6 Interpreter arranged for therapy sessions Two adult children fluent [place]; pt and wife primary language Spanish    Previous Home Environment Living Arrangements: Spouse/significant other, Children  Lives With:  (two chidlren and son and daughter adults) Available Help at Discharge: Family, Available 24 hours/day Type of Home: House Home Layout:  (basement and one level) Home Access: Level entry Bathroom Shower/Tub: Health visitor: Standard Bathroom Accessibility: Yes How Accessible: Accessible via walker Home Care Services: No  Discharge Living Setting Plans for Discharge Living Setting: Patient's home, Lives with (comment) (spouse and family) Type of Home at Discharge: House Discharge Home Layout: Able to live on main level with bedroom/bathroom Discharge Home Access: Level entry Discharge Bathroom Shower/Tub: Walk-in shower Discharge Bathroom Toilet: Standard Discharge Bathroom Accessibility: Yes How Accessible: Accessible via walker Does the patient have any problems obtaining your medications?: Yes (Describe) (no insurance)  Social/Family/Support Systems Patient Roles: Spouse, Parent, Other (Comment) (employee) Anticipated Caregiver: wife and family Anticipated Caregiver's Contact Information: see  above Caregiver Availability: 24/7 Discharge Plan Discussed with Primary Caregiver: Yes Is Caregiver In Agreement with Plan?: Yes Does Caregiver/Family have Issues with Lodging/Transportation while Pt is in Rehab?: No (wife satys with pt in hospital)   Goals/Additional Needs Patient/Family Goal for Rehab: supervision to min with PT, supervsiion to Mod with OT, supervision with SLP Expected length of stay: ELOS 20-26 days Cultural Considerations: Hispanic; interpreter needed, daughter speaks [place]'  Special Service Needs: interpreter needed Pt/Family Agrees to Admission and willing to participate: Yes Program Orientation Provided & Reviewed with Pt/Caregiver Including Roles  & Responsibilities: Yes  Decrease burden of Care through IP rehab admission: n/a  Possible need for SNF placement upon discharge: not anticipated  Patient Condition: This patient's condition remains as documented in the consult dated 03/12/2016, in which the Rehabilitation Physician determined and documented that the patient's condition is appropriate for intensive rehabilitative care in an inpatient rehabilitation facility. Will admit to inpatient rehab Saturday 03/14/16  when bed is available.  Preadmission Screen Completed By:  Clois Dupes, 03/13/2016 3:10 PM ______________________________________________________________________   Discussed status with Dr. Riley Kill on 03/13/16 at  1512 and received telephone approval for admission on Saturday 03/14/16 when bed is available.  Admission Coordinator:  Clois Dupes, time 6213 Date 03/14/2015       Cosigned by: Ranelle Oyster, MD at 03/13/2016 3:36 PM  Revision History

## 2016-03-14 NOTE — Progress Notes (Signed)
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 right handed Spanish speaking male on no prescription medications with history of tobacco abuse. Per chart review patient independent prior to admission living with family. 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 Presented to [facility] with left-sided weakness. Patient was transferred to [facility] for further evaluation. 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  Patient did not receive TPA. Neuro interventional radiology consulted underwent thrombectomy. Echocardiogram with ejection fraction of 55% no wall motion abnormalities. Neurology consulted currently on aspirin for CVA prophylaxis and workup ongoing. Subcutaneous Lovenox for DVT prophylaxis. Dysphagia #1 thin liquid diet. Physical therapy evaluation completed 03/12/2016 with recommendations of physical medicine rehabilitation consult.   Review of Systems  Unable to perform ROS: Language   No past medical history on file.      Past Surgical History:  Procedure Laterality Date  . IR GENERIC HISTORICAL  03/11/2016   IR ANGIO VERTEBRAL SEL VERTEBRAL UNI R MOD SED 03/11/2016 [name], DO [facility]-INTER[facility] RAD  . IR GENERIC HISTORICAL  03/11/2016   IR ANGIO INTRA EXTRACRAN SEL COM CAROTID INNOMINATE UNI L MOD SED 03/11/2016 [name], DO [facility]  . IR GENERIC  HISTORICAL  03/11/2016   IR PERCUTANEOUS ART THROMBECTOMY/INFUSION INTRACRANIAL INC DIAG ANGIO 03/11/2016 [name], DO [facility]  . IR GENERIC HISTORICAL  03/11/2016   IR US GUIDE VASC ACCESS RIGHT 03/11/2016 [name], DO [facility]  . RADIOLOGY WITH ANESTHESIA N/A 03/11/2016   Procedure: RADIOLOGY WITH ANESTHESIA;  Surgeon: Medication Radiologist, MD;  Location: [facility] OR;  Service: Radiology;  Laterality: N/A;   No family history on file. Social History:  reports that he has been smoking Cigarettes.  He has a 60.00 pack-year smoking history. He does not have any smokeless tobacco history on file. His alcohol and drug histories are not on file. Allergies: No Known Allergies No prescriptions prior to admission.    Home: Home Living Family/patient expects to be discharged to:: Unsure Living Arrangements: Spouse/significant other, Children Available Help at Discharge: Family, Available 24 hours/day Type of Home: House Home Access: Level entry Home Layout: Able to live on main level with bedroom/bathroom Home Equipment: None  Functional History: Prior Function Level of Independence: Independent Comments: Pt works as Administrator. Functional Status:  Mobility: Bed Mobility Overal bed mobility: Needs Assistance Bed Mobility: Supine to Sit Supine to sit: Min assist, HOB elevated General bed mobility comments: Pt able to activate core and move BLE towards EOB, required minA for LUE and pelvic pad used for scooting. Slow and effortful to move L side.  Transfers Overall transfer level: Needs assistance Transfers: Stand Pivot Transfers, Sit to/from Stand Sit to Stand: Mod assist, +2 physical assistance Stand pivot transfers: +2 physical assistance, Mod assist General transfer comment: ModA +2 for sit-to-stand and stand pivot transfer; increased L knee instability with standing and L lateral lean  ADL:  Cognition: Cognition Overall Cognitive  Status: Impaired/Different  from baseline Orientation Level: Oriented to person, Oriented to place, Oriented to time, Disoriented to situation Cognition Arousal/Alertness: Lethargic Overall Cognitive Status: Impaired/Different from baseline Area of Impairment: Orientation, Following commands, Attention, Awareness Orientation Level: Disoriented to, Place Current Attention Level: Focused (Progressing towards sustained) Following Commands: Follows one step commands consistently, Follows multi-step commands with increased time Awareness: Intellectual (Progressing to emergent) General Comments: Pt A&Ox3, able to recall some aspects of home set-up which was clarified by daughter. Daughter states his speech is slower than usual.   Blood pressure 119/86, pulse 62, temperature 98.9 F (37.2 C), temperature source Oral, resp. rate (!) 21, height 5\' 6"  (1.676 m), weight 74.7 kg (164 lb 10.9 oz), SpO2 98 %. Physical Exam  Constitutional: He appears well-developed.  Eyes:  Pupils reactive to light  Neck: Normal range of motion. Neck supple. No thyromegaly present.  Cardiovascular: Normal rate and regular rhythm.   Respiratory: Effort normal and breath sounds normal. No respiratory distress.  GI: Soft. Bowel sounds are normal. He exhibits no distension.  Neurological:   Patient primarily Spanish-speaking. He did follow some simple verbal as well as motor commands. RUE and RLE motor 5/5. LUE and LLE grossly 0/5, early flexor tone RUE. Decreased sensation to LT/but is able to sense some pain. Drooling from left side of mouth during drinking--decrease oral/facial sensation on left and mild left central 7. Cognitively appears to have reasonable insight and awareness  Psychiatric: He has a normal mood and affect. His behavior is normal.    Lab Results Last 24 Hours       Results for orders placed or performed during the hospital encounter of 03/11/16 (from the past 24 hour(s))  Basic metabolic panel     Status: Abnormal    Collection Time: 03/11/16  4:23 PM  Result Value Ref Range   Sodium 141 135 - 145 mmol/L   Potassium 3.8 3.5 - 5.1 mmol/L   Chloride 109 101 - 111 mmol/L   CO2 23 22 - 32 mmol/L   Glucose, Bld 151 (H) 65 - 99 mg/dL   BUN 9 6 - 20 mg/dL   Creatinine, Ser 3.24 0.61 - 1.24 mg/dL   Calcium 8.8 (L) 8.9 - 10.3 mg/dL   GFR calc non Af Amer >60 >60 mL/min   GFR calc Af Amer >60 >60 mL/min   Anion gap 9 5 - 15  Lipid panel     Status: Abnormal   Collection Time: 03/12/16  5:00 AM  Result Value Ref Range   Cholesterol 190 0 - 200 mg/dL   Triglycerides 401 <027 mg/dL   HDL 32 (L) >25 mg/dL   Total CHOL/HDL Ratio 5.9 RATIO   VLDL 24 0 - 40 mg/dL   LDL Cholesterol 366 (H) 0 - 99 mg/dL  CBC     Status: Abnormal   Collection Time: 03/12/16  5:00 AM  Result Value Ref Range   WBC 17.5 (H) 4.0 - 10.5 K/uL   RBC 4.56 4.22 - 5.81 MIL/uL   Hemoglobin 12.8 (L) 13.0 - 17.0 g/dL   HCT 44.0 (L) 34.7 - 42.5 %   MCV 84.9 78.0 - 100.0 fL   MCH 28.1 26.0 - 34.0 pg   MCHC 33.1 30.0 - 36.0 g/dL   RDW 95.6 38.7 - 56.4 %   Platelets 146 (L) 150 - 400 K/uL  Magnesium     Status: None   Collection Time: 03/12/16  5:00 AM  Result Value Ref Range   Magnesium 2.0  1.7 - 2.4 mg/dL  Phosphorus     Status: None   Collection Time: 03/12/16  5:00 AM  Result Value Ref Range   Phosphorus 3.6 2.5 - 4.6 mg/dL  Basic metabolic panel     Status: Abnormal   Collection Time: 03/12/16  5:00 AM  Result Value Ref Range   Sodium 139 135 - 145 mmol/L   Potassium 3.6 3.5 - 5.1 mmol/L   Chloride 105 101 - 111 mmol/L   CO2 26 22 - 32 mmol/L   Glucose, Bld 109 (H) 65 - 99 mg/dL   BUN 12 6 - 20 mg/dL   Creatinine, Ser 9.14 0.61 - 1.24 mg/dL   Calcium 8.9 8.9 - 78.2 mg/dL   GFR calc non Af Amer >60 >60 mL/min   GFR calc Af Amer >60 >60 mL/min   Anion gap 8 5 - 15      Imaging Results (Last 48 hours)  Ct Head Wo Contrast  Result Date: 03/11/2016 CLINICAL DATA:  Status post  mechanical thrombectomy. EXAM: CT HEAD WITHOUT CONTRAST TECHNIQUE: Contiguous axial images were obtained from the base of the skull through the vertex without intravenous contrast. COMPARISON:  Head CT from earlier today FINDINGS: Brain: Stable perforator type infarct in the right basal ganglia and internal capsule. No evidence of infarct progression. No acute hemorrhage. No hydrocephalus or mass. Vascular: Reason intravenous contrast. No detectable asymmetric enhancement. Skull: Remote craniotomy on the right for hematoma evacuation. Sinuses/Orbits: Nasopharyngeal fluid in the setting of intubation. IMPRESSION: Stable postprocedural CT. No progression of the lenticulostriate infarct and no acute hemorrhage. Electronically Signed   By: Marnee Spring M.D.   On: 03/11/2016 12:41   Mr Brain Wo Contrast  Result Date: 03/12/2016 CLINICAL DATA:  Known RIGHT M1 occlusion, follow-up stroke. EXAM: MRI HEAD WITHOUT CONTRAST MRA HEAD WITHOUT CONTRAST TECHNIQUE: Multiplanar, multiecho pulse sequences of the brain and surrounding structures were obtained without intravenous contrast. Angiographic images of the head were obtained using MRA technique without contrast. Patient was unable to tolerate further imaging, coronal T2 not seen. COMPARISON:  CT HEAD March 11, 2016 FINDINGS: MRI HEAD FINDINGS- mildly motion degraded examination. BRAIN: Patchy reduced diffusion RIGHT basal ganglia, RIGHT frontotemporal parietal lobes with corresponding low ADC values faint clear T2 hyperintense signal. No susceptibility artifact to suggest hemorrhage. No susceptibility artifact to suggest hemorrhage. The ventricles and sulci are normal for patient's age. No suspicious parenchymal signal, masses or mass effect. No abnormal extra-axial fluid collections. VASCULAR: Normal major intracranial vascular flow voids present at skull base. SKULL AND UPPER CERVICAL SPINE: No abnormal sellar expansion. No suspicious calvarial bone marrow  signal. Craniocervical junction maintained. SINUSES/ORBITS: The mastoid air-cells and included paranasal sinuses are well-aerated. The included ocular globes and orbital contents are non-suspicious. OTHER: None. MRA HEAD FINDINGS- moderately motion degraded ANTERIOR CIRCULATION: Flow related enhancement bilateral internal carotid arteries, apparent mild narrowing RIGHT cavernous internal carotid artery, the vessel is patent on prior CT and this is attributable to calcific atherosclerosis. Chronically occluded proximal RIGHT M1 segments, no collateral vessels identified on time-of-flight MRA. LEFT middle cerebral artery and bilateral anterior cerebral arteries are patent. POSTERIOR CIRCULATION: Bilateral vertebral arteries, basilar artery are patent. Limited assessment of the main branch vessels due to patient motion. Patent bilateral posterior cerebral arteries. Small RIGHT posterior communicating artery present as seen on prior CTA. ANATOMIC VARIANTS: None. 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 No hemorrhage. MRA HEAD: Moderately motion degraded examination. RIGHT M1 occlusion, no collateral vessels by time-of-flight  technique. Electronically Signed   By: Awilda Metro M.D.   On: 03/12/2016 05:07   Ir US Guide Vasc Access Right  Result Date: 03/11/2016 INDICATION: 51 year old male presenting with acute left-sided symptoms and imaging evidence of right MCA emergent large vessel occlusion. CT imaging demonstrates normal aspects score, with CT perfusion shadowing elevated mean transit time with potential tissue at risk and no core infarct. EXAM: IR PERCUTANEOUS ART THORMBECTOMY/INFUSION INTRACRANIAL INCLUDE DIAG ANGIO; IR ANGIO VERTEBRAL SEL VERTEBRAL UNI RIGHT MOD SED; IR ANGIO INTRA EXTRACRAN SEL COM CAROTID INNOMINATE UNI LEFT MOD SED; IR ULTRASOUND GUIDANCE VASC ACCESS RIGHT COMPARISON:  No prior CT angiogram MEDICATIONS: 2.0 g Ancef. The antibiotic was administered  within 1 hour of the procedure ANESTHESIA/SEDATION: Anesthesia team provided general anesthesia CONTRAST:  160 cc Isovue-300 FLUOROSCOPY TIME:  Fluoroscopy Time: 24 minutes 36 seconds (533 mGy). COMPLICATIONS: None TECHNIQUE: Informed written consent was obtained from the patient and the patient's family after a thorough discussion of the procedural risks, benefits and alternatives. Specific risks discussed include: Bleeding, infection, contrast reaction, kidney injury/failure, need for further procedure/surgery, arterial injury or dissection, embolization to new territory, intracranial hemorrhage (10-15% risk), neurologic deterioration, cardiopulmonary collapse, death. All questions were addressed. Maximal Sterile Barrier Technique was utilized including during the procedure including caps, mask, sterile gowns, sterile gloves, sterile drape, hand hygiene and skin antiseptic. A timeout was performed prior to the initiation of the procedure. Ultrasound survey of the right inguinal region was performed with images stored and sent to PACs. 11 blade scalpel was used to make a small incision. A micropuncture needle was used access the right common femoral artery under ultrasound. With excellent arterial blood flow returned, an .018 micro wire was passed through the needle, observed to enter the abdominal aorta under fluoroscopy. The needle was removed, and a micropuncture sheath was placed over the wire. The inner dilator and wire were removed, and an 035 Bentson wire was advanced under fluoroscopy into the abdominal aorta. The sheath was removed and a standard 5 [place] vascular sheath was placed. The dilator was removed and the sheath was flushed. A 11F JB-1 diagnostic catheter was advanced over the wire to the proximal descending thoracic aorta. Wire was then removed. Double flush of the catheter was performed. Catheter was then used to select the innominate artery. Angiogram was performed. Using roadmap technique, the  catheter was advanced over a roadrunner wire into cervical ICA. Formal angiogram was performed. Exchange length Rosen wire was then passed through the diagnostic catheter to the distal cervical ICA and the diagnostic catheter was removed. The 5 French sheath was removed and exchanged for 8 French 55 centimeter BrightTip sheath. Sheath was flushed and attached to pressurized and heparinized saline bag for constant forward flow. Then a neuron Max 85cm catheter was prepared on the back table. Ace 68 intermediate catheter was then loaded though the Neuron Max catheter and advanced over the [facility] wire to the internal carotid artery. Wire was removed, and roadmap angiogram was performed. Microcatheter system was then introduced through the Ace catheter using a synchro soft 014 wire and a Trevo Provue18 catheter. Microcatheter system was advanced into the internal carotid artery, to the level of the occlusion. The micro wire was then carefully advanced through the occluded segment. Microcatheter was then push through the occluded segment and the wire was removed. Ace catheter was advanced over the micro wire to the ophthalmic ICA segment. Blood was then aspirated through the hub of the microcatheter, and a gentle contrast injection  was performed confirming intraluminal position. A rotating hemostatic valve was then attached to the back end of the microcatheter, and a pressurized and heparinized saline bag was attached to the catheter. 4 x 40 solitaire device was then selected. Back flush was achieved at the rotating hemostatic valve, and then the device was gently advanced through the microcatheter to the distal end. The retriever was then unsheathed by withdrawing the microcatheter under fluoroscopy. Once the retriever was completely unsheathed, control angiogram was performed from the balloon catheter. Constant aspiration was then performed through the Ace catheter as the retriever was gently and slowly withdrawn with  fluoroscopic observation. Once the retriever was entirely removed from the system, free aspiration was confirmed at the hub of the intermediate catheter, with free blood return confirmed. Control angiogram was then performed. Persistent occlusion at the proximal MCA was confirmed. The microcatheter system was then advanced through the Ace catheter. On the 2nd attempt, a lateral view was required in order to guide the microcatheter system beyond the ophthalmic artery origin. Once the micro wire microcatheter were beyond the occlusion, the micro wire was removed and solitaire 4 x 40 device was deployed. After the solitaire was deployed across the occlusion, the Ace catheter was advanced to the M1 segment at the site of the occlusion. Local aspiration was performed upon withdrawal of the solitaire device under fluoroscopic observation. Once the retrieve her was entirely removed from the system, free aspiration was confirmed at the hub of the intermediate catheter, with free blood return confirmed. Control angiogram was again performed. Review of the available imaging was performed at this time. The intermediate catheter was removed and Neuro Max catheter were removed. Diagnostic imaging was performed at the right vert origin. Angiogram of the left carotid system was also performed including intracranial images. Catheter was removed, and the bright tip sheath was exchanged for a short 8 French sheath at the right common femoral artery. Control angiogram was performed at the right common femoral artery puncture site. After the angiogram, right common femoral sheath was removed with deployment of Exoseal device. Patient tolerated the procedure well and remained hemodynamically stable throughout. No complications were encountered. Estimated blood loss approximately 50 cc. FINDINGS: Baseline angiogram Right common carotid artery:  Normal course caliber and contour. Right external carotid artery: Patent with antegrade flow.  Significant flow through the superficial temporal branch, potentially contributing to collateral flow intracranial. Right internal carotid artery: Normal course caliber and contour of the cervical portion. Vertical and petrous segment patent with normal course caliber contour. Cavernous segment patent. Clinoid segment patent. Antegrade flow of the ophthalmic artery. Ophthalmic segment patent. Terminus patent. Right MCA: Proximal occlusion of the middle cerebral artery. There is excellent collateral flow to the right hemisphere VA anterior cerebral artery. Right ACA: A 1 segment patent. A 2 segment perfuses the right territory. Baseline perfusion TICI 1, with excellent collateral perfusion. After the 1st pass, there is no significant improvement through the occlusion, with persistent TICI 1. After the 2nd pass, there is very minimal improvement in flow through the occluded segment, with persisting TICI 1 flow, and excellent collateral flow maintained. Vertebral origin injection demonstrates normal course contour and caliber of the right vertebral artery, with patent V3 segment. Cross flow from the left vertebral artery with partial washout in the basilar artery. Patent posterior inferior cerebellar artery, anterior inferior cerebellar artery, and superior cerebral artery. Left carotid angiogram demonstrates normal course caliber and contour of the left CCA and cervical ICA. Patent external carotid artery.  Suggestion of mild atherosclerotic changes at the origin of the A1 segment, with patent 82 segment. Patent middle cerebral artery with typical appearance of the arterial, late arterial, capillary and venous phase. IMPRESSION: Status post cerebral angiogram with attempted mechanical thrombectomy of right MCA occlusion. After 2 passes with local aspiration and stentreiver technology, there is persistent occlusion and TICI 1 flow. Excellent collateral flow perfusing the right MCA territory. My impression is that there  is likely underlying stenosis at the site of occlusion given the collateral flow, and that further manipulation may cause dissection or hemorrhage. Given the excellent collateral flow and absence of core infarction on the perfusion imaging, we elected to defer further attempts at thrombectomy. Deployment of Exoseal for hemostasis. Signed, Yvone Neu. Loreta Ave, DO [facility] Radiology Electronically Signed   By: [name] D.O.   On: 03/11/2016 13:10   Ct Cerebral Perfusion W Contrast  Result Date: 03/11/2016 CLINICAL DATA:  51 year old male with right MCA M1 occlusion on CTA and only small lacunar type infarct in the right lentiform evident by CT since 05/1936 4 hours today. Initial encounter. EXAM: CT PERFUSION BRAIN TECHNIQUE: Multiphase CT imaging of the brain was performed following IV bolus contrast injection. Subsequent parametric perfusion maps were calculated using RAPID software. CONTRAST:  50 mL Isovue 370 COMPARISON:  CTA head and neck performed at 0640 hours today. Head CTs earlier today. FINDINGS: CT Brain Perfusion Findings: CBF (<30%) Volume:  0 mL Perfusion (Tmax>6.0s) volume: 118 mL mL, corresponding to the right MCA territory Mismatch Volume:  " Infinite" Infarction Location:Noncontrast head CT suggests there is a small core infarct in the right lentiform nuclei on this patient, but this is not detected using the perfusion thresholds for this exam. IMPRESSION: Large area of right MCA territory penumbra. Clinically suspected small volume core infarct in the right lentiform not detected. Overall very favorable CTP characteristics for endovascular reperfusion. I am advised that they are prepping for Neurointervention at the time of this dictation. Electronically Signed   By: Odessa Fleming M.D.   On: 03/11/2016 09:27   Portable Chest Xray  Result Date: 03/12/2016 CLINICAL DATA:  Respiratory failure.  Stroke. EXAM: PORTABLE CHEST 1 VIEW COMPARISON:   03/11/2016. FINDINGS: Interim extubation. Mild narrowing of the upper trachea noted. Trachea stenosis or paratracheal process cannot be excluded. Mediastinum hilar structures normal. Cardiomegaly. Mild pulmonary venous congestion. No focal infiltrate. No pleural effusion or pneumothorax. No acute bony abnormality . IMPRESSION: 1. Interim extubation. Mild narrowing of the upper trachea noted. Tracheal stenosis or peritracheal process cannot be excluded . 2. Cardiomegaly with mild pulmonary venous congestion. No overt pulmonary edema. Electronically Signed   By: Maisie Fus  Register   On: 03/12/2016 07:25   Portable Chest Xray  Result Date: 03/11/2016 CLINICAL DATA:  Hypoxia EXAM: PORTABLE CHEST 1 VIEW COMPARISON:  None. FINDINGS: Endotracheal tube tip is 2.9 cm above the carina. No pneumothorax. There is no edema or consolidation. Heart size and pulmonary vascularity are normal. No adenopathy. No bone lesions. IMPRESSION: Endotracheal tube as described without pneumothorax. No edema or consolidation. Electronically Signed   By: Bretta Bang III M.D.   On: 03/11/2016 15:56   Mr Maxine Glenn Head/brain ZO Cm  Result Date: 03/12/2016 CLINICAL DATA:  Known RIGHT M1 occlusion, follow-up stroke. EXAM: MRI HEAD WITHOUT CONTRAST MRA HEAD WITHOUT CONTRAST TECHNIQUE: Multiplanar, multiecho pulse sequences of the brain and surrounding structures were obtained without intravenous contrast. Angiographic images of the head were obtained using MRA technique without  contrast. Patient was unable to tolerate further imaging, coronal T2 not seen. COMPARISON:  CT HEAD March 11, 2016 FINDINGS: MRI HEAD FINDINGS- mildly motion degraded examination. BRAIN: Patchy reduced diffusion RIGHT basal ganglia, RIGHT frontotemporal parietal lobes with corresponding low ADC values faint clear T2 hyperintense signal. No susceptibility artifact to suggest hemorrhage. No susceptibility artifact to suggest hemorrhage. The ventricles and sulci are  normal for patient's age. No suspicious parenchymal signal, masses or mass effect. No abnormal extra-axial fluid collections. VASCULAR: Normal major intracranial vascular flow voids present at skull base. SKULL AND UPPER CERVICAL SPINE: No abnormal sellar expansion. No suspicious calvarial bone marrow signal. Craniocervical junction maintained. SINUSES/ORBITS: The mastoid air-cells and included paranasal sinuses are well-aerated. The included ocular globes and orbital contents are non-suspicious. OTHER: None. MRA HEAD FINDINGS- moderately motion degraded ANTERIOR CIRCULATION: Flow related enhancement bilateral internal carotid arteries, apparent mild narrowing RIGHT cavernous internal carotid artery, the vessel is patent on prior CT and this is attributable to calcific atherosclerosis. Chronically occluded proximal RIGHT M1 segments, no collateral vessels identified on time-of-flight MRA. LEFT middle cerebral artery and bilateral anterior cerebral arteries are patent. POSTERIOR CIRCULATION: Bilateral vertebral arteries, basilar artery are patent. Limited assessment of the main branch vessels due to patient motion. Patent bilateral posterior cerebral arteries. Small RIGHT posterior communicating artery present as seen on prior CTA. ANATOMIC VARIANTS: None. 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 No hemorrhage. MRA HEAD: Moderately motion degraded examination. RIGHT M1 occlusion, no collateral vessels by time-of-flight technique. Electronically Signed   By: Awilda Metro M.D.   On: 03/12/2016 05:07   Ir Percutaneous Art Thrombectomy/infusion Intracranial Inc Diag Angio  Result Date: 03/11/2016 INDICATION: 51 year old male presenting with acute left-sided symptoms and imaging evidence of right MCA emergent large vessel occlusion. CT imaging demonstrates normal aspects score, with CT perfusion shadowing elevated mean transit time with potential tissue at risk and no core  infarct. EXAM: IR PERCUTANEOUS ART THORMBECTOMY/INFUSION INTRACRANIAL INCLUDE DIAG ANGIO; IR ANGIO VERTEBRAL SEL VERTEBRAL UNI RIGHT MOD SED; IR ANGIO INTRA EXTRACRAN SEL COM CAROTID INNOMINATE UNI LEFT MOD SED; IR ULTRASOUND GUIDANCE VASC ACCESS RIGHT COMPARISON:  No prior CT angiogram MEDICATIONS: 2.0 g Ancef. The antibiotic was administered within 1 hour of the procedure ANESTHESIA/SEDATION: Anesthesia team provided general anesthesia CONTRAST:  160 cc Isovue-300 FLUOROSCOPY TIME:  Fluoroscopy Time: 24 minutes 36 seconds (533 mGy). COMPLICATIONS: None TECHNIQUE: Informed written consent was obtained from the patient and the patient's family after a thorough discussion of the procedural risks, benefits and alternatives. Specific risks discussed include: Bleeding, infection, contrast reaction, kidney injury/failure, need for further procedure/surgery, arterial injury or dissection, embolization to new territory, intracranial hemorrhage (10-15% risk), neurologic deterioration, cardiopulmonary collapse, death. All questions were addressed. Maximal Sterile Barrier Technique was utilized including during the procedure including caps, mask, sterile gowns, sterile gloves, sterile drape, hand hygiene and skin antiseptic. A timeout was performed prior to the initiation of the procedure. Ultrasound survey of the right inguinal region was performed with images stored and sent to PACs. 11 blade scalpel was used to make a small incision. A micropuncture needle was used access the right common femoral artery under ultrasound. With excellent arterial blood flow returned, an .018 micro wire was passed through the needle, observed to enter the abdominal aorta under fluoroscopy. The needle was removed, and a micropuncture sheath was placed over the wire. The inner dilator and wire were removed, and an 035 Bentson wire was advanced under fluoroscopy into the abdominal aorta. The  sheath was removed and a standard 5 [place] vascular  sheath was placed. The dilator was removed and the sheath was flushed. A 60F JB-1 diagnostic catheter was advanced over the wire to the proximal descending thoracic aorta. Wire was then removed. Double flush of the catheter was performed. Catheter was then used to select the innominate artery. Angiogram was performed. Using roadmap technique, the catheter was advanced over a roadrunner wire into cervical ICA. Formal angiogram was performed. Exchange length Rosen wire was then passed through the diagnostic catheter to the distal cervical ICA and the diagnostic catheter was removed. The 5 French sheath was removed and exchanged for 8 French 55 centimeter BrightTip sheath. Sheath was flushed and attached to pressurized and heparinized saline bag for constant forward flow. Then a neuron Max 85cm catheter was prepared on the back table. Ace 68 intermediate catheter was then loaded though the Neuron Max catheter and advanced over the [facility] [place] wire to the internal carotid artery. Wire was removed, and roadmap angiogram was performed. Microcatheter system was then introduced through the Ace catheter using a synchro soft 014 wire and a Trevo Provue18 catheter. Microcatheter system was advanced into the internal carotid artery, to the level of the occlusion. The micro wire was then carefully advanced through the occluded segment. Microcatheter was then push through the occluded segment and the wire was removed. Ace catheter was advanced over the micro wire to the ophthalmic ICA segment. Blood was then aspirated through the hub of the microcatheter, and a gentle contrast injection was performed confirming intraluminal position. A rotating hemostatic valve was then attached to the back end of the microcatheter, and a pressurized and heparinized saline bag was attached to the catheter. 4 x 40 solitaire device was then selected. Back flush was achieved at the rotating hemostatic valve, and then the device was gently advanced through  the microcatheter to the distal end. The retriever was then unsheathed by withdrawing the microcatheter under fluoroscopy. Once the retriever was completely unsheathed, control angiogram was performed from the balloon catheter. Constant aspiration was then performed through the Ace catheter as the retriever was gently and slowly withdrawn with fluoroscopic observation. Once the retriever was entirely removed from the system, free aspiration was confirmed at the hub of the intermediate catheter, with free blood return confirmed. Control angiogram was then performed. Persistent occlusion at the proximal MCA was confirmed. The microcatheter system was then advanced through the Ace catheter. On the 2nd attempt, a lateral view was required in order to guide the microcatheter system beyond the ophthalmic artery origin. Once the micro wire microcatheter were beyond the occlusion, the micro wire was removed and solitaire 4 x 40 device was deployed. After the solitaire was deployed across the occlusion, the Ace catheter was advanced to the M1 segment at the site of the occlusion. Local aspiration was performed upon withdrawal of the solitaire device under fluoroscopic observation. Once the retrieve her was entirely removed from the system, free aspiration was confirmed at the hub of the intermediate catheter, with free blood return confirmed. Control angiogram was again performed. Review of the available imaging was performed at this time. The intermediate catheter was removed and Neuro Max catheter were removed. Diagnostic imaging was performed at the right vert origin. Angiogram of the left carotid system was also performed including intracranial images. Catheter was removed, and the bright tip sheath was exchanged for a short 8 French sheath at the right common femoral artery. Control angiogram was performed at the right common femoral  artery puncture site. After the angiogram, right common femoral sheath was removed with  deployment of Exoseal device. Patient tolerated the procedure well and remained hemodynamically stable throughout. No complications were encountered. Estimated blood loss approximately 50 cc. FINDINGS: Baseline angiogram Right common carotid artery:  Normal course caliber and contour. Right external carotid artery: Patent with antegrade flow. Significant flow through the superficial temporal branch, potentially contributing to collateral flow intracranial. Right internal carotid artery: Normal course caliber and contour of the cervical portion. Vertical and petrous segment patent with normal course caliber contour. Cavernous segment patent. Clinoid segment patent. Antegrade flow of the ophthalmic artery. Ophthalmic segment patent. Terminus patent. Right MCA: Proximal occlusion of the middle cerebral artery. There is excellent collateral flow to the right hemisphere VA anterior cerebral artery. Right ACA: A 1 segment patent. A 2 segment perfuses the right territory. Baseline perfusion TICI 1, with excellent collateral perfusion. After the 1st pass, there is no significant improvement through the occlusion, with persistent TICI 1. After the 2nd pass, there is very minimal improvement in flow through the occluded segment, with persisting TICI 1 flow, and excellent collateral flow maintained. Vertebral origin injection demonstrates normal course contour and caliber of the right vertebral artery, with patent V3 segment. Cross flow from the left vertebral artery with partial washout in the basilar artery. Patent posterior inferior cerebellar artery, anterior inferior cerebellar artery, and superior cerebral artery. Left carotid angiogram demonstrates normal course caliber and contour of the left CCA and cervical ICA. Patent external carotid artery. Suggestion of mild atherosclerotic changes at the origin of the A1 segment, with patent 82 segment. Patent middle cerebral artery with typical appearance of the arterial, late  arterial, capillary and venous phase. IMPRESSION: Status post cerebral angiogram with attempted mechanical thrombectomy of right MCA occlusion. After 2 passes with local aspiration and stentreiver technology, there is persistent occlusion and TICI 1 flow. Excellent collateral flow perfusing the right MCA territory. My impression is that there is likely underlying stenosis at the site of occlusion given the collateral flow, and that further manipulation may cause dissection or hemorrhage. Given the excellent collateral flow and absence of core infarction on the perfusion imaging, we elected to defer further attempts at thrombectomy. Deployment of Exoseal for hemostasis. Signed, Yvone NeuJaime S. Loreta AveWagner, DO [facility] Electronically Signed   By: Gilmer MorJaime  Wagner D.O.   On: 03/11/2016 13:10   Ct Head Code Stroke Wo Contrast`  Result Date: 03/11/2016 CLINICAL DATA:  Code stroke. 51 year old male with right MCA M1 occlusion on CTA head and neck for evaluation of 2 days of left side weakness. Initial encounter. EXAM: CT HEAD WITHOUT CONTRAST TECHNIQUE: Contiguous axial images were obtained from the base of the skull through the vertex without intravenous contrast. COMPARISON:  CTA 0640 hours today. Head CT without contrast 0544 hours today. FINDINGS: Brain: There is stable hypodensity in the right lentiform nuclei, but otherwise no cytotoxic edema is evident in the right MCA territory. Gray-white matter differentiation appears stable No acute intracranial hemorrhage identified. No midline shift, mass effect, or evidence of intracranial mass lesion. No ventriculomegaly. Vascular: Mild residual intravascular contrast. Skull: Previous right frontotemporal craniotomy. No acute osseous abnormality identified. Sinuses/Orbits: Stable. Well pneumatized in general. Chronic left lamina papyracea fracture. Other: No acute orbit or scalp soft tissue findings. ASPECTS [facility]([place]  Stroke Program Early CT Score) - Ganglionic level infarction (caudate, lentiform nuclei, internal capsule, insula, M1-M3 cortex): 6 (minus 1 for L) - Supraganglionic infarction (M4-M6 cortex): 3  Total score (0-10 with 10 being normal): 9 IMPRESSION: 1. Stable noncontrast CT appearance of the brain since 0544 hours today: Hypodensity in the right lentiform nucleus, but no other CT changes of right MCA infarct. No acute intracranial hemorrhage. 2. ASPECTS is 9. 3. I was advised that Dr. Ruthy Dick was discussing the findings on this study with Neurology at 0916 hours. Electronically Signed   By: Odessa Fleming M.D.   On: 03/11/2016 09:22   Ir Angio Intra Extracran Sel Com Carotid Innominate Uni L Mod Sed  Result Date: 03/11/2016 INDICATION: 51 year old male presenting with acute left-sided symptoms and imaging evidence of right MCA emergent large vessel occlusion. CT imaging demonstrates normal aspects score, with CT perfusion shadowing elevated mean transit time with potential tissue at risk and no core infarct. EXAM: IR PERCUTANEOUS ART THORMBECTOMY/INFUSION INTRACRANIAL INCLUDE DIAG ANGIO; IR ANGIO VERTEBRAL SEL VERTEBRAL UNI RIGHT MOD SED; IR ANGIO INTRA EXTRACRAN SEL COM CAROTID INNOMINATE UNI LEFT MOD SED; IR ULTRASOUND GUIDANCE VASC ACCESS RIGHT COMPARISON:  No prior CT angiogram MEDICATIONS: 2.0 g Ancef. The antibiotic was administered within 1 hour of the procedure ANESTHESIA/SEDATION: Anesthesia team provided general anesthesia CONTRAST:  160 cc Isovue-300 FLUOROSCOPY TIME:  Fluoroscopy Time: 24 minutes 36 seconds (533 mGy). COMPLICATIONS: None TECHNIQUE: Informed written consent was obtained from the patient and the patient's family after a thorough discussion of the procedural risks, benefits and alternatives. Specific risks discussed include: Bleeding, infection, contrast reaction, kidney injury/failure, need for further procedure/surgery, arterial injury or dissection, embolization to new territory,  intracranial hemorrhage (10-15% risk), neurologic deterioration, cardiopulmonary collapse, death. All questions were addressed. Maximal Sterile Barrier Technique was utilized including during the procedure including caps, mask, sterile gowns, sterile gloves, sterile drape, hand hygiene and skin antiseptic. A timeout was performed prior to the initiation of the procedure. Ultrasound survey of the right inguinal region was performed with images stored and sent to PACs. 11 blade scalpel was used to make a small incision. A micropuncture needle was used access the right common femoral artery under ultrasound. With excellent arterial blood flow returned, an .018 micro wire was passed through the needle, observed to enter the abdominal aorta under fluoroscopy. The needle was removed, and a micropuncture sheath was placed over the wire. The inner dilator and wire were removed, and an 035 Bentson wire was advanced under fluoroscopy into the abdominal aorta. The sheath was removed and a standard 5 [place] vascular sheath was placed. The dilator was removed and the sheath was flushed. A 35F JB-1 diagnostic catheter was advanced over the wire to the proximal descending thoracic aorta. Wire was then removed. Double flush of the catheter was performed. Catheter was then used to select the innominate artery. Angiogram was performed. Using roadmap technique, the catheter was advanced over a roadrunner wire into cervical ICA. Formal angiogram was performed. Exchange length Rosen wire was then passed through the diagnostic catheter to the distal cervical ICA and the diagnostic catheter was removed. The 5 French sheath was removed and exchanged for 8 French 55 centimeter BrightTip sheath. Sheath was flushed and attached to pressurized and heparinized saline bag for constant forward flow. Then a neuron Max 85cm catheter was prepared on the back table. Ace 68 intermediate catheter was then loaded though the Neuron Max catheter and  advanced over the [facility] wire to the internal carotid artery. Wire was removed, and roadmap angiogram was performed. Microcatheter system was then introduced through the Ace catheter using a synchro soft 014 wire and a Trevo  Provue18 catheter. Microcatheter system was advanced into the internal carotid artery, to the level of the occlusion. The micro wire was then carefully advanced through the occluded segment. Microcatheter was then push through the occluded segment and the wire was removed. Ace catheter was advanced over the micro wire to the ophthalmic ICA segment. Blood was then aspirated through the hub of the microcatheter, and a gentle contrast injection was performed confirming intraluminal position. A rotating hemostatic valve was then attached to the back end of the microcatheter, and a pressurized and heparinized saline bag was attached to the catheter. 4 x 40 solitaire device was then selected. Back flush was achieved at the rotating hemostatic valve, and then the device was gently advanced through the microcatheter to the distal end. The retriever was then unsheathed by withdrawing the microcatheter under fluoroscopy. Once the retriever was completely unsheathed, control angiogram was performed from the balloon catheter. Constant aspiration was then performed through the Ace catheter as the retriever was gently and slowly withdrawn with fluoroscopic observation. Once the retriever was entirely removed from the system, free aspiration was confirmed at the hub of the intermediate catheter, with free blood return confirmed. Control angiogram was then performed. Persistent occlusion at the proximal MCA was confirmed. The microcatheter system was then advanced through the Ace catheter. On the 2nd attempt, a lateral view was required in order to guide the microcatheter system beyond the ophthalmic artery origin. Once the micro wire microcatheter were beyond the occlusion, the micro wire was removed and  solitaire 4 x 40 device was deployed. After the solitaire was deployed across the occlusion, the Ace catheter was advanced to the M1 segment at the site of the occlusion. Local aspiration was performed upon withdrawal of the solitaire device under fluoroscopic observation. Once the retrieve her was entirely removed from the system, free aspiration was confirmed at the hub of the intermediate catheter, with free blood return confirmed. Control angiogram was again performed. Review of the available imaging was performed at this time. The intermediate catheter was removed and Neuro Max catheter were removed. Diagnostic imaging was performed at the right vert origin. Angiogram of the left carotid system was also performed including intracranial images. Catheter was removed, and the bright tip sheath was exchanged for a short 8 French sheath at the right common femoral artery. Control angiogram was performed at the right common femoral artery puncture site. After the angiogram, right common femoral sheath was removed with deployment of Exoseal device. Patient tolerated the procedure well and remained hemodynamically stable throughout. No complications were encountered. Estimated blood loss approximately 50 cc. FINDINGS: Baseline angiogram Right common carotid artery:  Normal course caliber and contour. Right external carotid artery: Patent with antegrade flow. Significant flow through the superficial temporal branch, potentially contributing to collateral flow intracranial. Right internal carotid artery: Normal course caliber and contour of the cervical portion. Vertical and petrous segment patent with normal course caliber contour. Cavernous segment patent. Clinoid segment patent. Antegrade flow of the ophthalmic artery. Ophthalmic segment patent. Terminus patent. Right MCA: Proximal occlusion of the middle cerebral artery. There is excellent collateral flow to the right hemisphere VA anterior cerebral artery. Right  ACA: A 1 segment patent. A 2 segment perfuses the right territory. Baseline perfusion TICI 1, with excellent collateral perfusion. After the 1st pass, there is no significant improvement through the occlusion, with persistent TICI 1. After the 2nd pass, there is very minimal improvement in flow through the occluded segment, with persisting TICI 1 flow, and  excellent collateral flow maintained. Vertebral origin injection demonstrates normal course contour and caliber of the right vertebral artery, with patent V3 segment. Cross flow from the left vertebral artery with partial washout in the basilar artery. Patent posterior inferior cerebellar artery, anterior inferior cerebellar artery, and superior cerebral artery. Left carotid angiogram demonstrates normal course caliber and contour of the left CCA and cervical ICA. Patent external carotid artery. Suggestion of mild atherosclerotic changes at the origin of the A1 segment, with patent 82 segment. Patent middle cerebral artery with typical appearance of the arterial, late arterial, capillary and venous phase. IMPRESSION: Status post cerebral angiogram with attempted mechanical thrombectomy of right MCA occlusion. After 2 passes with local aspiration and stentreiver technology, there is persistent occlusion and TICI 1 flow. Excellent collateral flow perfusing the right MCA territory. My impression is that there is likely underlying stenosis at the site of occlusion given the collateral flow, and that further manipulation may cause dissection or hemorrhage. Given the excellent collateral flow and absence of core infarction on the perfusion imaging, we elected to defer further attempts at thrombectomy. Deployment of Exoseal for hemostasis. Signed, Yvone Neu. Loreta Ave, DO [facility] Electronically Signed   By: [name] D.O.   On: 03/11/2016 13:10   Ir Angio Vertebral Sel Vertebral Uni R Mod Sed  Result  Date: 03/11/2016 INDICATION: 51 year old male presenting with acute left-sided symptoms and imaging evidence of right MCA emergent large vessel occlusion. CT imaging demonstrates normal aspects score, with CT perfusion shadowing elevated mean transit time with potential tissue at risk and no core infarct. EXAM: IR PERCUTANEOUS ART THORMBECTOMY/INFUSION INTRACRANIAL INCLUDE DIAG ANGIO; IR ANGIO VERTEBRAL SEL VERTEBRAL UNI RIGHT MOD SED; IR ANGIO INTRA EXTRACRAN SEL COM CAROTID INNOMINATE UNI LEFT MOD SED; IR ULTRASOUND GUIDANCE VASC ACCESS RIGHT COMPARISON:  No prior CT angiogram MEDICATIONS: 2.0 g Ancef. The antibiotic was administered within 1 hour of the procedure ANESTHESIA/SEDATION: Anesthesia team provided general anesthesia CONTRAST:  160 cc Isovue-300 FLUOROSCOPY TIME:  Fluoroscopy Time: 24 minutes 36 seconds (533 mGy). COMPLICATIONS: None TECHNIQUE: Informed written consent was obtained from the patient and the patient's family after a thorough discussion of the procedural risks, benefits and alternatives. Specific risks discussed include: Bleeding, infection, contrast reaction, kidney injury/failure, need for further procedure/surgery, arterial injury or dissection, embolization to new territory, intracranial hemorrhage (10-15% risk), neurologic deterioration, cardiopulmonary collapse, death. All questions were addressed. Maximal Sterile Barrier Technique was utilized including during the procedure including caps, mask, sterile gowns, sterile gloves, sterile drape, hand hygiene and skin antiseptic. A timeout was performed prior to the initiation of the procedure. Ultrasound survey of the right inguinal region was performed with images stored and sent to PACs. 11 blade scalpel was used to make a small incision. A micropuncture needle was used access the right common femoral artery under ultrasound. With excellent arterial blood flow returned, an .018 micro wire was passed through the needle, observed to  enter the abdominal aorta under fluoroscopy. The needle was removed, and a micropuncture sheath was placed over the wire. The inner dilator and wire were removed, and an 035 Bentson wire was advanced under fluoroscopy into the abdominal aorta. The sheath was removed and a standard 5 [place] vascular sheath was placed. The dilator was removed and the sheath was flushed. A 64F JB-1 diagnostic catheter was advanced over the wire to the proximal descending thoracic aorta. Wire was then removed. Double flush of the catheter was performed. Catheter was then used to  select the innominate artery. Angiogram was performed. Using roadmap technique, the catheter was advanced over a roadrunner wire into cervical ICA. Formal angiogram was performed. Exchange length Rosen wire was then passed through the diagnostic catheter to the distal cervical ICA and the diagnostic catheter was removed. The 5 French sheath was removed and exchanged for 8 French 55 centimeter BrightTip sheath. Sheath was flushed and attached to pressurized and heparinized saline bag for constant forward flow. Then a neuron Max 85cm catheter was prepared on the back table. Ace 68 intermediate catheter was then loaded though the Neuron Max catheter and advanced over the [facility] wire to the internal carotid artery. Wire was removed, and roadmap angiogram was performed. Microcatheter system was then introduced through the Ace catheter using a synchro soft 014 wire and a Trevo Provue18 catheter. Microcatheter system was advanced into the internal carotid artery, to the level of the occlusion. The micro wire was then carefully advanced through the occluded segment. Microcatheter was then push through the occluded segment and the wire was removed. Ace catheter was advanced over the micro wire to the ophthalmic ICA segment. Blood was then aspirated through the hub of the microcatheter, and a gentle contrast injection was performed confirming intraluminal position. A  rotating hemostatic valve was then attached to the back end of the microcatheter, and a pressurized and heparinized saline bag was attached to the catheter. 4 x 40 solitaire device was then selected. Back flush was achieved at the rotating hemostatic valve, and then the device was gently advanced through the microcatheter to the distal end. The retriever was then unsheathed by withdrawing the microcatheter under fluoroscopy. Once the retriever was completely unsheathed, control angiogram was performed from the balloon catheter. Constant aspiration was then performed through the Ace catheter as the retriever was gently and slowly withdrawn with fluoroscopic observation. Once the retriever was entirely removed from the system, free aspiration was confirmed at the hub of the intermediate catheter, with free blood return confirmed. Control angiogram was then performed. Persistent occlusion at the proximal MCA was confirmed. The microcatheter system was then advanced through the Ace catheter. On the 2nd attempt, a lateral view was required in order to guide the microcatheter system beyond the ophthalmic artery origin. Once the micro wire microcatheter were beyond the occlusion, the micro wire was removed and solitaire 4 x 40 device was deployed. After the solitaire was deployed across the occlusion, the Ace catheter was advanced to the M1 segment at the site of the occlusion. Local aspiration was performed upon withdrawal of the solitaire device under fluoroscopic observation. Once the retrieve her was entirely removed from the system, free aspiration was confirmed at the hub of the intermediate catheter, with free blood return confirmed. Control angiogram was again performed. Review of the available imaging was performed at this time. The intermediate catheter was removed and Neuro Max catheter were removed. Diagnostic imaging was performed at the right vert origin. Angiogram of the left carotid system was also  performed including intracranial images. Catheter was removed, and the bright tip sheath was exchanged for a short 8 French sheath at the right common femoral artery. Control angiogram was performed at the right common femoral artery puncture site. After the angiogram, right common femoral sheath was removed with deployment of Exoseal device. Patient tolerated the procedure well and remained hemodynamically stable throughout. No complications were encountered. Estimated blood loss approximately 50 cc. FINDINGS: Baseline angiogram Right common carotid artery:  Normal course caliber and contour. Right external carotid  artery: Patent with antegrade flow. Significant flow through the superficial temporal branch, potentially contributing to collateral flow intracranial. Right internal carotid artery: Normal course caliber and contour of the cervical portion. Vertical and petrous segment patent with normal course caliber contour. Cavernous segment patent. Clinoid segment patent. Antegrade flow of the ophthalmic artery. Ophthalmic segment patent. Terminus patent. Right MCA: Proximal occlusion of the middle cerebral artery. There is excellent collateral flow to the right hemisphere VA anterior cerebral artery. Right ACA: A 1 segment patent. A 2 segment perfuses the right territory. Baseline perfusion TICI 1, with excellent collateral perfusion. After the 1st pass, there is no significant improvement through the occlusion, with persistent TICI 1. After the 2nd pass, there is very minimal improvement in flow through the occluded segment, with persisting TICI 1 flow, and excellent collateral flow maintained. Vertebral origin injection demonstrates normal course contour and caliber of the right vertebral artery, with patent V3 segment. Cross flow from the left vertebral artery with partial washout in the basilar artery. Patent posterior inferior cerebellar artery, anterior inferior cerebellar artery, and superior cerebral  artery. Left carotid angiogram demonstrates normal course caliber and contour of the left CCA and cervical ICA. Patent external carotid artery. Suggestion of mild atherosclerotic changes at the origin of the A1 segment, with patent 82 segment. Patent middle cerebral artery with typical appearance of the arterial, late arterial, capillary and venous phase. IMPRESSION: Status post cerebral angiogram with attempted mechanical thrombectomy of right MCA occlusion. After 2 passes with local aspiration and stentreiver technology, there is persistent occlusion and TICI 1 flow. Excellent collateral flow perfusing the right MCA territory. My impression is that there is likely underlying stenosis at the site of occlusion given the collateral flow, and that further manipulation may cause dissection or hemorrhage. Given the excellent collateral flow and absence of core infarction on the perfusion imaging, we elected to defer further attempts at thrombectomy. Deployment of Exoseal for hemostasis. Signed, Yvone Neu. Loreta Ave, DO [facility] Radiology Electronically Signed   By: [name] D.O.   On: 03/11/2016 13:10     Assessment/Plan: Diagnosis: right MCA infarct 1. Does the need for close, 24 hr/day medical supervision in concert with the patient's rehab needs make it unreasonable for this patient to be served in a less intensive setting? Yes 2. Co-Morbidities requiring supervision/potential complications: htn, post-stroke sequelae, nutrition 3. Due to bladder management, bowel management, safety, skin/wound care, disease management, medication administration, pain management and patient education, does the patient require 24 hr/day rehab nursing? Yes 4. Does the patient require coordinated care of a physician, rehab nurse, PT (1-2 hrs/day, 5 days/week), OT (1-2 hrs/day, 5 days/week) and SLP (1-2 hrs/day, 5 days/week) to address physical and functional deficits in the  context of the above medical diagnosis(es)? Yes Addressing deficits in the following areas: balance, endurance, locomotion, strength, transferring, bowel/bladder control, bathing, dressing, feeding, grooming, toileting, cognition, speech, swallowing and psychosocial support 5. Can the patient actively participate in an intensive therapy program of at least 3 hrs of therapy per day at least 5 days per week? Yes 6. The potential for patient to make measurable gains while on inpatient rehab is excellent 7. Anticipated functional outcomes upon discharge from inpatient rehab are supervision and min assist  with PT, supervision, min assist and mod assist with OT, modified independent and supervision with SLP. 8. Estimated rehab length of stay to reach the above functional goals is: 20-26 days 9. Does the patient have adequate social supports  and living environment to accommodate these discharge functional goals? Yes 10. Anticipated D/C setting: Home 11. Anticipated post D/C treatments: HH therapy and Outpatient therapy 12. Overall Rehab/Functional Prognosis: excellent  RECOMMENDATIONS: This patient's condition is appropriate for continued rehabilitative care in the following setting: CIR Patient has agreed to participate in recommended program. Yes and Potentially Note that insurance prior authorization may be required for reimbursement for recommended care.  Comment: Rehab Admissions Coordinator to follow up.   **Note** Pt speaks no english  Thanks,  Ranelle Oyster, MD, [place] [facility]    Charlton Amor., PA-C 03/12/2016    Revision History

## 2016-03-14 NOTE — Progress Notes (Signed)
Patient was transferred to inpatient rehab [phone] per MD order. Report was given to the nurse who received the patient.

## 2016-03-15 ENCOUNTER — Inpatient Hospital Stay (HOSPITAL_COMMUNITY): Payer: Self-pay

## 2016-03-15 ENCOUNTER — Inpatient Hospital Stay (HOSPITAL_COMMUNITY): Payer: Self-pay | Admitting: Physical Therapy

## 2016-03-15 NOTE — H&P (Signed)
Physical Medicine and Rehabilitation Admission H&P    Chief complaint: Weakness  HPI: Grant Martinezis a 51 y.o.right handed Spanish speaking maleon no prescription medications with history of tobacco abuse. Per chart review patient independent prior to admission living with family. 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 Presented to [facility] with left-sided weakness. Patient was transferred to [facility] for further evaluation. CT of the head showed evolving right MCA infarct as well as emergency large vessel occlusion on the right MCAper MRA.Marland KitchenMRI showed acute patchy multifocal right MCA territory infarct. Patient did not receive TPA. Neuro interventional radiology consulted underwent thrombectomy. Echocardiogram with ejection fraction of 55% no wall motion abnormalities. Neurology consulted currently on Plavix and aspirin for CVA prophylaxis and workup ongoing. Subcutaneous Lovenox for DVT prophylaxis. Dysphagia #1 thinliquid diet. Physical and occupational therapy evaluations completed 03/12/2016 with recommendations of physical medicine rehabilitation consult. Patient was admitted for a comprehensive rehabilitation program  Review of Systems  Unable to perform ROS: Language       Past Medical History:  Diagnosis Date  . Medical history non-contributory         Past Surgical History:  Procedure Laterality Date  . IR GENERIC HISTORICAL  03/11/2016   IR ANGIO VERTEBRAL SEL VERTEBRAL UNI R MOD SED 03/11/2016 [name], DO [facility]  . IR GENERIC HISTORICAL  03/11/2016   IR ANGIO INTRA EXTRACRAN SEL COM CAROTID INNOMINATE UNI L MOD SED 03/11/2016 [name], DO [facility]  . IR GENERIC HISTORICAL  03/11/2016   IR PERCUTANEOUS ART THROMBECTOMY/INFUSION INTRACRANIAL INC DIAG ANGIO 03/11/2016 [name], DO [facility]  . IR GENERIC HISTORICAL  03/11/2016   IR US GUIDE VASC ACCESS RIGHT 03/11/2016 [name], DO  [facility]  . RADIOLOGY WITH ANESTHESIA N/A 03/11/2016   Procedure: RADIOLOGY WITH ANESTHESIA;  Surgeon: Medication Radiologist, MD;  Location: [place];  Service: Radiology;  Laterality: N/A;   History reviewed. No pertinent family history. Social History:  reports that he has been smoking Cigarettes.  He has a 60.00 pack-year smoking history. He has never used smokeless tobacco. He reports that he does not drink alcohol or use drugs. Allergies: No Known Allergies No prescriptions prior to admission.    Home: Home Living Family/patient expects to be discharged to:: Inpatient rehab Living Arrangements: Spouse/significant other, Children Available Help at Discharge: Family, Available 24 hours/day Type of Home: House Home Access: Level entry Home Layout: Able to live on main level with bedroom/bathroom Bathroom Shower/Tub: Tub/shower unit, Curtain Home Equipment: None   Functional History: Prior Function Level of Independence: Independent Comments: Pt works as Development worker, international aid.  Functional Status:  Mobility: Bed Mobility Overal bed mobility: Needs Assistance Bed Mobility: Sit to Supine Supine to sit: Min assist, HOB elevated Sit to supine: Max assist General bed mobility comments: assist to lower trunk to bed and to lift LEs onto bed  Transfers Overall transfer level: Needs assistance Equipment used: 1 person hand held assist Transfers: Sit to/from Stand, Stand Pivot Transfers Sit to Stand: Mod assist Stand pivot transfers: Mod assist General transfer comment: facilitation for anterior translation of trunk and assist to move into standing and assist for balance   ADL: ADL Overall ADL's : Needs assistance/impaired Grooming: Wash/dry hands, Wash/dry face, Brushing hair, Moderate assistance, Sitting Grooming Details (indicate cue type and reason): assist to comb left side  Upper Body Bathing: Moderate assistance, Sitting Lower Body Bathing: Maximal assistance, Sit to/from  stand Upper Body Dressing : Maximal assistance, Sitting Lower  Body Dressing: Total assistance, Sit to/from stand Toilet Transfer: Moderate assistance, Stand-pivot, BSC Toileting- Clothing Manipulation and Hygiene: Total assistance, Sit to/from stand Functional mobility during ADLs: Moderate assistance General ADL Comments: Pt neglecting Lt side q  Cognition: Cognition Overall Cognitive Status: Impaired/Different from baseline Orientation Level: Oriented to person, Oriented to place, Oriented to time, Disoriented to situation Cognition Arousal/Alertness: Awake/alert, Lethargic Behavior During Therapy: Flat affect Overall Cognitive Status: Impaired/Different from baseline Area of Impairment: Orientation, Attention, Following commands, Safety/judgement, Awareness, Problem solving Orientation Level: Disoriented to, Situation, Place Current Attention Level: Sustained (up to 45 seconds with min cues ) Following Commands: Follows one step commands consistently, Follows multi-step commands with increased time Safety/Judgement: Decreased awareness of safety, Decreased awareness of deficits Awareness:  (unable to state he had a stroke ) Problem Solving: Slow processing, Difficulty sequencing, Requires verbal cues, Requires tactile cues General Comments: Pt knows he's in a hospital, but thinks he's in [facility].   When asked, he states he's in hospital because he's sick.  He states he is normal with no deficits.   Physical Exam: Blood pressure 132/71, pulse (!) 49, temperature 98.5 F (36.9 C), temperature source Oral, resp. rate 16, height 5' 6"  (1.676 m), weight 73.6 kg (162 lb 4.1 oz), SpO2 96 %. Physical Exam  Vitals reviewed. Constitutional: He appears well-developed. No distress.  HENT:  Head: Normocephalic and atraumatic.  Eyes: EOM are normal. Left eye exhibits no discharge.  Neck: Normal range of motion. Neck supple. No JVD present. No thyromegaly present.  Cardiovascular:  Normal rate and regular rhythm.  Exam reveals no gallop.   No murmur heard. Respiratory: Effort normal and breath sounds normal. No respiratory distress. He has no wheezes. He has no rales.  GI: Soft. Bowel sounds are normal. He exhibits no distension. There is no tenderness. There is no rebound.  Musculoskeletal: Normal range of motion.  Skin: Skin is warm and dry. He is not diaphoretic.  Skin. Warm and dry Neurological:  Patient primarily Spanish-speaking. Speech slurred.  Able to follow basic commands when spoken in [place].  RUE and RLE motor 5/5. LUE and LLE remain 0/5 prox to distal with mild flexor tone pattern LUE. Left inattention. Decreased sensation to LT/has some sense of pain in LLE and withdraws to pinch.  Left central 7. Cognitively appears to have reasonable insight and awareness. Comprehends simple comments when spoken to in [place].   Lab Results Last 48 Hours        Results for orders placed or performed during the hospital encounter of 03/11/16 (from the past 48 hour(s))  Triglycerides     Status: None   Collection Time: 03/11/16  1:00 PM  Result Value Ref Range   Triglycerides 142 <150 mg/dL  MRSA PCR Screening     Status: None   Collection Time: 03/11/16  1:56 PM  Result Value Ref Range   MRSA by PCR NEGATIVE NEGATIVE    Comment:        The GeneXpert MRSA Assay (FDA approved for NASAL specimens only), is one component of a comprehensive MRSA colonization surveillance program. It is not intended to diagnose MRSA infection nor to guide or monitor treatment for MRSA infections.   Draw ABG 1 hour after initiation of ventilator     Status: Abnormal   Collection Time: 03/11/16  2:30 PM  Result Value Ref Range   FIO2 1.00    Delivery systems VENTILATOR    Mode PRESSURE REGULATED VOLUME CONTROL    VT 510.0 mL  LHR 14.0 resp/min   Peep/cpap 5.0 cm H20   pH, Arterial 7.424 7.350 - 7.450   pCO2 arterial 35.7 32.0 - 48.0 mmHg   pO2,  Arterial 394 (H) 83.0 - 108.0 mmHg   Bicarbonate 23.1 20.0 - 28.0 mmol/L   Acid-base deficit 0.8 0.0 - 2.0 mmol/L   O2 Saturation 99.8 %   Patient temperature 97.8    Collection site A-LINE    Drawn by [id number]    Sample type ARTERIAL DRAW    Allens test (pass/fail) PASS PASS  Basic metabolic panel     Status: Abnormal   Collection Time: 03/11/16  4:23 PM  Result Value Ref Range   Sodium 141 135 - 145 mmol/L   Potassium 3.8 3.5 - 5.1 mmol/L   Chloride 109 101 - 111 mmol/L   CO2 23 22 - 32 mmol/L   Glucose, Bld 151 (H) 65 - 99 mg/dL   BUN 9 6 - 20 mg/dL   Creatinine, Ser 0.67 0.61 - 1.24 mg/dL   Calcium 8.8 (L) 8.9 - 10.3 mg/dL   GFR calc non Af Amer >60 >60 mL/min   GFR calc Af Amer >60 >60 mL/min    Comment: (NOTE) The eGFR has been calculated using the CKD EPI equation. This calculation has not been validated in all clinical situations. eGFR's persistently <60 mL/min signify possible Chronic Kidney Disease.    Anion gap 9 5 - 15  Hemoglobin A1c     Status: None   Collection Time: 03/12/16  5:00 AM  Result Value Ref Range   Hgb A1c MFr Bld 5.6 4.8 - 5.6 %    Comment: (NOTE)         Pre-diabetes: 5.7 - 6.4         Diabetes: >6.4         Glycemic control for adults with diabetes: <7.0    Mean Plasma Glucose 114 mg/dL    Comment: (NOTE) Performed At: [facility] [place], [place] [id number] Lindon Romp MD JQ:[id number]   Lipid panel     Status: Abnormal   Collection Time: 03/12/16  5:00 AM  Result Value Ref Range   Cholesterol 190 0 - 200 mg/dL   Triglycerides 118 <150 mg/dL   HDL 32 (L) >40 mg/dL   Total CHOL/HDL Ratio 5.9 RATIO   VLDL 24 0 - 40 mg/dL   LDL Cholesterol 134 (H) 0 - 99 mg/dL    Comment:        Total Cholesterol/HDL:CHD Risk Coronary Heart Disease Risk Table                     Men   Women  1/2 Average Risk   3.4   3.3  Average Risk       5.0   4.4  2 X Average Risk   9.6    7.1  3 X Average Risk  23.4   11.0        Use the calculated Patient Ratio above and the CHD Risk Table to determine the patient's CHD Risk.        ATP III CLASSIFICATION (LDL):  <100     mg/dL   Optimal  100-129  mg/dL   Near or Above                    Optimal  130-159  mg/dL   Borderline  160-189  mg/dL   High  >190  mg/dL   Very High   CBC     Status: Abnormal   Collection Time: 03/12/16  5:00 AM  Result Value Ref Range   WBC 17.5 (H) 4.0 - 10.5 K/uL   RBC 4.56 4.22 - 5.81 MIL/uL   Hemoglobin 12.8 (L) 13.0 - 17.0 g/dL   HCT 38.7 (L) 39.0 - 52.0 %   MCV 84.9 78.0 - 100.0 fL   MCH 28.1 26.0 - 34.0 pg   MCHC 33.1 30.0 - 36.0 g/dL   RDW 13.3 11.5 - 15.5 %   Platelets 146 (L) 150 - 400 K/uL  Magnesium     Status: None   Collection Time: 03/12/16  5:00 AM  Result Value Ref Range   Magnesium 2.0 1.7 - 2.4 mg/dL  Phosphorus     Status: None   Collection Time: 03/12/16  5:00 AM  Result Value Ref Range   Phosphorus 3.6 2.5 - 4.6 mg/dL  Basic metabolic panel     Status: Abnormal   Collection Time: 03/12/16  5:00 AM  Result Value Ref Range   Sodium 139 135 - 145 mmol/L   Potassium 3.6 3.5 - 5.1 mmol/L   Chloride 105 101 - 111 mmol/L   CO2 26 22 - 32 mmol/L   Glucose, Bld 109 (H) 65 - 99 mg/dL   BUN 12 6 - 20 mg/dL   Creatinine, Ser 0.67 0.61 - 1.24 mg/dL   Calcium 8.9 8.9 - 10.3 mg/dL   GFR calc non Af Amer >60 >60 mL/min   GFR calc Af Amer >60 >60 mL/min    Comment: (NOTE) The eGFR has been calculated using the CKD EPI equation. This calculation has not been validated in all clinical situations. eGFR's persistently <60 mL/min signify possible Chronic Kidney Disease.    Anion gap 8 5 - 15  Rapid urine drug screen (hospital performed)     Status: None   Collection Time: 03/12/16  4:11 PM  Result Value Ref Range   Opiates NONE DETECTED NONE DETECTED   Cocaine NONE DETECTED NONE DETECTED   Benzodiazepines NONE DETECTED NONE  DETECTED   Amphetamines NONE DETECTED NONE DETECTED   Tetrahydrocannabinol NONE DETECTED NONE DETECTED   Barbiturates NONE DETECTED NONE DETECTED    Comment:        DRUG SCREEN FOR MEDICAL PURPOSES ONLY.  IF CONFIRMATION IS NEEDED FOR ANY PURPOSE, NOTIFY LAB WITHIN 5 DAYS.        LOWEST DETECTABLE LIMITS FOR URINE DRUG SCREEN Drug Class       Cutoff (ng/mL) Amphetamine      1000 Barbiturate      200 Benzodiazepine   950 Tricyclics       932 Opiates          300 Cocaine          300 THC              50   CBC     Status: Abnormal   Collection Time: 03/13/16  3:54 AM  Result Value Ref Range   WBC 10.1 4.0 - 10.5 K/uL   RBC 4.72 4.22 - 5.81 MIL/uL   Hemoglobin 13.3 13.0 - 17.0 g/dL   HCT 40.2 39.0 - 52.0 %   MCV 85.2 78.0 - 100.0 fL   MCH 28.2 26.0 - 34.0 pg   MCHC 33.1 30.0 - 36.0 g/dL   RDW 13.2 11.5 - 15.5 %   Platelets 123 (L) 150 - 400 K/uL  Basic metabolic panel  Status: Abnormal   Collection Time: 03/13/16  3:54 AM  Result Value Ref Range   Sodium 139 135 - 145 mmol/L   Potassium 3.6 3.5 - 5.1 mmol/L   Chloride 105 101 - 111 mmol/L   CO2 26 22 - 32 mmol/L   Glucose, Bld 110 (H) 65 - 99 mg/dL   BUN 11 6 - 20 mg/dL   Creatinine, Ser 0.67 0.61 - 1.24 mg/dL   Calcium 9.0 8.9 - 10.3 mg/dL   GFR calc non Af Amer >60 >60 mL/min   GFR calc Af Amer >60 >60 mL/min    Comment: (NOTE) The eGFR has been calculated using the CKD EPI equation. This calculation has not been validated in all clinical situations. eGFR's persistently <60 mL/min signify possible Chronic Kidney Disease.    Anion gap 8 5 - 15      Imaging Results (Last 48 hours)  Ct Head Wo Contrast  Result Date: 03/11/2016 CLINICAL DATA:  Status post mechanical thrombectomy. EXAM: CT HEAD WITHOUT CONTRAST TECHNIQUE: Contiguous axial images were obtained from the base of the skull through the vertex without intravenous contrast. COMPARISON:  Head CT from earlier today  FINDINGS: Brain: Stable perforator type infarct in the right basal ganglia and internal capsule. No evidence of infarct progression. No acute hemorrhage. No hydrocephalus or mass. Vascular: Reason intravenous contrast. No detectable asymmetric enhancement. Skull: Remote craniotomy on the right for hematoma evacuation. Sinuses/Orbits: Nasopharyngeal fluid in the setting of intubation. IMPRESSION: Stable postprocedural CT. No progression of the lenticulostriate infarct and no acute hemorrhage. Electronically Signed   By: Monte Fantasia M.D.   On: 03/11/2016 12:41   Mr Brain Wo Contrast  Result Date: 03/12/2016 CLINICAL DATA:  Known RIGHT M1 occlusion, follow-up stroke. EXAM: MRI HEAD WITHOUT CONTRAST MRA HEAD WITHOUT CONTRAST TECHNIQUE: Multiplanar, multiecho pulse sequences of the brain and surrounding structures were obtained without intravenous contrast. Angiographic images of the head were obtained using MRA technique without contrast. Patient was unable to tolerate further imaging, coronal T2 not seen. COMPARISON:  CT HEAD March 11, 2016 FINDINGS: MRI HEAD FINDINGS- mildly motion degraded examination. BRAIN: Patchy reduced diffusion RIGHT basal ganglia, RIGHT frontotemporal parietal lobes with corresponding low ADC values faint clear T2 hyperintense signal. No susceptibility artifact to suggest hemorrhage. No susceptibility artifact to suggest hemorrhage. The ventricles and sulci are normal for patient's age. No suspicious parenchymal signal, masses or mass effect. No abnormal extra-axial fluid collections. VASCULAR: Normal major intracranial vascular flow voids present at skull base. SKULL AND UPPER CERVICAL SPINE: No abnormal sellar expansion. No suspicious calvarial bone marrow signal. Craniocervical junction maintained. SINUSES/ORBITS: The mastoid air-cells and included paranasal sinuses are well-aerated. The included ocular globes and orbital contents are non-suspicious. OTHER: None. MRA HEAD  FINDINGS- moderately motion degraded ANTERIOR CIRCULATION: Flow related enhancement bilateral internal carotid arteries, apparent mild narrowing RIGHT cavernous internal carotid artery, the vessel is patent on prior CT and this is attributable to calcific atherosclerosis. Chronically occluded proximal RIGHT M1 segments, no collateral vessels identified on time-of-flight MRA. LEFT middle cerebral artery and bilateral anterior cerebral arteries are patent. POSTERIOR CIRCULATION: Bilateral vertebral arteries, basilar artery are patent. Limited assessment of the main branch vessels due to patient motion. Patent bilateral posterior cerebral arteries. Small RIGHT posterior communicating artery present as seen on prior CTA. ANATOMIC VARIANTS: None. 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 No hemorrhage. MRA HEAD: Moderately motion degraded examination. RIGHT M1 occlusion, no collateral vessels by time-of-flight technique. Electronically Signed   By:  Elon Alas M.D.   On: 03/12/2016 05:07   Portable Chest Xray  Result Date: 03/12/2016 CLINICAL DATA:  Respiratory failure.  Stroke. EXAM: PORTABLE CHEST 1 VIEW COMPARISON:  03/11/2016. FINDINGS: Interim extubation. Mild narrowing of the upper trachea noted. Trachea stenosis or paratracheal process cannot be excluded. Mediastinum hilar structures normal. Cardiomegaly. Mild pulmonary venous congestion. No focal infiltrate. No pleural effusion or pneumothorax. No acute bony abnormality . IMPRESSION: 1. Interim extubation. Mild narrowing of the upper trachea noted. Tracheal stenosis or peritracheal process cannot be excluded . 2. Cardiomegaly with mild pulmonary venous congestion. No overt pulmonary edema. Electronically Signed   By: Marcello Moores  Register   On: 03/12/2016 07:25   Portable Chest Xray  Result Date: 03/11/2016 CLINICAL DATA:  Hypoxia EXAM: PORTABLE CHEST 1 VIEW COMPARISON:  None. FINDINGS: Endotracheal tube tip is 2.9 cm  above the carina. No pneumothorax. There is no edema or consolidation. Heart size and pulmonary vascularity are normal. No adenopathy. No bone lesions. IMPRESSION: Endotracheal tube as described without pneumothorax. No edema or consolidation. Electronically Signed   By: Lowella Grip III M.D.   On: 03/11/2016 15:56   Mr Jodene Nam Head/brain BX Cm  Result Date: 03/12/2016 CLINICAL DATA:  Known RIGHT M1 occlusion, follow-up stroke. EXAM: MRI HEAD WITHOUT CONTRAST MRA HEAD WITHOUT CONTRAST TECHNIQUE: Multiplanar, multiecho pulse sequences of the brain and surrounding structures were obtained without intravenous contrast. Angiographic images of the head were obtained using MRA technique without contrast. Patient was unable to tolerate further imaging, coronal T2 not seen. COMPARISON:  CT HEAD March 11, 2016 FINDINGS: MRI HEAD FINDINGS- mildly motion degraded examination. BRAIN: Patchy reduced diffusion RIGHT basal ganglia, RIGHT frontotemporal parietal lobes with corresponding low ADC values faint clear T2 hyperintense signal. No susceptibility artifact to suggest hemorrhage. No susceptibility artifact to suggest hemorrhage. The ventricles and sulci are normal for patient's age. No suspicious parenchymal signal, masses or mass effect. No abnormal extra-axial fluid collections. VASCULAR: Normal major intracranial vascular flow voids present at skull base. SKULL AND UPPER CERVICAL SPINE: No abnormal sellar expansion. No suspicious calvarial bone marrow signal. Craniocervical junction maintained. SINUSES/ORBITS: The mastoid air-cells and included paranasal sinuses are well-aerated. The included ocular globes and orbital contents are non-suspicious. OTHER: None. MRA HEAD FINDINGS- moderately motion degraded ANTERIOR CIRCULATION: Flow related enhancement bilateral internal carotid arteries, apparent mild narrowing RIGHT cavernous internal carotid artery, the vessel is patent on prior CT and this is attributable to  calcific atherosclerosis. Chronically occluded proximal RIGHT M1 segments, no collateral vessels identified on time-of-flight MRA. LEFT middle cerebral artery and bilateral anterior cerebral arteries are patent. POSTERIOR CIRCULATION: Bilateral vertebral arteries, basilar artery are patent. Limited assessment of the main branch vessels due to patient motion. Patent bilateral posterior cerebral arteries. Small RIGHT posterior communicating artery present as seen on prior CTA. ANATOMIC VARIANTS: None. 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 No hemorrhage. MRA HEAD: Moderately motion degraded examination. RIGHT M1 occlusion, no collateral vessels by time-of-flight technique. Electronically Signed   By: Elon Alas M.D.   On: 03/12/2016 05:07        Medical Problem List and Plan: 1.  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  DVT Prophylaxis/Anticoagulation: Subcutaneous Lovenox. Monitor platelet counts and any signs of bleeding 3. Pain Management: Tylenol as needed 4. Mood: Provide emotional  support 5. Neuropsych: This patient is capable of making decisions on his own behalf. 6. Skin/Wound Care: Routine skin checks 7. Fluids/Electrolytes/Nutrition: Routine I&O with follow-up chemistries             -encourage PO 8. Dysphagia. Dysphagia #1 thin liquid diet. Monitor for any aspiration. Hollow up speech therapy 9. Hyperlipidemia. Lipitor 10. Tobacco abuse. Counseling    Post Admission Physician Evaluation: 1. Functional deficits secondary  to right MCA infarct. 2. Patient is admitted to receive collaborative, interdisciplinary care between the physiatrist, rehab nursing staff, and therapy team. 3. Patient's level of medical complexity and substantial therapy needs in context of that medical necessity cannot be provided at  a lesser intensity of care such as a SNF. 4. Patient has experienced substantial functional loss from his/her baseline which was documented above under the "Functional History" and "Functional Status" headings.  Judging by the patient's diagnosis, physical exam, and functional history, the patient has potential for functional progress which will result in measurable gains while on inpatient rehab.  These gains will be of substantial and practical use upon discharge  in facilitating mobility and self-care at the household level. 5. Physiatrist will provide 24 hour management of medical needs as well as oversight of the therapy plan/treatment and provide guidance as appropriate regarding the interaction of the two. 6. The Preadmission Screening has been reviewed and patient status is unchanged unless otherwise stated above. 7. 24 hour rehab nursing will assist with bladder management, bowel management, safety, skin/wound care, disease management, medication administration, pain management and patient education  and help integrate therapy concepts, techniques,education, etc. 8. PT will assess and treat for/with: Lower extremity strength, range of motion, stamina, balance, functional mobility, safety, adaptive techniques and equipment, NMR, visual-spatial awareness, family education.   Goals are: min to mod assist. 9. OT will assess and treat for/with: ADL's, functional mobility, safety, upper extremity strength, adaptive techniques and equipment, NMR, cognitve perceptual rx, family ed.   Goals are: min to mod assist. Therapy may proceed with showering this patient. 10. SLP will assess and treat for/with: speech, swallowing, communication, cognition.  Goals are: supervision. 11. Case Management and Social Worker will assess and treat for psychological issues and discharge planning. 12. Team conference will be held weekly to assess progress toward goals and to determine barriers to discharge. 13. Patient will  receive at least 3 hours of therapy per day at least 5 days per week. 14. ELOS: 20-27 days       15. Prognosis:  excellent     Meredith Staggers, MD, [facility] 03/14/2016    Cathlyn Parsons., PA-C 03/13/2016     This exam and document were completed on 03/14/16

## 2016-03-15 NOTE — Evaluation (Signed)
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 Smoker   . Hyperlipidemia   . CVA (cerebral vascular accident) ([place]) 03/11/2016  . Respiratory failure [facility])     Past Medical History:  Past Medical History:  Diagnosis Date  . Medical history non-contributory    Past Surgical History:  Past Surgical History:  Procedure Laterality Date  . IR GENERIC HISTORICAL  03/11/2016   IR ANGIO VERTEBRAL SEL VERTEBRAL UNI R MOD SED 03/11/2016 [name], DO [facility]  . IR GENERIC HISTORICAL  03/11/2016   IR ANGIO INTRA EXTRACRAN SEL COM CAROTID INNOMINATE UNI L MOD SED 03/11/2016 [name], DO [facility]  . IR GENERIC HISTORICAL  03/11/2016   IR PERCUTANEOUS ART THROMBECTOMY/INFUSION INTRACRANIAL INC DIAG ANGIO 03/11/2016 [name], DO [facility]  . IR GENERIC HISTORICAL  03/11/2016   IR US GUIDE VASC ACCESS RIGHT 03/11/2016 [name], DO [facility]  . RADIOLOGY WITH ANESTHESIA N/A 03/11/2016   Procedure: RADIOLOGY WITH ANESTHESIA;  Surgeon: Medication Radiologist, MD;  Location: [place];  Service: Radiology;  Laterality: N/A;    Assessment & Plan Clinical Impression: Patient is a 51 y.o.right handed Spanish speaking maleon no prescription medications with history of tobacco abuse. Per chart review patient independent prior to admission living with family. 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 Presented to [facility] with left-sided weakness. Patient was transferred to [facility] for further  evaluation. CT of the head showed evolving right MCA infarct as well as emergency large vessel occlusion on the right MCAper MRA.Marland KitchenMRI showed acute patchy multifocal right MCA territory infarct. Patient did not receive TPA. Neuro interventional radiology consulted underwent thrombectomy. Echocardiogram with ejection fraction of 55% no wall motion abnormalities. Neurology consulted currently on aspirin for CVA prophylaxis and workup ongoing. Subcutaneous Lovenox for DVT prophylaxis. Dysphagia #1 thinliquid diet. Physical therapy evaluation completed 03/12/2016 with recommendations of physical medicine rehabilitation consult..    Patient transferred to CIR on 03/14/2016.    Patient currently requires mod with basic self-care skills secondary to unbalanced muscle activation, decreased coordination and decreased motor planning and decreased awareness, decreased problem solving and decreased safety awareness.  Prior to hospitalization, patient could complete BADL and iADL independently.   Patient will benefit from skilled intervention to decrease level of assist with basic self-care skills prior to discharge home with care partner.  Anticipate patient will require minimal physical assistance and no further OT follow recommended.  OT - End of Session Activity Tolerance: Tolerates 30+ min activity with multiple rests Endurance Deficit: Yes Endurance Deficit Description: 2/2 fatigue, reduced cardiopulmonary endurance OT Assessment Rehab Potential (ACUTE ONLY): Good Barriers to Discharge: Decreased caregiver support OT Patient demonstrates impairments in the following area(s): Balance;Cognition;Endurance;Motor;Perception;Safety;Vision OT Basic ADL's Functional Problem(s): Eating;Grooming;Bathing;Dressing;Toileting OT Transfers Functional Problem(s): Toilet;Tub/Shower OT Additional Impairment(s): Fuctional Use of Upper Extremity OT Plan OT Intensity: Minimum of 1-2 x/day, 45 to 90 minutes OT Frequency:  5 out of 7 days OT Duration/Estimated Length of Stay: 3 weeks OT Treatment/Interventions: Balance/vestibular training;Neuromuscular re-education;Discharge planning;DME/adaptive equipment instruction;Cognitive remediation/compensation;Therapeutic Activities;Therapeutic Exercise;UE/LE Coordination activities;Visual/perceptual remediation/compensation;Functional mobility  training;Patient/family education;Self Care/advanced ADL retraining;Wheelchair propulsion/positioning;UE/LE Strength taining/ROM OT Self Feeding Anticipated Outcome(s): Setup, using AE prn OT Basic Self-Care Anticipated Outcome(s): Min assist OT Toileting Anticipated Outcome(s): Supervision OT Bathroom Transfers Anticipated Outcome(s): Min Assist OT Recommendation Patient destination: Home Follow Up Recommendations: None Equipment Recommended: To be determined  Skilled Therapeutic Intervention OT 1:1 evaluation completed with interpreter and spouse present.  Pt and family educated on methods and goals of treatment.   Pt instructed on NMR of LUE with need to attend to LUE during transfers and functional tasks as feasible.   Pt able to self-feed 75% effectively but requires assist to problem-solve.  Wash mit provided and pt attempted hand-over-hand assist to wash his left leg and chest using wash mit during bathing.   No clothing present for initial session.    OT Evaluation Precautions/Restrictions  Precautions Precautions: Fall Precaution Comments: L side weakness Restrictions Weight Bearing Restrictions: No   General Chart Reviewed: Yes Family/Caregiver Present: Yes   Vital Signs Therapy Vitals Pulse Rate: 64 BP: 137/85 Patient Position (if appropriate): Sitting Oxygen Therapy SpO2: 97 % O2 Device: Not Delivered Pulse Oximetry Type: Intermittent   Pain Pain Assessment Pain Assessment: No/denies pain Pain Score: 0-No pain   Home Living/Prior Functioning Home Living Available Help at Discharge: Family,  Available 24 hours/day Type of Home: House Home Access:  (single threshold step to enter) Home Layout: One level (with basement but it is not currently used) Bathroom Shower/Tub: Estate agent Accessibility: Yes  Lives With: Spouse IADL History Homemaking Responsibilities: Yes Meal Prep Responsibility: No Laundry Responsibility: No Cleaning Responsibility: No Bill Paying/Finance Responsibility: No Shopping Responsibility: No Child Care Responsibility: No Current License: Yes Mode of Transportation: Car Occupation: Full time employment Type of Occupation: Biomedical scientist  Prior Function Level of Independence: Independent with basic ADLs, Independent with gait, Independent with transfers  Able to Take Stairs?: Yes Driving: Yes Vocation: Full time employment Vocation Requirements: works as a Development worker, international aid Leisure: Hobbies-yes (Comment) Comments: gardening at home   ADL ADL ADL Comments: see Functional Assessment Tool   Vision/Perception  Vision- History Baseline Vision/History: No visual deficits Patient Visual Report: Blurring of vision Vision- Assessment Vision Assessment?: Vision impaired- to be further tested in functional context Praxis Praxis-Other Comments: difficult to assess d/t language barrier   Cognition Overall Cognitive Status: Impaired/Different from baseline Arousal/Alertness: Awake/alert Orientation Level: Person;Place;Situation Person: Oriented Place: Oriented Situation: Oriented Year: 2018 Month: February Day of Week: Incorrect Memory: Impaired Memory Impairment: Decreased recall of new information Immediate Memory Recall:  (unable to assess d/t language barrier) Memory Recall:  (unable to assess d/t language barrier) Attention: Sustained Focused Attention: Appears intact Sustained Attention: Appears intact Awareness: Impaired Awareness Impairment: Emergent impairment Problem Solving: Impaired Problem Solving  Impairment: Functional basic Executive Function: Organizing;Sequencing;Initiating;Self Monitoring;Self Correcting Sequencing: Impaired Sequencing Impairment: Functional complex Organizing: Impaired Organizing Impairment: Functional basic Initiating: Impaired Initiating Impairment: Functional complex Self Monitoring: Impaired Self Monitoring Impairment: Functional basic Self Correcting: Impaired Self Correcting Impairment: Functional basic Behaviors: Impulsive Safety/Judgment: Impaired   Sensation Sensation Light Touch: Appears Intact Stereognosis: Impaired by gross assessment (@ LUE) Proprioception: Impaired by gross assessment Coordination Gross Motor Movements are Fluid and Coordinated: No Fine Motor Movements are Fluid and Coordinated: No Coordination and Movement Description: left hemiplegia   Motor  Motor Motor: Hemiplegia (left) Motor - Skilled Clinical Observations: general weakness   Mobility  Bed Mobility Bed Mobility: Supine to Sit Supine to Sit: 3: Mod assist;With rails Supine to Sit Details: Tactile cues for  initiation;Tactile cues for posture;Tactile cues for sequencing;Tactile cues for placement;Tactile cues for weight shifting;Visual cues/gestures for sequencing;Verbal cues for sequencing;Visual cues for safe use of DME/AE;Verbal cues for technique;Verbal cues for safe use of DME/AE;Manual facilitation for placement;Manual facilitation for weight bearing Transfers Sit to Stand: 3: Mod assist;With armrests Sit to Stand Details: Tactile cues for initiation;Tactile cues for posture;Verbal cues for sequencing;Verbal cues for technique;Manual facilitation for weight shifting;Tactile cues for placement;Tactile cues for sequencing;Tactile cues for weight shifting;Verbal cues for precautions/safety   Trunk/Postural Assessment  Cervical Assessment Cervical Assessment: Within Functional Limits Thoracic Assessment Thoracic Assessment: Within Functional Limits Lumbar  Assessment Lumbar Assessment: Within Functional Limits Postural Control Postural Control: Deficits on evaluation Righting Reactions: left lean Protective Responses: impaired   Balance Balance Balance Assessed: Yes Static Sitting Balance Static Sitting - Balance Support: Right upper extremity supported;Feet supported Static Sitting - Level of Assistance: 3: Mod assist Dynamic Sitting Balance Sitting balance - Comments: LOB to the left and pt unable to control it when trying to move with his right side.  Static Standing Balance Static Standing - Balance Support: No upper extremity supported;During functional activity Static Standing - Level of Assistance: 3: Mod assist Static Standing - Comment/# of Minutes: 1   Extremity/Trunk Assessment RUE Assessment RUE Assessment: Within Functional Limits LUE Assessment LUE Assessment: Exceptions to WFL LUE Tone LUE Tone: Flaccid   See Function Navigator for Current Functional Status.   Refer to Care Plan for Long Term Goals  Recommendations for other services: None    Discharge Criteria: Patient will be discharged from OT if patient refuses treatment 3 consecutive times without medical reason, if treatment goals not met, if there is a change in medical status, if patient makes no progress towards goals or if patient is discharged from hospital.  The above assessment, treatment plan, treatment alternatives and goals were discussed and mutually agreed upon: by patient  [facility] 03/15/2016, 12:45 PM

## 2016-03-15 NOTE — Progress Notes (Signed)
[place] PHYSICAL MEDICINE & REHABILITATION     PROGRESS NOTE    Subjective/Complaints: No problems overnight. Able to sleep. Appears comfortable  Basic ROS without issues/limited due to language  Objective: Vital Signs: Blood pressure 137/85, pulse 64, temperature 98.7 F (37.1 C), temperature source Oral, resp. rate 18, height 5\' 6"  (1.676 m), weight 74.6 kg (164 lb 6.4 oz), SpO2 97 %. No results found.  Recent Labs  03/13/16 0354 03/14/16 0628  WBC 10.1 7.9  HGB 13.3 13.7  HCT 40.2 41.0  PLT 123* 142*    Recent Labs  03/13/16 0354 03/14/16 0628  NA 139 140  K 3.6 3.8  CL 105 107  GLUCOSE 110* 104*  BUN 11 9  CREATININE 0.67 0.60*  CALCIUM 9.0 9.1   CBG (last 3)  No results for input(s): GLUCAP in the last 72 hours.  Wt Readings from Last 3 Encounters:  03/14/16 74.6 kg (164 lb 6.4 oz)  03/13/16 73.6 kg (162 lb 4.1 oz)    Physical Exam:  Constitutional: He appears well-developed. No distress.  HENT:  Head: Normocephalicand atraumatic.  Eyes: EOMare normal. Left eye exhibits no discharge.  Neck: Normal range of motion. Neck supple. No JVDpresent. No thyromegalypresent.  Cardiovascular: RRR No murmurheard. Respiratory:CTA without wheezes GI: Soft. Bowel sounds are normal. He exhibits no distension. There is no tenderness. There is no rebound.  Musculoskeletal: Normal range of motion.  Skin: Skin is warmand dry. He is not diaphoretic.  Skin. Warm and dry Neurological:   Speech sl dysarthric. Able to follow basic commands when spoken in Spanish. RUE and RLE motor 5/5. LUE and LLE remain0/5 prox to distal with mildflexor tone pattern LUE. Left inattention.Decreased sensation to LT/has some sense of pain in LLE and withdraws to pinch. Left central 7. Fair insight and awareness.   Assessment/Plan: 1. Functional and mobility deficits secondary to right MCA infarct which require 3+ hours per day of interdisciplinary therapy in a comprehensive  inpatient rehab setting. Physiatrist is providing close team supervision and 24 hour management of active medical problems listed below. Physiatrist and rehab team continue to assess barriers to discharge/monitor patient progress toward functional and medical goals.  Function:  Bathing Bathing position      Bathing parts      Bathing assist        Upper Body Dressing/Undressing Upper body dressing                    Upper body assist        Lower Body Dressing/Undressing Lower body dressing                                  Lower body assist        Toileting Toileting Toileting activity did not occur: No continent bowel/bladder event        Toileting assist     Transfers Chair/bed Optician, dispensing          Cognition Comprehension Comprehension assist level: Follows basic conversation/direction with extra time/assistive device  Expression Expression assist level:  (difficult to assess R/T Spanish speaking)  Social Interaction Social Interaction assist level: Interacts appropriately 50 - 74% of the time - May be physically or verbally inappropriate.  Problem Solving Problem solving assist level:  (difficult  to assess)  Memory Memory assist level:  (difficult to assess R/T Spanish speaking)   Medical Problem List and Plan: 1. 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 DVT Prophylaxis/Anticoagulation: Subcutaneous Lovenox. Monitor platelet counts and any signs of bleeding 3. Pain Management: Tylenol as needed 4. Mood: Provide emotional support 5. Neuropsych: This patient iscapable of making decisions on hisown behalf. 6. Skin/Wound Care: Routine skin checks 7. Fluids/Electrolytes/Nutrition: Routine I&O with follow-up chemistries -encourage  PO 8.Dysphagia. Dysphagia #1 thin liquid diet. Monitor for any aspiration. Advance per speech therapy 9.Hyperlipidemia. Lipitor 10.Tobacco abuse. Counseling   LOS (Days) 1 A FACE TO FACE EVALUATION WAS PERFORMED  Ranelle OysterSWARTZ,Marjarie Irion T, MD 03/15/2016 9:46 AM

## 2016-03-15 NOTE — Plan of Care (Signed)
Problem: RH Balance Goal: LTG Patient will maintain dynamic standing balance (PT) LTG:  Patient will maintain dynamic standing balance with assistance during mobility activities (PT) With LRAD  Problem: RH Bed to Chair Transfers Goal: LTG Patient will perform bed/chair transfers w/assist (PT) LTG: Patient will perform bed/chair transfers with assistance, with/without cues (PT). With LRAD  Problem: RH Car Transfers Goal: LTG Patient will perform car transfers with assist (PT) LTG: Patient will perform car transfers with assistance (PT). With LRAD  Problem: RH Furniture Transfers Goal: LTG Patient will perform furniture transfers w/assist (OT/PT LTG: Patient will perform furniture transfers  with assistance (OT/PT). With LRAD  Problem: RH Ambulation Goal: LTG Patient will ambulate in controlled environment (PT) LTG: Patient will ambulate in a controlled environment, # of feet with assistance (PT). 150 ft with LRAD Goal: LTG Patient will ambulate in home environment (PT) LTG: Patient will ambulate in home environment, # of feet with assistance (PT). 50 ft with LRAD  Problem: RH Wheelchair Mobility Goal: LTG Patient will propel w/c in controlled environment (PT) LTG: Patient will propel wheelchair in controlled environment, # of feet with assist (PT) 100 ft  Problem: RH Stairs Goal: LTG Patient will ambulate up and down stairs w/assist (PT) LTG: Patient will ambulate up and down # of stairs with assistance (PT) Single step (~3 inches) with LRAD for home access  Problem: RH Other (Specify) Goal: RH LTG Other (Specify)1 Pt will negotiate 12 steps in gym with PT only for strengthening purposes.

## 2016-03-15 NOTE — Progress Notes (Signed)
Slept good. Using condom cath to manage incontinence. Wife at bedside, limited [place]. Alfredo MartinezMurray, Cassandre Oleksy A

## 2016-03-15 NOTE — Evaluation (Signed)
Physical Therapy Assessment and Plan  Patient Details  Name: Grant Garcia MRN: [id number] Date of Birth: 07/13/1965  PT Diagnosis: Abnormality of gait, Difficulty walking, Hemiplegia non-dominant, Impaired cognition and Muscle weakness Rehab Potential: Good ELOS: 2-3 weeks   Today's Date: 03/15/2016 PT Individual Time: 0803-0902 PT Individual Time Calculation (min): 59 min    Problem List:  Patient Active Problem List   Diagnosis Date Noted  . 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 Smoker   . Hyperlipidemia   . CVA (cerebral vascular accident) ([place]) 03/11/2016  . Respiratory failure [facility])     Past Medical History:  Past Medical History:  Diagnosis Date  . Medical history non-contributory    Past Surgical History:  Past Surgical History:  Procedure Laterality Date  . IR GENERIC HISTORICAL  03/11/2016   IR ANGIO VERTEBRAL SEL VERTEBRAL UNI R MOD SED 03/11/2016 Grant Mckusick, DO [facility]-INTERV RAD  . IR GENERIC HISTORICAL  03/11/2016   IR ANGIO INTRA EXTRACRAN SEL COM CAROTID INNOMINATE UNI L MOD SED 03/11/2016 Grant Mckusick, DO [facility]  . IR GENERIC HISTORICAL  03/11/2016   IR PERCUTANEOUS ART THROMBECTOMY/INFUSION INTRACRANIAL INC DIAG ANGIO 03/11/2016 Grant Mckusick, DO [facility]  . IR GENERIC HISTORICAL  03/11/2016   IR US GUIDE VASC ACCESS RIGHT 03/11/2016 Grant Mckusick, DO [facility]  . RADIOLOGY WITH ANESTHESIA N/A 03/11/2016   Procedure: RADIOLOGY WITH ANESTHESIA;  Surgeon: Medication Radiologist, MD;  Location: [place];  Service: Radiology;  Laterality: N/A;    Assessment & Plan Clinical Impression: Patient is a 51 y.o. year old male, right handed, Spanish speaking maleon no prescription medications with history of tobacco abuse. Per chart review patient independent prior to admission living with family. 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 Presented to [facility] with left-sided weakness. Patient was transferred to [facility] for further evaluation. CT of the head showed evolving right MCA infarct as well as emergency large vessel occlusion on the right MCAper MRA.Marland KitchenMRI showed acute patchy multifocal right MCA territory infarct. Patient did not receive TPA. Neuro interventional radiology consulted underwent thrombectomy. Echocardiogram with ejection fraction of 55% no wall motion abnormalities. Neurology consulted currently on Plavix and aspirin for CVA prophylaxis and workup ongoing. Subcutaneous Lovenox for DVT prophylaxis. Dysphagia #1 thinliquid diet. Physical and occupational therapy evaluations completed 03/12/2016 with recommendations of physical medicine rehabilitation consult. Patient was admitted for a comprehensive rehabilitation program.  Patient transferred to CIR on 03/14/2016 .   Patient currently requires mod with mobility secondary to muscle weakness, decreased cardiorespiratoy endurance, decreased coordination, decreased attention to left, decreased attention, decreased awareness, decreased problem solving and decreased safety awareness, and decreased sitting balance, decreased standing balance, decreased postural control, hemiplegia and decreased balance strategies.  Prior to hospitalization, patient was independent  with mobility and lived with [name].  Home access is   (single threshold step to enter).  Patient will benefit from skilled PT intervention to maximize safe functional mobility, minimize fall risk and decrease caregiver burden for planned discharge home with 24 hour supervision.  Anticipate patient will HHPT vs OPPT at discharge.  PT - End of Session Activity Tolerance: Tolerates 30+ min activity with multiple rests Endurance Deficit: Yes Endurance Deficit Description: 2/2 fatigue, reduced cardiopulmonary endurance PT Assessment Rehab Potential (ACUTE/IP ONLY): Good PT Patient demonstrates impairments in the following area(s):  Balance;Behavior;Endurance;Motor;Perception;Safety;Sensory PT Transfers Functional Problem(s): Bed Mobility;Bed to Chair;Car;Furniture PT Locomotion Functional Problem(s): Ambulation;Wheelchair Mobility;Stairs PT Plan PT Intensity: Minimum  of 1-2 x/day ,45 to 90 minutes PT Frequency: 5 out of 7 days PT Duration Estimated Length of Stay: 2-3 weeks PT Treatment/Interventions: Ambulation/gait training;Community reintegration;DME/adaptive equipment instruction;Neuromuscular re-education;Psychosocial support;Stair training;UE/LE Strength taining/ROM;Wheelchair propulsion/positioning;UE/LE Coordination activities;Therapeutic Activities;Pain management;Functional electrical stimulation;Discharge planning;Balance/vestibular training;Cognitive remediation/compensation;Disease management/prevention;Functional mobility training;Patient/family education;Splinting/orthotics;Therapeutic Exercise;Visual/perceptual remediation/compensation PT Transfers Anticipated Outcome(s): supervision with LRAD PT Locomotion Anticipated Outcome(s): supervision with LRAD PT Recommendation Recommendations for Other Services: Speech consult;Therapeutic Recreation consult Therapeutic Recreation Interventions: Outing/community reintergration;Stress management;Pet therapy Follow Up Recommendations: 24 hour supervision/assistance (HHPT vs OPPT) Patient destination: Home Equipment Recommended: To be determined  Skilled Therapeutic Intervention Pt received in bed with wife Grant Garcia) & professional interpreter present. Pt denied c/o pain. PT evaluation initiated with therapist educating pt & wife on ELOS, weekly interdisciplinary team meetings, daily therapy schedules and safety plan while in CIR. Provided pt with L half-lap tray & cushion for increased comfort while sitting in w/c. Pt completed supine>sit, sit<>stand, stand pivot, and squat pivot transfers with mod assist overall. Pt requires max cuing for sequencing, technique, & safety.  Pt propelled w/c x 100 ft with R hemi technique & mod assist to maintain linear path; pt with impaired coordination of RUE/LE during task. Pt ambulated 20 ft with +2 assist (max assist + w/c follow for safety). Pt was able to activate quads in LLE to initiate advancement but required physical assist for placement and stabilization as pt is prone to L knee buckling. Pt also pushing left during gait despite multimodal cuing to correct and mirror for visual feedback. At end of session pt left sitting in w/c in room with wife & interpreter present & all needs within reach; OT entering room.  Pt able to demonstrate proper use of call bell. Pt with difficulty managing secretions during session.   PT Evaluation Precautions/Restrictions Precautions Precautions: Fall Precaution Comments: L side weakness Restrictions Weight Bearing Restrictions: No  General Chart Reviewed: Yes Additional Pertinent History: hx of R epidural hematoma, s/p crani for evacuation, R wrist fx, multiple facial fx, orbit fx 2/2 fall PT Missed Treatment Reason: Not applicable Response to Previous Treatment: Not applicable Family/Caregiver Present: Yes (wife - [name])   Vital Signs Therapy Vitals Pulse Rate: 64 BP: 137/85 Patient Position (if appropriate): Sitting Oxygen Therapy SpO2: 97 % O2 Device: Not Delivered Pulse Oximetry Type: Intermittent  Pain Pain Assessment Pain Assessment: No/denies pain  Home Living/Prior Functioning Home Living Available Help at Discharge: Family;Available 24 hours/day Type of Home: House Home Access:  (single threshold step to enter) Home Layout: One level (with basement but it is not currently used) Bathroom Shower/Tub: Health visitor: Standard Bathroom Accessibility: Yes  Lives With: Spouse Prior Function Level of Independence: Independent with basic ADLs;Independent with gait;Independent with transfers  Able to Take Stairs?: Yes Driving: Yes Vocation: Full  time employment Vocation Requirements: works as a Administrator Leisure: Hobbies-yes (Comment) Comments: gardening at home  Vision/Perception  Pt with L inattention.  Cognition Overall Cognitive Status: Impaired/Different from baseline Arousal/Alertness: Awake/alert Orientation Level: Oriented X4 (oriented to month & year) Attention: Sustained Sustained Attention: Appears intact Problem Solving: Impaired Problem Solving Impairment: Functional basic Behaviors: Impulsive Safety/Judgment: Impaired  Sensation Sensation Light Touch: Appears Intact (BLE, per pt report) Proprioception: Impaired by gross assessment Coordination Gross Motor Movements are Fluid and Coordinated: No (LLE) Fine Motor Movements are Fluid and Coordinated: No (LLE)  Motor  Motor Motor: Hemiplegia (L UE/LE) Motor - Skilled Clinical Observations: general weakness   Mobility Bed Mobility Bed Mobility: Supine to Sit Supine to Sit:  3: Mod assist;With rails Supine to Sit Details: Tactile cues for initiation;Tactile cues for posture;Tactile cues for sequencing;Tactile cues for placement;Tactile cues for weight shifting;Visual cues/gestures for sequencing;Verbal cues for sequencing;Visual cues for safe use of DME/AE;Verbal cues for technique;Verbal cues for safe use of DME/AE;Manual facilitation for placement;Manual facilitation for weight bearing Transfers Transfers: Yes Sit to Stand: 3: Mod assist;With armrests Sit to Stand Details: Tactile cues for initiation;Tactile cues for posture;Verbal cues for sequencing;Verbal cues for technique;Manual facilitation for weight shifting;Tactile cues for placement;Tactile cues for sequencing;Tactile cues for weight shifting;Verbal cues for precautions/safety Stand Pivot Transfers: 3: Mod assist;With armrests Stand Pivot Transfer Details: Tactile cues for initiation;Tactile cues for posture;Verbal cues for sequencing;Manual facilitation for placement;Verbal cues for  technique;Tactile cues for placement;Tactile cues for sequencing;Verbal cues for precautions/safety;Tactile cues for weight beaing;Tactile cues for weight shifting;Manual facilitation for weight shifting Squat Pivot Transfers: 3: Mod assist Squat Pivot Transfer Details: Tactile cues for initiation;Tactile cues for posture;Tactile cues for sequencing;Tactile cues for placement;Tactile cues for weight shifting;Visual cues for safe use of DME/AE;Verbal cues for sequencing;Verbal cues for technique;Verbal cues for precautions/safety;Manual facilitation for weight shifting;Manual facilitation for placement  Locomotion  Ambulation Ambulation: Yes Ambulation/Gait Assistance: 1: +2 Total assist (max assist + w/c follow for safety) Ambulation Distance (Feet): 20 Feet Assistive device: Other (Comment) (rail in hallway) Ambulation/Gait Assistance Details: Tactile cues for initiation;Tactile cues for posture;Tactile cues for sequencing;Tactile cues for placement;Tactile cues for weight shifting;Verbal cues for precautions/safety;Verbal cues for technique;Verbal cues for sequencing;Verbal cues for gait pattern;Manual facilitation for weight shifting;Manual facilitation for placement Gait Gait: Yes Gait Pattern:  (decreased weight shifting L/R, able to initiate advancement of LLE but requires assistance for placement & stabilization, pushes L) Wheelchair Mobility Wheelchair Mobility: Yes Wheelchair Assistance: 3: Mod assist Wheelchair Assistance Details: Verbal cues for sequencing;Verbal cues for technique;Visual cues/gestures for sequencing Wheelchair Propulsion: Right upper extremity;Right lower extremity Wheelchair Parts Management: Needs assistance Distance: 100 ft   Trunk/Postural Assessment  Cervical Assessment Cervical Assessment:  (forward head) Postural Control Postural Control:  (poor head, trunk control)   Balance Balance Balance Assessed: Yes Static Sitting Balance Static Sitting -  Balance Support: Right upper extremity supported;Feet supported Static Sitting - Level of Assistance: 3: Mod assist Static Standing Balance Static Standing - Balance Support: No upper extremity supported;During functional activity Static Standing - Level of Assistance: 3: Mod assist Static Standing - Comment/# of Minutes: 1  Extremity Assessment  RUE Assessment RUE Assessment: Within Functional Limits LUE Assessment LUE Assessment:  (minimal shoulder muscle activation) RLE Assessment RLE Assessment: Within Functional Limits LLE Assessment LLE Assessment: Exceptions to [facility] (able to activate quads during gait but minimal muscle activation noted in knee extensors & dorsiflexors)   See Function Navigator for Current Functional Status.   Refer to Care Plan for Long Term Goals  Recommendations for other services: Therapeutic Recreation  Pet therapy, Stress management and Outing/community reintegration  Discharge Criteria: Patient will be discharged from PT if patient refuses treatment 3 consecutive times without medical reason, if treatment goals not met, if there is a change in medical status, if patient makes no progress towards goals or if patient is discharged from hospital.  The above assessment, treatment plan, treatment alternatives and goals were discussed and mutually agreed upon: by patient and by family  [place] 03/15/2016, 12:21 PM

## 2016-03-16 ENCOUNTER — Inpatient Hospital Stay (HOSPITAL_COMMUNITY): Payer: Self-pay | Admitting: Speech Pathology

## 2016-03-16 ENCOUNTER — Inpatient Hospital Stay (HOSPITAL_COMMUNITY): Payer: Self-pay | Admitting: Physical Therapy

## 2016-03-16 ENCOUNTER — Inpatient Hospital Stay (HOSPITAL_COMMUNITY): Payer: Self-pay | Admitting: Occupational Therapy

## 2016-03-16 LAB — CBC WITH DIFFERENTIAL/PLATELET
Basophils Absolute: 0 10*3/uL (ref 0.0–0.1)
Basophils Relative: 0 %
EOS ABS: 0.3 10*3/uL (ref 0.0–0.7)
EOS PCT: 3 %
HCT: 41.5 % (ref 39.0–52.0)
HEMOGLOBIN: 14 g/dL (ref 13.0–17.0)
LYMPHS PCT: 24 %
Lymphs Abs: 2 10*3/uL (ref 0.7–4.0)
MCH: 28.3 pg (ref 26.0–34.0)
MCHC: 33.7 g/dL (ref 30.0–36.0)
MCV: 83.8 fL (ref 78.0–100.0)
Monocytes Absolute: 0.6 10*3/uL (ref 0.1–1.0)
Monocytes Relative: 7 %
NEUTROS PCT: 66 %
Neutro Abs: 5.6 10*3/uL (ref 1.7–7.7)
PLATELETS: 162 10*3/uL (ref 150–400)
RBC: 4.95 MIL/uL (ref 4.22–5.81)
RDW: 13.2 % (ref 11.5–15.5)
WBC: 8.5 10*3/uL (ref 4.0–10.5)

## 2016-03-16 LAB — COMPREHENSIVE METABOLIC PANEL
ALT: 27 U/L (ref 17–63)
AST: 26 U/L (ref 15–41)
Albumin: 3.5 g/dL (ref 3.5–5.0)
Alkaline Phosphatase: 53 U/L (ref 38–126)
Anion gap: 8 (ref 5–15)
BUN: 19 mg/dL (ref 6–20)
CHLORIDE: 103 mmol/L (ref 101–111)
CO2: 25 mmol/L (ref 22–32)
CREATININE: 0.73 mg/dL (ref 0.61–1.24)
Calcium: 9.3 mg/dL (ref 8.9–10.3)
GFR calc Af Amer: >60 mL/min (ref >60)
GFR calc non Af Amer: >60 mL/min (ref >60)
GLUCOSE: 114 mg/dL — AB (ref 65–99)
Potassium: 3.9 mmol/L (ref 3.5–5.1)
SODIUM: 136 mmol/L (ref 135–145)
Total Bilirubin: 1.3 mg/dL — ABNORMAL HIGH (ref 0.3–1.2)
Total Protein: 6.8 g/dL (ref 6.5–8.1)

## 2016-03-16 MED ORDER — PNEUMOCOCCAL VAC POLYVALENT 25 MCG/0.5ML IJ INJ
0.5000 mL | INJECTION | INTRAMUSCULAR | Status: AC
  Administered 2016-03-17: 0.5 mL via INTRAMUSCULAR
  Filled 2016-03-16: qty 0.5

## 2016-03-16 MED ORDER — INFLUENZA VAC SPLIT QUAD 0.5 ML IM SUSY
0.5000 mL | PREFILLED_SYRINGE | INTRAMUSCULAR | Status: AC
  Administered 2016-03-17: 0.5 mL via INTRAMUSCULAR

## 2016-03-16 NOTE — Evaluation (Signed)
Speech Language Pathology Assessment and Plan  Patient Details  Name: Shivam Mestas MRN: [id number] Date of Birth: 1965-02-02  SLP Diagnosis: Dysarthria;Cognitive Impairments;Dysphagia  Rehab Potential: Good ELOS: 3 weeks     Today's Date: 03/16/2016 SLP Individual Time: 4765-4650 SLP Individual Time Calculation (min): 60 min   Problem List:  Patient Active Problem List   Diagnosis Date Noted  . 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 Smoker   . Hyperlipidemia   . CVA (cerebral vascular accident) ([place]) 03/11/2016  . Respiratory failure [facility])    Past Medical History:  Past Medical History:  Diagnosis Date  . Medical history non-contributory    Past Surgical History:  Past Surgical History:  Procedure Laterality Date  . IR GENERIC HISTORICAL  03/11/2016   IR ANGIO VERTEBRAL SEL VERTEBRAL UNI R MOD SED 03/11/2016 [name], DO [facility]  . IR GENERIC HISTORICAL  03/11/2016   IR ANGIO INTRA EXTRACRAN SEL COM CAROTID INNOMINATE UNI L MOD SED 03/11/2016 [name], DO [facility]  . IR GENERIC HISTORICAL  03/11/2016   IR PERCUTANEOUS ART THROMBECTOMY/INFUSION INTRACRANIAL INC DIAG ANGIO 03/11/2016 [name], DO [facility]  . IR GENERIC HISTORICAL  03/11/2016   IR US GUIDE VASC ACCESS RIGHT 03/11/2016 [name], DO [facility]  . RADIOLOGY WITH ANESTHESIA N/A 03/11/2016   Procedure: RADIOLOGY WITH ANESTHESIA;  Surgeon: Medication Radiologist, MD;  Location: [place];  Service: Radiology;  Laterality: N/A;    Assessment / Plan / Recommendation Clinical Impression Kem Martinezis a 51 y.o.right handed Spanish speaking maleon no prescription medications with history of tobacco abuse. Per chart review patient independent prior to admission living with family. 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 Presented to [facility] with left-sided weakness. Patient was transferred to [facility] for further evaluation. CT of  the head showed evolving right MCA infarct as well as emergency large vessel occlusion on the right MCAper MRA.Marland KitchenMRI showed acute patchy multifocal right MCA territory infarct. Patient did not receive TPA. Neuro interventional radiology consulted underwent thrombectomy. Echocardiogram with ejection fraction of 55% no wall motion abnormalities. Neurology consulted currently on Plavix and aspirin for CVA prophylaxis and workup ongoing. Subcutaneous Lovenox for DVT prophylaxis. Dysphagia 1 and thinliquid diet initiated. Physicaland occupationaltherapy evaluationscompleted 03/12/2016 with recommendations of physical medicine rehabilitation consult.Patient was admitted for a comprehensive rehabilitation program 03/14/16. Order for cognitive-linguistic and bedside swallow were received and completed 03/16/16.    Interpreter, Marcello Moores presents to assist in administering the [employer], Spanish version.  Patient demonstrated skills consistent with a score of 22/30 +1 for having less than 12 years of education; 23/30 with 26 or greater being considered to be [facility].  Patient demonstrates mild-moderate cognitive impairments characterized by left inattention, slowed processing, impulsivity, and impaired recall of new information, which impact the patient's overall safety with functional self-care tasks. Patient also demonstrates significant left sided oral weakness impacting speech intelligibility as well as oral management of solid and liquid consistencies.  Patient would benefit from skilled SLP intervention in order to maximize his functional independence prior to discharge. Anticipate patient will require 24 hour supervision at home and follow up SLP services.    Skilled Therapeutic Interventions          Cognitive-linguistic and bedside swallow evaluations completed with results and recommendations reviewed with patient and family.    SLP Assessment  Patient will need skilled Speech Lanaguage  Pathology Services during CIR admission    Recommendations  SLP Diet Recommendations:  Dysphagia 1 (Puree);Thin Liquid Administration via: Straw Medication Administration: Whole meds with puree Supervision: Full supervision/cueing for compensatory strategies Compensations: Slow rate;Small sips/bites;Monitor for anterior loss;Lingual sweep for clearance of pocketing Postural Changes and/or Swallow Maneuvers: Seated upright 90 degrees Oral Care Recommendations: Oral care BID Patient destination: Home Follow up Recommendations: 24 hour supervision/assistance;Home Health SLP;Outpatient SLP Equipment Recommended: To be determined    SLP Frequency 3 to 5 out of 7 days   SLP Duration  SLP Intensity  SLP Treatment/Interventions 3 weeks   Minumum of 1-2 x/day, 30 to 90 minutes  Cognitive remediation/compensation;Cueing hierarchy;Dysphagia/aspiration precaution training;Environmental controls;Functional tasks;Internal/external aids;Patient/family education;Speech/Language facilitation    Pain No/None  Prior Functioning WFL   Function:  Eating Eating   Modified Consistency Diet: Yes Eating Assist Level: Set up assist for;Supervision or verbal cues;Helper checks for pocketed food           Cognition Comprehension Comprehension assist level: Understands complex 90% of the time/cues 10% of the time  Expression   Expression assist level: Expresses basic needs/ideas: With extra time/assistive device  Social Interaction Social Interaction assist level: Interacts appropriately 75 - 89% of the time - Needs redirection for appropriate language or to initiate interaction.  Problem Solving Problem solving assist level: Solves basic 50 - 74% of the time/requires cueing 25 - 49% of the time  Memory Memory assist level: Recognizes or recalls 50 - 74% of the time/requires cueing 25 - 49% of the time   Short Term Goals: Week 1: SLP Short Term Goal 1 (Week 1): Patient will demonstrate use of  lingual sweep and oral clearance of Dys.1 textures and thin liquids with Supervision level verbal cues. SLP Short Term Goal 2 (Week 1): Patient will demonstrate effective masticaiton and oral clearance of Dys.2 textures with Min verbal cues over two sessions prior to upgrade.  SLP Short Term Goal 3 (Week 1): Patient will attend to left upper extremity and to left of midline with Mod assist multimodal cues during functional tasks. SLP Short Term Goal 4 (Week 1): Patient will solve problem realted to self-care tasks with Mod assist multimodal cues for self-monitoring and correcting. SLP Short Term Goal 5 (Week 1): Patient will utilize over articulation at the phrase-sentence level of verbal expression with Min assist verbal and non-verbal cues.   Refer to Care Plan for Long Term Goals  Recommendations for other services: None   Discharge Criteria: Patient will be discharged from SLP if patient refuses treatment 3 consecutive times without medical reason, if treatment goals not met, if there is a change in medical status, if patient makes no progress towards goals or if patient is discharged from hospital.  The above assessment, treatment plan, treatment alternatives and goals were discussed and mutually agreed upon: by patient and by family  Carmelia Roller., [place]  [place] 03/16/2016, 1:02 PM

## 2016-03-16 NOTE — Progress Notes (Signed)
Occupational Therapy Session Note  Patient Details  Name: Grant Garcia MRN: [id number] Date of Birth: [birth year]  Today's Date: 03/16/2016 OT Individual Time: 1000-1115 OT Individual Time Calculation (min): 75 min    Short Term Goals: Week 1:  OT Short Term Goal 1 (Week 1): Pt will complete upper body dressing with min assist OT Short Term Goal 2 (Week 1): Pt will complete lower body dressing, sitting and standing with mod assist OT Short Term Goal 3 (Week 1): Pt will manage LUE during performance of BADL with min vc OT Short Term Goal 4 (Week 1): Pt will complete transfer to toilet with steadying assist Week 2:     Skilled Therapeutic Interventions/Progress Updates:   Session completed with interpreter and daughter  present.  Traansfered to TTB with max assist.  Provided physical cues for upright posture and weight bearing through LUE.    Wash mit donned on LUE hand over hand assist.  When not using wash mit, attempted hand-over-hand assist to wash his left leg and chest using wash mit during bathing. Pt needed cues to slow down movements and let pt know when he was going to stand.  Pt. Transferred back to wc and dressed at sink in wc.  He performed sit to stand with mod assist.  Pt completed grooming at sink (teeth) with SBA.  Left pt in wc with daughter in room.    Therapy Documentation Precautions:  Precautions Precautions: Fall Precaution Comments: L side weakness Restrictions Weight Bearing Restrictions: No General:   Vital Signs: Therapy Vitals Temp: 98.9 F (37.2 C) Temp Source: Oral Pulse Rate: (!) 54 Resp: 17 BP: 121/75 Patient Position (if appropriate): Sitting Oxygen Therapy SpO2: 96 % O2 Device: Not Delivered Pain:  none   ADL: ADL ADL Comments: see Functional Assessment Tool    Other Treatments:    See Function Navigator for Current Functional Status.   Therapy/Group: Individual Therapy  Grant Garcia, Grant Garcia 03/16/2016, 4:27 PM

## 2016-03-16 NOTE — Plan of Care (Signed)
Problem: RH BLADDER ELIMINATION Goal: RH STG MANAGE BLADDER WITH ASSISTANCE STG Manage Bladder With mod Assistance   Outcome: Not Progressing Incontinent-total assist.   

## 2016-03-16 NOTE — Progress Notes (Signed)
Physical Therapy Session Note  Patient Details  Name: Grant Garcia MRN: [id number] Date of Birth: 11-13-1965  Today's Date: 03/16/2016 PT Individual Time: 1300-1415 PT Individual Time Calculation (min): 75 min   Short Term Goals: Week 1:  PT Short Term Goal 1 (Week 1): Pt will complete bed mobility with min assist. PT Short Term Goal 2 (Week 1): Pt will complete basic functional transfers with min assist. PT Short Term Goal 3 (Week 1): Pt will ambulate 50 ft with LRAD & min assist.  Skilled Therapeutic Interventions/Progress Updates:    no c/o pain.  Session focus on w/c positioning and propulsion, transfers, NMR, attention to task, visual scanning, and L attention.    Pt propelled w/c to therapy gym with R hemi technique, increased time, and min assist to maintain straight pathway.  Stand/pivot x3 throughout session from a variety of surfaces with mod assist overall to control pivot and with max multimodal cues for safe set up prior to initiated transfer.  Pt performed nustep x15 minutes with BLEs and RUE for reciprocal stepping pattern retraining, forced use, activity tolerance, and attention to task.  Pt completed dynavision mode A x2 trials (30 seconds and 5 minutes) for visual scanning, L attention, attention to task, and pt self selected AAROM for LUE.  PT provided pt with 16x16 w/c and adjusted seat>floor height and casters to improve pt safety, sitting tolerance, and independence with w/c propulsion.  Pt returned to room at end of session and positioned upright in w/c with QRB in place, call bell in reach, and needs met.  Daughter present in room and agrees to call for nursing staff to transfer pt back to bed when ready.    Therapy Documentation Precautions:  Precautions Precautions: Fall Precaution Comments: L side weakness Restrictions Weight Bearing Restrictions: No   See Function Navigator for Current Functional Status.   Therapy/Group: Individual Therapy  Earnest Conroy  Penven-Crew 03/16/2016, 2:20 PM

## 2016-03-16 NOTE — Progress Notes (Signed)
[facility] PHYSICAL MEDICINE & REHABILITATION     PROGRESS NOTE    Subjective/Complaints: Interpreter at bedside No issues overnite  Basic ROS without issues/limited due to language  Objective: Vital Signs: Blood pressure (!) 124/58, pulse (!) 50, temperature 98 F (36.7 C), temperature source Oral, resp. rate 18, height 5\' 6"  (1.676 m), weight 74.6 kg (164 lb 6.4 oz), SpO2 99 %. No results found.  Recent Labs  03/14/16 0628 03/16/16 0701  WBC 7.9 8.5  HGB 13.7 14.0  HCT 41.0 41.5  PLT 142* 162    Recent Labs  03/14/16 0628 03/16/16 0701  NA 140 136  K 3.8 3.9  CL 107 103  GLUCOSE 104* 114*  BUN 9 19  CREATININE 0.60* 0.73  CALCIUM 9.1 9.3   CBG (last 3)  No results for input(s): GLUCAP in the last 72 hours.  Wt Readings from Last 3 Encounters:  03/14/16 74.6 kg (164 lb 6.4 oz)  03/13/16 73.6 kg (162 lb 4.1 oz)    Physical Exam:  Constitutional: He appears well-developed. No distress.  HENT:  Head: Normocephalicand atraumatic.  Eyes: EOMare normal. Left eye exhibits no discharge.  Neck: Normal range of motion. Neck supple. No JVDpresent. No thyromegalypresent.  Cardiovascular: RRR No murmurheard. Respiratory:CTA without wheezes GI: Soft. Bowel sounds are normal. He exhibits no distension. There is no tenderness. There is no rebound.  Musculoskeletal: Normal range of motion.  Skin: Skin is warmand dry. He is not diaphoretic.  Skin. Warm and dry Neurological:   Speech sl dysarthric. Able to follow basic commands when spoken in Spanish. RUE and RLE motor 5/5. LUE remain0/5 prox to distal with mildflexor tone pattern LUE.LLE 2+ hip /knee extension  Left inattention.Decreased sensation to LT/withdrawl to pain in LLE and withdraws to pinch. Left central 7. Fair insight and awareness.   Assessment/Plan: 1. Functional and mobility deficits secondary to right MCA infarct which require 3+ hours per day of interdisciplinary therapy in a comprehensive  inpatient rehab setting. Physiatrist is providing close team supervision and 24 hour management of active medical problems listed below. Physiatrist and rehab team continue to assess barriers to discharge/monitor patient progress toward functional and medical goals.  Function:  Bathing Bathing position   Position: Shower  Bathing parts Body parts bathed by patient: Right arm, Chest, Abdomen, Front perineal area, Right upper leg, Left upper leg Body parts bathed by helper: Left arm, Buttocks, Right lower leg, Left lower leg, Back  Bathing assist Assist Level:  (Mod assist)      Upper Body Dressing/Undressing Upper body dressing   What is the patient wearing?: Hospital gown                Upper body assist Assist Level: Touching or steadying assistance(Pt > 75%)      Lower Body Dressing/Undressing Lower body dressing   What is the patient wearing?: Non-skid slipper socks, Hospital Gown           Non-skid slipper socks- Performed by helper: Don/doff right sock, Don/doff left sock                  Lower body assist        Toileting Toileting Toileting activity did not occur: No continent bowel/bladder event        Toileting assist     Transfers Chair/bed transfer   Chair/bed transfer method: Squat pivot Chair/bed transfer assist level: Moderate assist (Pt 50 - 74%/lift or lower) Chair/bed transfer assistive device: Armrests  Locomotion Ambulation     Max distance: 20 ft Assist level:  (max assist + w/c follow for safety)   Wheelchair   Type: Manual Max wheelchair distance: 100 ft Assist Level: Moderate assistance (Pt 50 - 74%)  Cognition Comprehension Comprehension assist level: Follows basic conversation/direction with extra time/assistive device  Expression Expression assist level:  (difficult to assess R/T Spanish speaking)  Social Interaction Social Interaction assist level: Interacts appropriately 50 - 74% of the time - May be physically  or verbally inappropriate.  Problem Solving Problem solving assist level:  (difficult to assess)  Memory Memory assist level:  (difficult to assess R/T Spanish speaking)   Medical Problem List and Plan: 1. 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 DVT Prophylaxis/Anticoagulation: Subcutaneous Lovenox. Monitor platelet counts and any signs of bleeding 3. Pain Management: Tylenol as needed 4. Mood: Provide emotional support 5. Neuropsych: This patient iscapable of making decisions on hisown behalf. 6. Skin/Wound Care: Routine skin checks 7. Fluids/Electrolytes/Nutrition: Routine I&O with follow-up chemistries -encourage PO 8.Dysphagia. Dysphagia #1 thin liquid diet. Monitor for any aspiration. Advance per speech therapy, still coughing and having problem with saliva 9.Hyperlipidemia. Lipitor 10.Tobacco abuse. Counseling 11. Left neglect may do well with VR  LOS (Days) 2 A FACE TO FACE EVALUATION WAS PERFORMED  Erick ColaceKIRSTEINS,Duvid Smalls E, MD 03/16/2016 8:30 AM

## 2016-03-16 NOTE — Progress Notes (Signed)
Patient information reviewed and entered into eRehab system by Jaryn Hocutt, RN, CRRN, PPS Coordinator.  Information including medical coding and functional independence measure will be reviewed and updated through discharge.    

## 2016-03-16 NOTE — Plan of Care (Signed)
Problem: RH BOWEL ELIMINATION Goal: RH STG MANAGE BOWEL WITH ASSISTANCE STG Manage Bowel with mod Assistance.   Outcome: Not Progressing Incontinent-total assist   

## 2016-03-17 ENCOUNTER — Inpatient Hospital Stay (HOSPITAL_COMMUNITY): Payer: Self-pay | Admitting: Physical Therapy

## 2016-03-17 ENCOUNTER — Inpatient Hospital Stay (HOSPITAL_COMMUNITY): Payer: Self-pay | Admitting: Speech Pathology

## 2016-03-17 ENCOUNTER — Inpatient Hospital Stay (HOSPITAL_COMMUNITY): Payer: Self-pay | Admitting: Occupational Therapy

## 2016-03-17 DIAGNOSIS — I69391 Dysphagia following cerebral infarction: Secondary | ICD-10-CM

## 2016-03-17 NOTE — Progress Notes (Signed)
Occupational Therapy Session Note  Patient Details  Name: Grant Garcia MRN: [id number] Date of Birth: 1965-09-29  Today's Date: 03/17/2016 OT Individual Time: [id number] OT Individual Time Calculation (min): 60 min    Short Term Goals: Week 1:  OT Short Term Goal 1 (Week 1): Pt will complete upper body dressing with min assist OT Short Term Goal 2 (Week 1): Pt will complete lower body dressing, sitting and standing with mod assist OT Short Term Goal 3 (Week 1): Pt will manage LUE during performance of BADL with min vc OT Short Term Goal 4 (Week 1): Pt will complete transfer to toilet with steadying assist  Skilled Therapeutic Interventions/Progress Updates:    Treatment session with focus on ADL retraining with functional transfers, sit > stand, and hemi technique for bathing and dressing.  Pt in bed upon arrival reporting that he did not sleep well, discussed various options and encouraged pt to speak with MD and RN.  Completed squat pivot transfer bed > w/c > tub bench in room shower with mod assist.  Pt completed bathing with attempts to utilize LUE with hand over hand with use of wash mitt however with decreased motor control.  Multimodal cues to slow down as pt impulsive with sit <> stand and transfers.  Educated on hemi-technique with bathing and dressing, incorporating crossing LLE over Rt knee for washing and LB dressing.  Min assist sit > stand this session, pt able to pull pants over hips by reaching around his back with Rt hand and pulling over Lt hip.  Left upright at sink to complete oral care with wife and interpreter in room.  Therapy Documentation Precautions:  Precautions Precautions: Fall Precaution Comments: L side weakness Restrictions Weight Bearing Restrictions: No Pain:  Pt with no c/o pain  See Function Navigator for Current Functional Status.   Therapy/Group: Individual Therapy  [name], Jerid Catherman 03/17/2016, 12:07 PM

## 2016-03-17 NOTE — Plan of Care (Signed)
Problem: RH BLADDER ELIMINATION Goal: RH STG MANAGE BLADDER WITH ASSISTANCE STG Manage Bladder With mod Assistance  Outcome: Not Progressing Incontinent with staff this shift

## 2016-03-17 NOTE — Progress Notes (Signed)
Inpatient Rehabilitation Center Individual Statement of Services  Patient Name:  Grant Garcia  Date:  03/17/2016  Welcome to the Inpatient Rehabilitation Center.  Our goal is to provide you with an individualized program based on your diagnosis and situation, designed to meet your specific needs.  With this comprehensive rehabilitation program, you will be expected to participate in at least 3 hours of rehabilitation therapies Monday-Friday, with modified therapy programming on the weekends.  Your rehabilitation program will include the following services:  Physical Therapy (PT), Occupational Therapy (OT), Speech Therapy (ST), 24 hour per day rehabilitation nursing, Neuropsychology, Case Management (Social Worker), Rehabilitation Medicine, Nutrition Services and Pharmacy Services  Weekly team conferences will be held on Wednesdays to discuss your progress.  Your Social Worker will talk with you frequently to get your input and to update you on team discussions.  Team conferences with you and your family in attendance may also be held.  Expected length of stay:  2 to 3 weeks  Overall anticipated outcome:  Supervision with minimal assistance for bathing and lower body dressing  Depending on your progress and recovery, your program may change. Your Social Worker will coordinate services and will keep you informed of any changes. Your Social Worker's name and contact numbers are listed  below.  The following services may also be recommended but are not provided by the Inpatient Rehabilitation Center:   Driving Evaluations  Home Health Rehabiltiation Services  Outpatient Rehabilitation Services  Vocational Rehabilitation   Arrangements will be made to provide these services after discharge if needed.  Arrangements include referral to agencies that provide these services.  Your insurance has been verified to be:  Uninsured currently Your primary doctor is:  No primary care  physician  Pertinent information will be shared with your doctor and your insurance company.  Social Worker:  Staci AcostaJenny Alquan Morrish, LCSW  [phone] or [phone]  Information discussed with and copy given to patient by: Elvera LennoxPrevatt, Kurstyn Larios Capps, 03/17/2016, 12:50 PM

## 2016-03-17 NOTE — Progress Notes (Signed)
   03/17/16 2305  What Happened  Was fall witnessed? No  Was patient injured? No  Patient found on floor  Found by Staff-comment Frutoso Chase(Maziyah Vessel, RN)  Stated prior activity to/from bed, chair, or stretcher  Follow Up  MD notified Jacalyn LefevreEunice Thomas  Time MD notified [phone]  Family notified Yes-comment (Will notify spouse(stepped off floor))  Time family notified [phone]  Additional tests No  Progress note created (see row info) Yes  Adult Fall Risk Assessment  Risk Factor Category (scoring not indicated) Fall has occurred during this admission (document High fall risk);High fall risk per protocol (document High fall risk)  Patient's Fall Risk High Fall Risk (>13 points)  Adult Fall Risk Interventions  Required Bundle Interventions *See Row Information* High fall risk - low, moderate, and high requirements implemented  Additional Interventions Family Supervision;Individualized elimination schedule;Lap belt while in chair/wheelchair;PT/OT need assessed if change in mobility from baseline;Reorient/diversional activities with confused patients;Use of appropriate toileting equipment (bedpan, BSC, etc.)  Screening for Fall Injury Risk  Risk For Fall Injury- See Row Information  F;Nurse judgement  Vitals  Temp 97.7 F (36.5 C)  Temp Source Oral  BP 138/73  BP Location Right Arm  BP Method Automatic  Patient Position (if appropriate) Sitting  Pulse Rate 69  Pulse Rate Source Dinamap  Resp 20  Oxygen Therapy  SpO2 96 %  O2 Device Room Air  Pain Assessment  Pain Assessment No/denies pain (s/p fall)  Neurological  Neuro (WDL) X  Speech Slurred/Dysarthria;Delayed responses  L Hand Grip Absent   L Foot Dorsiflexion Absent  L Foot Plantar Flexion Absent  LUE Motor Response No movement to painful stimulus  LUE Motor Strength 0  LLE Motor Response No movement to painful stimulus  LLE Motor Strength 0  Musculoskeletal  Musculoskeletal (WDL) X  Assistive Device Wheelchair;BSC  Generalized Weakness  Yes  Weight Bearing Restrictions No  Integumentary  Integumentary (WDL) X  Skin Integrity Surgical Incision (see LDA)    Nurse sitting from across the patient's room in the hallway heard patient hollering for a name. Nurse went into the room to find patient laying on the floor next to his bed. Patient stated he fell trying to get up to use the bathroom. Patient denied hitting his head or any physical injuries. Neuro WNL. No new changes from shift assessment. VS stable. Patient x2 staff assisted from the ground to wheel chair with no difficulties. Patient toileted with tech in bathroom and assisted back to bed. Laying in bed with bed in lowest position with bed alarm on and call light within reach. Patient educated on importance of using call light for help and assistance. Patient nods in agreement. Wife was in room about 5 -10 minutes prior and had stepped out. Per patient, wife is coming back. Nurse will try to call wife or inform wife when wife returns to room. On call MD Jacalyn LefevreEunice Thomas notified of fall with no new orders. Continue to monitor for changes in condition.

## 2016-03-17 NOTE — Plan of Care (Signed)
Problem: RH PAIN MANAGEMENT Goal: RH STG PAIN MANAGED AT OR BELOW PT'S PAIN GOAL <2  Outcome: Progressing No c/o pain   

## 2016-03-17 NOTE — IPOC Note (Signed)
Overall Plan of Care [facility](IPOC) Patient Details Name: Grant Garcia MRN: [id number] DOB: 1965/12/04  Admitting Diagnosis: cva  Hospital Problems: Principal Problem:   Right middle cerebral artery stroke [facility](HCC)     Functional Problem List: Nursing Behavior, Nutrition, Bladder, Pain, Bowel, Perception, Safety, Endurance, Sensory, Medication Management, Skin Integrity, Motor  PT Balance, Behavior, Endurance, Motor, Perception, Safety, Sensory  OT Balance, Cognition, Endurance, Motor, Perception, Safety, Vision  SLP Cognition, Nutrition, Linguistic  TR         Basic ADL's: OT Eating, Grooming, Bathing, Dressing, Toileting     Advanced  ADL's: OT       Transfers: PT Bed Mobility, Bed to Chair, Car, Occupational psychologisturniture  OT Toilet, Research scientist (life sciences)Tub/Shower     Locomotion: PT Ambulation, Psychologist, prison and probation servicesWheelchair Mobility, Stairs     Additional Impairments: OT Fuctional Use of Upper Extremity  SLP Swallowing, Communication, Social Cognition expression Problem Solving, Memory, Attention, Awareness  TR      Anticipated Outcomes Item Anticipated Outcome  Self Feeding Setup, using AE prn  Swallowing  Supervision    Basic self-care  Min assist  Toileting  Supervision   Bathroom Transfers Min Assist  Bowel/Bladder  continent of bowel and bladder with min. assist at discharge  Transfers  supervision with LRAD  Locomotion  supervision with LRAD  Communication  Mod I   Cognition  Supervision with basic   Pain  pain <2 on pain scale  Safety/Judgment  free from falls or injuries while on rehab    Therapy Plan: PT Intensity: Minimum of 1-2 x/day ,45 to 90 minutes PT Frequency: 5 out of 7 days PT Duration Estimated Length of Stay: 2-3 weeks OT Intensity: Minimum of 1-2 x/day, 45 to 90 minutes OT Frequency: 5 out of 7 days OT Duration/Estimated Length of Stay: 3 weeks SLP Intensity: Minumum of 1-2 x/day, 30 to 90 minutes SLP Frequency: 3 to 5 out of 7 days SLP Duration/Estimated Length of Stay: 3 weeks         Team Interventions: Nursing Interventions Patient/Family Education, Bladder Management, Bowel Management, Disease Management/Prevention, Pain Management, Medication Management, Skin Care/Wound Management, Dysphagia/Aspiration Precaution Training, Discharge Planning, Psychosocial Support  PT interventions Ambulation/gait training, Community reintegration, DME/adaptive equipment instruction, Neuromuscular re-education, Psychosocial support, Stair training, UE/LE Strength taining/ROM, Wheelchair propulsion/positioning, UE/LE Coordination activities, Therapeutic Activities, Pain management, Functional electrical stimulation, Discharge planning, Warden/rangerBalance/vestibular training, Cognitive remediation/compensation, Disease management/prevention, Functional mobility training, Patient/family education, Splinting/orthotics, Therapeutic Exercise, Visual/perceptual remediation/compensation  OT Interventions Balance/vestibular training, Neuromuscular re-education, Discharge planning, DME/adaptive equipment instruction, Cognitive remediation/compensation, Therapeutic Activities, Therapeutic Exercise, UE/LE Coordination activities, Visual/perceptual remediation/compensation, Functional mobility training, Patient/family education, Self Care/advanced ADL retraining, Wheelchair propulsion/positioning, UE/LE Strength taining/ROM  SLP Interventions Cognitive remediation/compensation, Cueing hierarchy, Dysphagia/aspiration precaution training, Environmental controls, Functional tasks, Internal/external aids, Patient/family education, Speech/Language facilitation  TR Interventions    SW/CM Interventions Discharge Planning, Psychosocial Support, Patient/Family Education    Team Discharge Planning: Destination: PT-Home ,OT- Home , SLP-Home Projected Follow-up: PT-24 hour supervision/assistance (HHPT vs OPPT), OT-  None, SLP-24 hour supervision/assistance, Home Health SLP, Outpatient SLP Projected Equipment Needs: PT-To be  determined, OT- To be determined, SLP-To be determined Equipment Details: PT- , OT-  Patient/family involved in discharge planning: PT- Patient, Family member/caregiver,  OT-Patient, Family member/caregiver, SLP-Patient, Family member/caregiver  MD ELOS: 20-24d Medical Rehab Prognosis:  Good Assessment:  51 y.o.right handed Spanish speaking maleon no prescription medications with history of tobacco abuse. Per chart review patient independent prior to admission living with family. He works as a Administratorlandscaper. Family can provide assistance. Presented  to [facility] with left-sided weakness. Patient was transferred to [facility] for further evaluation. CT of the head showed evolving right MCA infarct as well as emergency large vessel occlusion on the right MCAper MRA.Marland KitchenMRI showed acute patchy multifocal right MCA territory infarct. Patient did not receive TPA. Neuro interventional radiology consulted underwent thrombectomy. Echocardiogram with ejection fraction of 55% no wall motion abnormalities. Neurology consulted currently on Plavix and aspirin for CVA prophylaxis and workup ongoing. Subcutaneous Lovenox for DVT prophylaxis. Dysphagia #1 thinliquid diet   Now requiring 24/7 Rehab RN,MD, as well as CIR level PT, OT and SLP.  Treatment team will focus on ADLs and mobility with goals set at [facility] A/Sup See Team Conference Notes for weekly updates to the plan of care

## 2016-03-17 NOTE — Progress Notes (Addendum)
[facility] PHYSICAL MEDICINE & REHABILITATION     PROGRESS NOTE    Subjective/Complaints: No issues overnite, wife at bedside  Basic ROS without issues/limited due to language  Objective: Vital Signs: Blood pressure 122/65, pulse (!) 55, temperature 97.4 F (36.3 C), temperature source Oral, resp. rate 18, height 5\' 6"  (1.676 m), weight 74.6 kg (164 lb 6.4 oz), SpO2 97 %. No results found.  Recent Labs  03/16/16 0701  WBC 8.5  HGB 14.0  HCT 41.5  PLT 162    Recent Labs  03/16/16 0701  NA 136  K 3.9  CL 103  GLUCOSE 114*  BUN 19  CREATININE 0.73  CALCIUM 9.3   CBG (last 3)  No results for input(s): GLUCAP in the last 72 hours.  Wt Readings from Last 3 Encounters:  03/14/16 74.6 kg (164 lb 6.4 oz)  03/13/16 73.6 kg (162 lb 4.1 oz)    Physical Exam:  Constitutional: He appears well-developed. No distress.  HENT:  Head: Normocephalicand atraumatic.  Eyes: EOMare normal. Left eye exhibits no discharge.  Neck: Normal range of motion. Neck supple. No JVDpresent. No thyromegalypresent.  Cardiovascular: RRR No murmurheard. Respiratory:CTA without wheezes GI: Soft. Bowel sounds are normal. He exhibits no distension. There is no tenderness. There is no rebound.  Musculoskeletal: Normal range of motion.  Skin: Skin is warmand dry. He is not diaphoretic.  Skin. Warm and dry Neurological:   Speech sl dysarthric. Able to follow basic commands when spoken in Spanish. RUE and RLE motor 5/5. LUE remain0/5 prox to distal with mildflexor tone pattern LUE.LLE 2+ hip /knee extension  Left inattention.Decreased sensation to LT/withdrawl to pain in LLE and withdraws to pinch. Left central 7. Fair insight and awareness.   Assessment/Plan: 1. Functional and mobility deficits secondary to right MCA infarct which require 3+ hours per day of interdisciplinary therapy in a comprehensive inpatient rehab setting. Physiatrist is providing close team supervision and 24 hour  management of active medical problems listed below. Physiatrist and rehab team continue to assess barriers to discharge/monitor patient progress toward functional and medical goals.  Function:  Bathing Bathing position   Position: Shower  Bathing parts Body parts bathed by patient: Right arm, Chest, Abdomen, Front perineal area, Right upper leg, Left upper leg Body parts bathed by helper: Left arm, Buttocks, Right lower leg, Left lower leg, Back  Bathing assist Assist Level: Touching or steadying assistance(Pt > 75%)      Upper Body Dressing/Undressing Upper body dressing   What is the patient wearing?: Pull over shirt/dress     Pull over shirt/dress - Perfomed by patient: Put head through opening, Pull shirt over trunk, Thread/unthread left sleeve Pull over shirt/dress - Perfomed by helper: Thread/unthread right sleeve        Upper body assist Assist Level: Touching or steadying assistance(Pt > 75%)      Lower Body Dressing/Undressing Lower body dressing   What is the patient wearing?: Socks, Shoes, Pants     Pants- Performed by patient: Thread/unthread left pants leg Pants- Performed by helper: Pull pants up/down, Thread/unthread right pants leg   Non-skid slipper socks- Performed by helper: Don/doff right sock, Don/doff left sock   Socks - Performed by helper: Don/doff right sock, Don/doff left sock Shoes - Performed by patient: Don/doff left shoe Shoes - Performed by helper: Don/doff right shoe          Lower body assist Assist for lower body dressing: Touching or steadying assistance (Pt > 75%)  Toileting Toileting Toileting activity did not occur: No continent bowel/bladder event        Toileting assist     Transfers Chair/bed transfer   Chair/bed transfer method: Stand pivot Chair/bed transfer assist level: Moderate assist (Pt 50 - 74%/lift or lower) Chair/bed transfer assistive device: Armrests     Locomotion Ambulation     Max distance: 20  ft Assist level:  (max assist + w/c follow for safety)   Wheelchair   Type: Manual Max wheelchair distance: 100 ft Assist Level: Moderate assistance (Pt 50 - 74%)  Cognition Comprehension Comprehension assist level: Understands complex 90% of the time/cues 10% of the time  Expression Expression assist level: Expresses basic needs/ideas: With extra time/assistive device  Social Interaction Social Interaction assist level: Interacts appropriately 75 - 89% of the time - Needs redirection for appropriate language or to initiate interaction.  Problem Solving Problem solving assist level: Solves basic 50 - 74% of the time/requires cueing 25 - 49% of the time  Memory Memory assist level: Recognizes or recalls 50 - 74% of the time/requires cueing 25 - 49% of the time   Medical Problem List and Plan: 1. 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 DVT Prophylaxis/Anticoagulation: Subcutaneous Lovenox. Monitor platelet counts and any signs of bleeding, PLT 162K on 2/19 3. Pain Management: Tylenol as needed 4. Mood: Provide emotional support 5. Neuropsych: This patient iscapable of making decisions on hisown behalf. 6. Skin/Wound Care: Routine skin checks 7. Fluids/Electrolytes/Nutrition: Routine I&O with follow-up chemistries -encourage PO- 25-80% meals recorded 8.Dysphagia. Dysphagia #1 thin liquid diet. Monitor for any aspiration. Advance per speech therapy, still coughing and having problem with saliva 9.Hyperlipidemia. Lipitor 10.Tobacco abuse. Counseling 11. Left neglect may do well with VR  LOS (Days) 3 A FACE TO FACE EVALUATION WAS PERFORMED  Erick Colace, MD 03/17/2016 8:07 AM

## 2016-03-17 NOTE — Progress Notes (Signed)
Physical Therapy Session Note  Patient Details  Name: Grant Garcia MRN: [id number] Date of Birth: [birth year]  Today's Date: 03/17/2016 PT Individual Time: 9396-8864 PT Individual Time Calculation (min): 72 min   Short Term Goals: Week 1:  PT Short Term Goal 1 (Week 1): Pt will complete bed mobility with min assist. PT Short Term Goal 2 (Week 1): Pt will complete basic functional transfers with min assist. PT Short Term Goal 3 (Week 1): Pt will ambulate 50 ft with LRAD & min assist.  Skilled Therapeutic Interventions/Progress Updates:    no c/o pain, session focus on attention to task, safety awareness, transfers, gait, stair negotiation and w/c propulsion.    Pt propels w/c to therapy gym with increased time and min assist with max verbal cues for hemi-technique to maintain straight pathway.  Pt transfers squat/stand pivot to R side throughout session with steady assist and multimodal cues for safety.  NMR in sitting focus on L hip flexion to tap toes on 2" step.  Pt able to complete 5 reps with great effort before muscle fatigued.  Gait training x40' with rail in hallway and max assist to maintain balance, advance/place/stabilize LLE, and mod multimodal cues for sequencing and upright posture.  Stair negotiation 2x4 steps with R ascending rail, no rest break between trials.  Pt requires max assist to advance/place/stabilize LLE and mod multimodal cues for sequencing.  Pt returned to room at end of session and positioned in supine with min assist, call bell in reach and needs met.   Therapy Documentation Precautions:  Precautions Precautions: Fall Precaution Comments: L side weakness Restrictions Weight Bearing Restrictions: No  See Function Navigator for Current Functional Status.   Therapy/Group: Individual Therapy  Earnest Conroy Penven-Crew 03/17/2016, 2:15 PM

## 2016-03-17 NOTE — Plan of Care (Signed)
Problem: RH SAFETY Goal: RH STG ADHERE TO SAFETY PRECAUTIONS W/ASSISTANCE/DEVICE STG Adhere to Safety Precautions With  Min. Assistance/Device.  Outcome: Not Progressing Requiring 2+ A for transfer

## 2016-03-17 NOTE — Progress Notes (Signed)
Speech Language Pathology Daily Session Note  Patient Details  Name: Grant Garcia MRN: [id number] Date of Birth: [birth year]  Today's Date: 03/17/2016 SLP Individual Time: 1000-1100 SLP Individual Time Calculation (min): 60 min  Short Term Goals: Week 1: SLP Short Term Goal 1 (Week 1): Patient will demonstrate use of lingual sweep and oral clearance of Dys.1 textures and thin liquids with Supervision level verbal cues. SLP Short Term Goal 2 (Week 1): Patient will demonstrate effective masticaiton and oral clearance of Dys.2 textures with Min verbal cues over two sessions prior to upgrade.  SLP Short Term Goal 3 (Week 1): Patient will attend to left upper extremity and to left of midline with Mod assist multimodal cues during functional tasks. SLP Short Term Goal 4 (Week 1): Patient will solve problem realted to self-care tasks with Mod assist multimodal cues for self-monitoring and correcting. SLP Short Term Goal 5 (Week 1): Patient will utilize over articulation at the phrase-sentence level of verbal expression with Min assist verbal and non-verbal cues.   Skilled Therapeutic Interventions: Skilled treatment session focused on dysphagia and cognitive goals. SLP facilitated session by providing skilled observation with trials of Dys. 2 textures. Patient demonstrated efficient mastication with minimal oral residue without overt s/s of aspiration. Recommend trial tray prior to upgrade. Patient required Max verbal cues to self-monitor and correct large amounts of left anterior spillage of saliva and Min A verbal cues for functional problem solving during a basic money management task. Patient independently identified physical deficits but required total A to identify cognitive deficits. Patient was ~90% intelligible at the phrase and sentence level with Min A verbal cues. Patient left upright in wheelchair with family present. Continue with current plan of care.      Function:  Eating Eating    Modified Consistency Diet: Yes Eating Assist Level: More than reasonable amount of time;Set up assist for;Supervision or verbal cues;Helper checks for pocketed food   Eating Set Up Assist For: Opening containers       Cognition Comprehension Comprehension assist level: Understands complex 90% of the time/cues 10% of the time  Expression   Expression assist level: Expresses basic needs/ideas: With extra time/assistive device  Social Interaction Social Interaction assist level: Interacts appropriately 75 - 89% of the time - Needs redirection for appropriate language or to initiate interaction.  Problem Solving Problem solving assist level: Solves basic 50 - 74% of the time/requires cueing 25 - 49% of the time  Memory Memory assist level: Recognizes or recalls 50 - 74% of the time/requires cueing 25 - 49% of the time    Pain No/Denies Pain   Therapy/Group: Individual Therapy  Arasely Akkerman 03/17/2016, 3:41 PM

## 2016-03-17 NOTE — Plan of Care (Signed)
Problem: RH BOWEL ELIMINATION Goal: RH STG MANAGE BOWEL WITH ASSISTANCE STG Manage Bowel with Min  Assistance.  Outcome: Progressing Continent x 1 with staff today

## 2016-03-18 ENCOUNTER — Encounter (HOSPITAL_COMMUNITY): Payer: Self-pay | Admitting: Psychology

## 2016-03-18 ENCOUNTER — Ambulatory Visit (HOSPITAL_COMMUNITY): Payer: Self-pay | Admitting: Speech Pathology

## 2016-03-18 ENCOUNTER — Inpatient Hospital Stay (HOSPITAL_COMMUNITY): Payer: Self-pay | Admitting: Physical Therapy

## 2016-03-18 ENCOUNTER — Inpatient Hospital Stay (HOSPITAL_COMMUNITY): Payer: Self-pay | Admitting: Speech Pathology

## 2016-03-18 ENCOUNTER — Inpatient Hospital Stay (HOSPITAL_COMMUNITY): Payer: Self-pay | Admitting: Occupational Therapy

## 2016-03-18 DIAGNOSIS — R414 Neurologic neglect syndrome: Secondary | ICD-10-CM | POA: Diagnosis present

## 2016-03-18 DIAGNOSIS — G8114 Spastic hemiplegia affecting left nondominant side: Secondary | ICD-10-CM | POA: Diagnosis present

## 2016-03-18 MED ORDER — TRAZODONE HCL 50 MG PO TABS
25.0000 mg | ORAL_TABLET | Freq: Every evening | ORAL | Status: DC | PRN
  Administered 2016-03-19: 25 mg via ORAL
  Filled 2016-03-18: qty 1

## 2016-03-18 MED ORDER — NICOTINE 21 MG/24HR TD PT24
21.0000 mg | MEDICATED_PATCH | Freq: Every day | TRANSDERMAL | Status: DC
  Administered 2016-03-18 – 2016-04-02 (×16): 21 mg via TRANSDERMAL
  Filled 2016-03-18 (×16): qty 1

## 2016-03-18 MED ORDER — NICOTINE 7 MG/24HR TD PT24
7.0000 mg | MEDICATED_PATCH | Freq: Every day | TRANSDERMAL | Status: DC
  Filled 2016-03-18: qty 1

## 2016-03-18 NOTE — Consult Note (Signed)
Neuropsychological Consultation  Patient:   Grant Garcia   DOB:   1965/07/05  MR Number:  [id number]  Location:  [facility] [id number] [place] [address] Dept: [phone] Loc: [phone]           Date of Service:   03/18/2016   Start Time:   2:30 End Time:   3:30  Provider/Observer:          Billing Code/Service: 96150  Chief Complaint:      Reason for Service:  The patient was referred for psychological/neuropsychological consultation.  The patient is a 51 yo right handed male.  He speaks [place] and very little [place].  The patient had a Right Hemisphere stroke with residual left-sided weakness.  His expressive and reception language appear intact.  The patient and his wife report a prior history of anxiety.  He reports he would worry about "everything" but especially about his wife and her diabietes.  She confirms this level of worry and anxiety but he never had any formal treatment for this.  He reports that this worry and anxiety intrude on his immediate thoughts and sometimes make it hard for him to focus on the task at hand.  Current Status:  The patient is status post right CVA.  He has left side weakness with worsening of his pre-existing anxiety.  Reliability of Information: If formation is from the Patient, His Wife, conversations from Staff and review of medical records.  Behavioral Observation: Braxtyn Bojarski  presents as a 51 y.o.-year-old Right Hispanic Male who appeared his stated age. his dress was Appropriate and he was Well Groomed and his manners were Appropriate to the situation.  his participation was indicative of Appropriate behaviors.  There were physical disabilities noted.  He had little to no movement in his left arm and hand.  He was not able to independently adjust himself in his wheel chair.   he displayed an appropriate level of cooperation and  motivation.  Due to language barriers an interpreter was present during the clinical interview.   Interactions:    Active Appropriate  Attention:   within normal limits and attention span and concentration were age appropriate  Memory:   within normal limits; recent and remote memory intact  Visuo-spatial:  within normal limits  Speech (Volume):  low  Speech:   normal; normal  Thought Process:  Coherent  Though Content:  WNL and Rumination; not suicidal  Orientation:   person, place, time/date and situation  Judgment:   Fair  Planning:   Fair  Affect:    Anxious  Mood:    Anxious  Insight:   Present  Intelligence:   normal  Substance Use:  There is a documented history of tobacco abuse confirmed by the patient and family members.    Medical History:   Past Medical History:  Diagnosis Date  . Medical history non-contributory        @ENCMED @        Sexual History:   History  Sexual Activity  . Sexual activity: Not on file    Abuse/Trauma History:   Psychiatric History:  The patient denies prior formal treatment for anxiety, however, the information provided by patient and his wife suggest a generalized anxiety disorder and exacerbation of anxiety with current medical situation.  Family Med/Psych History: History reviewed. No pertinent family history.  Risk of Suicide/Violence: virtually non-existent Patient denies SI or HI.  Impression/DX:  The patient has had a signficant multifocal right MCA territory infarct.  He has lost most function of left arm and left leg.  He had a pre-existing anxiety disorder likely, but this has exacerbated his anxiety.    Disposition/Plan:  We worked on Producer, television/film/videobuilding coping skills around his anxiety in an effort to reduced it's negative impact on his rehabilitation efforts.           Electronically Signed   _______________________ Arley PhenixJohn Rodenbough, Psy.D.

## 2016-03-18 NOTE — Progress Notes (Signed)
Speech Language Pathology Daily Session Note  Patient Details  Name: Grant Garcia MRN: [id number] Date of Birth: 05/15/1965  Today's Date: 03/18/2016 SLP Individual Time: 0830-0930 SLP Individual Time Calculation (min): 60 min  Short Term Goals: Week 1: SLP Short Term Goal 1 (Week 1): Patient will demonstrate use of lingual sweep and oral clearance of Dys.1 textures and thin liquids with Supervision level verbal cues. SLP Short Term Goal 2 (Week 1): Patient will demonstrate effective masticaiton and oral clearance of Dys.2 textures with Min verbal cues over two sessions prior to upgrade.  SLP Short Term Goal 3 (Week 1): Patient will attend to left upper extremity and to left of midline with Mod assist multimodal cues during functional tasks. SLP Short Term Goal 4 (Week 1): Patient will solve problem realted to self-care tasks with Mod assist multimodal cues for self-monitoring and correcting. SLP Short Term Goal 5 (Week 1): Patient will utilize over articulation at the phrase-sentence level of verbal expression with Min assist verbal and non-verbal cues.   Skilled Therapeutic Interventions: Skilled treatment session focused on dysphagia and cognitive goals. SLP facilitated session by providing skilled observation with breakfast meal of Dys. 2 textures with thin liquids without overt s/s of aspiration and Min A verbal cues to self-monitor and correct left anterior spillage and pocketing. Recommend patient upgrade to Dys. 2 textures. Patient required Max A verbal cues for safety awareness and with repositioning due to safety. Patient also required Max verbal cues to locate items on tray in left field of environment. Patient left upright in wheelchair with family present and quick release belt in place. Continue with current plan of care.      Function:  Eating Eating   Modified Consistency Diet: Yes Eating Assist Level: Assistive Device;Set up assist for;Supervision or verbal cues;Helper  checks for pocketed food   Eating Set Up Assist For: Opening containers;Cutting food       Cognition Comprehension Comprehension assist level: Understands complex 90% of the time/cues 10% of the time  Expression   Expression assist level: Expresses basic needs/ideas: With extra time/assistive device  Social Interaction Social Interaction assist level: Interacts appropriately 75 - 89% of the time - Needs redirection for appropriate language or to initiate interaction.  Problem Solving Problem solving assist level: Solves basic 50 - 74% of the time/requires cueing 25 - 49% of the time  Memory Memory assist level: Recognizes or recalls 50 - 74% of the time/requires cueing 25 - 49% of the time    Pain No/Denies Pain   Therapy/Group: Individual Therapy  [name] 03/18/2016, 11:44 AM

## 2016-03-18 NOTE — Progress Notes (Signed)
Occupational Therapy Session Note  Patient Details  Name: Grant Garcia MRN: [id number] Date of Birth: May 01, 1965  Today's Date: 03/18/2016 OT Individual Time: 1000-1056 OT Individual Time Calculation (min): 56 min    Short Term Goals: Week 1:  OT Short Term Goal 1 (Week 1): Pt will complete upper body dressing with min assist OT Short Term Goal 2 (Week 1): Pt will complete lower body dressing, sitting and standing with mod assist OT Short Term Goal 3 (Week 1): Pt will manage LUE during performance of BADL with min vc OT Short Term Goal 4 (Week 1): Pt will complete transfer to toilet with steadying assist  Skilled Therapeutic Interventions/Progress Updates:    Treatment session with focus on safety awareness, Lt attention, and LUE NMR.  Pt received in w/c upon arrival.  Engaged in discussion regarding fall over night with explanation of fall risk and recommendation to utilize call bell to notify nursing staff of any need.  Completed LB dressing with max cues for sequencing and problem solving and to slow down due to impulsivity.  Pt with decreased awareness of errors with continuing to attempt despite difficulty.  Therapist issued elastic shoelaces with pt able to don Rt shoe with setup, still requiring assist to don Lt shoe.  Pt propelled w/c with hemi technique with use of RLE and RUE and min cues for Lt attention and awareness.  Completed squat pivot transfer to Lt with min assist, min cues for sequencing and cues due to impulsivity.  Engaged in LUE NMR in supine with focus on PROM to Lt shoulder and elbow in all ranges.  Pt able to elicit trace internal rotation with gravity eliminated.  Returned to room and transferred back to bed as pt falling asleep during therapy session.  Left semi-reclined in bed with interpreter and pt's boss present.  Therapy Documentation Precautions:  Precautions Precautions: Fall Precaution Comments: L side weakness Restrictions Weight Bearing Restrictions:  No Pain:  Pt with no c/o pain  See Function Navigator for Current Functional Status.   Therapy/Group: Individual Therapy  [name], Keamber Macfadden 03/18/2016, 11:34 AM

## 2016-03-18 NOTE — Progress Notes (Signed)
[facility] PHYSICAL MEDICINE & REHABILITATION     PROGRESS NOTE    Subjective/Complaints: Patient remains impulsive. Fell yesterday, but did not injure himself, wife left the room briefly bed alarm was not activated Interpreter at bedside today Basic ROS denies chest pain, shortness of breath, constipation, diarrhea, abdominal pain. Joint pain.  Objective: Vital Signs: Blood pressure 122/62, pulse (!) 58, temperature 98.9 F (37.2 C), temperature source Oral, resp. rate 16, height 5' 6"  (1.676 m), weight 74.6 kg (164 lb 6.4 oz), SpO2 98 %. No results found.  Recent Labs  03/16/16 0701  WBC 8.5  HGB 14.0  HCT 41.5  PLT 162    Recent Labs  03/16/16 0701  NA 136  K 3.9  CL 103  GLUCOSE 114*  BUN 19  CREATININE 0.73  CALCIUM 9.3   CBG (last 3)  No results for input(s): GLUCAP in the last 72 hours.  Wt Readings from Last 3 Encounters:  03/14/16 74.6 kg (164 lb 6.4 oz)  03/13/16 73.6 kg (162 lb 4.1 oz)    Physical Exam:  Constitutional: He appears well-developed. No distress.  HENT:  Head: Normocephalicand atraumatic.  Eyes: EOMare normal. Left eye exhibits no discharge.  Neck: Normal range of motion. Neck supple. No JVDpresent. No thyromegalypresent.  Cardiovascular: RRR No murmurheard. Respiratory:CTA without wheezes GI: Soft. Bowel sounds are normal. He exhibits no distension. There is no tenderness. There is no rebound.  Musculoskeletal: Normal range of motion.No pain with shoulder range of motion  Skin: Skin is warmand dry. He is not diaphoretic.  Skin. Warm and dry Neurological:   Speech sl dysarthric.. RUE and RLE motor 5/5. LUE remain0/5 prox to distal with mildflexor tone pattern LUE.LLE 2+ hip /knee extension  Left inattention.Decreased sensation to LT/withdrawl to pain in LLE and withdraws to pinch. Left central 7. Fair insight and awareness.   Assessment/Plan: 1. Functional and mobility deficits secondary to right MCA infarct which  require 3+ hours per day of interdisciplinary therapy in a comprehensive inpatient rehab setting. Physiatrist is providing close team supervision and 24 hour management of active medical problems listed below. Physiatrist and rehab team continue to assess barriers to discharge/monitor patient progress toward functional and medical goals.  Function:  Bathing Bathing position   Position: Shower  Bathing parts Body parts bathed by patient: Right arm, Chest, Abdomen, Front perineal area, Right upper leg, Left upper leg, Right lower leg Body parts bathed by helper: Left arm, Buttocks, Left lower leg, Back  Bathing assist Assist Level:  (Mod assist)      Upper Body Dressing/Undressing Upper body dressing   What is the patient wearing?: Pull over shirt/dress     Pull over shirt/dress - Perfomed by patient: Put head through opening, Pull shirt over trunk Pull over shirt/dress - Perfomed by helper: Thread/unthread right sleeve, Thread/unthread left sleeve        Upper body assist Assist Level:  (Mod assist)      Lower Body Dressing/Undressing Lower body dressing   What is the patient wearing?: Pants, Shoes Underwear - Performed by patient: Thread/unthread right underwear leg, Pull underwear up/down Underwear - Performed by helper: Thread/unthread left underwear leg Pants- Performed by patient: Thread/unthread right pants leg, Pull pants up/down Pants- Performed by helper: Thread/unthread left pants leg   Non-skid slipper socks- Performed by helper: Don/doff right sock, Don/doff left sock   Socks - Performed by helper: Don/doff right sock, Don/doff left sock Shoes - Performed by patient: Don/doff left shoe Shoes - Performed by  helper: Don/doff right shoe, Don/doff left shoe       TED Hose - Performed by helper: Don/doff right TED hose, Don/doff left TED hose  Lower body assist Assist for lower body dressing:  (Max assist)      Toileting Toileting Toileting activity did not  occur: No continent bowel/bladder event        Toileting assist     Transfers Chair/bed transfer   Chair/bed transfer method: Squat pivot Chair/bed transfer assist level: Touching or steadying assistance (Pt > 75%) Chair/bed transfer assistive device: Armrests     Locomotion Ambulation     Max distance: 20 ft Assist level:  (max assist + w/c follow for safety)   Wheelchair   Type: Manual Max wheelchair distance: 100 ft Assist Level: Moderate assistance (Pt 50 - 74%)  Cognition Comprehension Comprehension assist level: Understands complex 90% of the time/cues 10% of the time  Expression Expression assist level: Expresses basic needs/ideas: With extra time/assistive device  Social Interaction Social Interaction assist level: Interacts appropriately 75 - 89% of the time - Needs redirection for appropriate language or to initiate interaction.  Problem Solving Problem solving assist level: Solves basic 50 - 74% of the time/requires cueing 25 - 49% of the time  Memory Memory assist level: Recognizes or recalls 50 - 74% of the time/requires cueing 25 - 49% of the time   Medical Problem List and Plan: 1. 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 Formulized individual treatment plan based on medical history, underlying problem and comorbidities. 2. DVT Prophylaxis/Anticoagulation: Subcutaneous Lovenox. Monitor platelet counts and any signs of bleeding, PLT 162K on 2/19 3. Pain Management: Tylenol as needed 4. Mood: Provide emotional support 5. Neuropsych: This patient iscapable of making decisions on hisown behalf. 6. Skin/Wound Care: Routine skin checks 7. Fluids/Electrolytes/Nutrition: Routine I&O with follow-up  chemistries -encourage PO- intake improved now that diet upgraded to D 2 8.Dysphagia. Dysphagia #1 thin liquid diet. Monitor for any aspiration. Advance per speech therapy, still coughing and having problem with saliva 9.Hyperlipidemia. Lipitor 10.Tobacco abuse. Counseling, start NicoDerm patch 11. Left neglect may do well with VR  LOS (Days) 4 A FACE TO FACE EVALUATION WAS PERFORMED  Charlett Blake, MD 03/18/2016 12:04 PM

## 2016-03-18 NOTE — Progress Notes (Signed)
Wife at bedside with daughter on the phone informs nurse of concerns as patient being anxious and unable to rest. Nurse inquires on tobacco history and about possibly needing nicotine patch. Daughter in agreement and states patient smokes at least 1.5-2 ppd for the past decade and strongly encourages the use of a nicotine patch. Nurse inquired with on call Md, Jacalyn Lefevreunice Thomas, in regards to acquiring a nicotine patch and something to help patient rest. MD stated to inquire about nicotine patch with morning MD as Riley Lamunice is uncertain of how to correctly nicotine patch not personally knowing patient. MD also stated of not being comfortable as to ordering something to help patient rest or sleep as patient fell earlier.

## 2016-03-18 NOTE — Progress Notes (Signed)
Physical Therapy Session Note  Patient Details  Name: Grant Garcia MRN: [id number] Date of Birth: Oct 09, 1965  Today's Date: 03/18/2016 PT Individual Time: 1300-1415 PT Individual Time Calculation (min): 75 min   Short Term Goals: Week 1:  PT Short Term Goal 1 (Week 1): Pt will complete bed mobility with min assist. PT Short Term Goal 2 (Week 1): Pt will complete basic functional transfers with min assist. PT Short Term Goal 3 (Week 1): Pt will ambulate 50 ft with LRAD & min assist.  Skilled Therapeutic Interventions/Progress Updates:    no c/o pain.  Session focus on NMR via functional mobility, strengthening, and activity tolerance.  Pt demos some improvement in impulsivity today with familiar tasks, however continues to require increased cues for pacing with new tasks.   Pt transitions supine>sit with mod multimodal cues for sequencing and use of bed rails, ultimately min assist to elevate trunk.  Pt noted to be incontinent of urine upon sitting with both shirt and pants soaked.  Dynamic sitting balance EOB for changing clothes and brief with min assist for sitting balance, LB dressing with sit<>stand from EOB with min assist for balance.  Pt donned shoes EOB with increased time and assist for L shoe.  Stand/pivot to L throughout session with steady assist and mod cues for set up, sequencing, and pacing.  W/C propulsion to and from therapy gym with supervision and pt avoiding obstacles in L visual field 80% of the time.  Pt engaged in therex for strengthening/NMR on therapy mat x10 reps L clam shells, isometric hip adduction in R side lying (target L adductors), BLE support bridges, and reciprocal scooting.  Gait training with hemiwalker x23' +50' with seated rest break in between, max assist for upright posture, weight shift, and LLE advancement/placement.  Pt returned to room at end of session and positioned upright in w/c with QRB and half lap tray in place, call bell in reach and needs met.    Therapy Documentation Precautions:  Precautions Precautions: Fall Precaution Comments: L side weakness Restrictions Weight Bearing Restrictions: No    See Function Navigator for Current Functional Status.   Therapy/Group: Individual Therapy  Earnest Conroy Penven-Crew 03/18/2016, 3:28 PM

## 2016-03-18 NOTE — Progress Notes (Signed)
Wife in family break room talking to daughter on the phone. Nurse informed wife and daughter via cell phone of patient's fall and condition. Wife and daughter concerned but thankful of no harm. Wife went in to check on patient.

## 2016-03-19 ENCOUNTER — Inpatient Hospital Stay (HOSPITAL_COMMUNITY): Payer: Self-pay | Admitting: Physical Therapy

## 2016-03-19 ENCOUNTER — Inpatient Hospital Stay (HOSPITAL_COMMUNITY): Payer: Self-pay | Admitting: Speech Pathology

## 2016-03-19 ENCOUNTER — Inpatient Hospital Stay (HOSPITAL_COMMUNITY): Payer: Self-pay | Admitting: Occupational Therapy

## 2016-03-19 MED ORDER — TRAZODONE HCL 50 MG PO TABS
50.0000 mg | ORAL_TABLET | Freq: Every evening | ORAL | Status: DC | PRN
  Administered 2016-03-19 – 2016-04-03 (×8): 50 mg via ORAL
  Filled 2016-03-19 (×9): qty 1

## 2016-03-19 NOTE — Progress Notes (Signed)
Speech Language Pathology Daily Session Note  Patient Details  Name: Grant Garcia MRN: [id number] Date of Birth: 1965/11/26  Today's Date: 03/19/2016 SLP Individual Time: 1000-1100 SLP Individual Time Calculation (min): 60 min  Short Term Goals: Week 1: SLP Short Term Goal 1 (Week 1): Patient will demonstrate use of lingual sweep and oral clearance of Dys.1 textures and thin liquids with Supervision level verbal cues. SLP Short Term Goal 2 (Week 1): Patient will demonstrate effective masticaiton and oral clearance of Dys.2 textures with Min verbal cues over two sessions prior to upgrade.  SLP Short Term Goal 3 (Week 1): Patient will attend to left upper extremity and to left of midline with Mod assist multimodal cues during functional tasks. SLP Short Term Goal 4 (Week 1): Patient will solve problem realted to self-care tasks with Mod assist multimodal cues for self-monitoring and correcting. SLP Short Term Goal 5 (Week 1): Patient will utilize over articulation at the phrase-sentence level of verbal expression with Min assist verbal and non-verbal cues.   Skilled Therapeutic Interventions: Skilled treatment session focused on cognitive goals. Patient propelled his wheelchair to and from the dayroom with extra time and supervision verbal cues to attend to left field of environment. Patient participated in a new learning task with Min A verbal cues for recall of procedures to task, attention to left field of enviornment and for problem solving. Patient also required Mod A verbal cues for problem solving during a 4 step picture sequencing task and Min A verbal cues for use of an increased vocal intensity to improve overall speech intelligibility to 90% at the sentence level. Overall, patient appeared brighter with increased social interactions this session. Patient left supine in bed with all needs within reach. Continue with current plan of care.      Function:   Cognition Comprehension  Comprehension assist level: Understands complex 90% of the time/cues 10% of the time  Expression   Expression assist level: Expresses basic 75 - 89% of the time/requires cueing 10 - 24% of the time. Needs helper to occlude trach/needs to repeat words.  Social Interaction Social Interaction assist level: Interacts appropriately 75 - 89% of the time - Needs redirection for appropriate language or to initiate interaction.  Problem Solving Problem solving assist level: Solves basic 50 - 74% of the time/requires cueing 25 - 49% of the time  Memory Memory assist level: Recognizes or recalls 50 - 74% of the time/requires cueing 25 - 49% of the time    Pain No/Denies Pain   Therapy/Group: Individual Therapy  Timberly Yott 03/19/2016, 3:32 PM

## 2016-03-19 NOTE — Progress Notes (Signed)
[facility] PHYSICAL MEDICINE & REHABILITATION     PROGRESS NOTE    Subjective/Complaints: Slept poorly but no pain , bowel or bladder issues reported by RN from report ROS- no available , lnaguage  Objective: Vital Signs: Blood pressure 127/65, pulse 60, temperature 98.3 F (36.8 C), temperature source Oral, resp. rate 18, height 5\' 6"  (1.676 m), weight 74.6 kg (164 lb 6.4 oz), SpO2 99 %. No results found. No results for input(s): WBC, HGB, HCT, PLT in the last 72 hours. No results for input(s): NA, K, CL, GLUCOSE, BUN, CREATININE, CALCIUM in the last 72 hours.  Invalid input(s): CO CBG (last 3)  No results for input(s): GLUCAP in the last 72 hours.  Wt Readings from Last 3 Encounters:  03/14/16 74.6 kg (164 lb 6.4 oz)  03/13/16 73.6 kg (162 lb 4.1 oz)    Physical Exam:  Constitutional: He appears well-developed. No distress.  HENT:  Head: Normocephalicand atraumatic.  Eyes: EOMare normal. Left eye exhibits no discharge.  Neck: Normal range of motion. Neck supple. No JVDpresent. No thyromegalypresent.  Cardiovascular: RRR No murmurheard. Respiratory:CTA without wheezes GI: Soft. Bowel sounds are normal. He exhibits no distension. There is no tenderness. There is no rebound.  Musculoskeletal: Normal range of motion.No pain with shoulder range of motion  Skin: Skin is warmand dry. He is not diaphoretic.  Skin. Warm and dry Neurological:   Speech sl dysarthric.. RUE and RLE motor 5/5. LUE remain0/5 prox to distal with mildflexor tone pattern LUE.LLE 2+ hip /knee extension  Left inattention.Decreased sensation to LT/withdrawl to pain in LLE and withdraws to pinch. Left central 7. Fair insight and awareness.   Assessment/Plan: 1. Functional and mobility deficits secondary to right MCA infarct which require 3+ hours per day of interdisciplinary therapy in a comprehensive inpatient rehab setting. Physiatrist is providing close team supervision and 24 hour management  of active medical problems listed below. Physiatrist and rehab team continue to assess barriers to discharge/monitor patient progress toward functional and medical goals.  Function:  Bathing Bathing position   Position: Shower  Bathing parts Body parts bathed by patient: Right arm, Chest, Abdomen, Front perineal area, Right upper leg, Left upper leg, Right lower leg Body parts bathed by helper: Left arm, Buttocks, Left lower leg, Back  Bathing assist Assist Level:  (Mod assist)      Upper Body Dressing/Undressing Upper body dressing   What is the patient wearing?: Pull over shirt/dress     Pull over shirt/dress - Perfomed by patient: Put head through opening, Pull shirt over trunk Pull over shirt/dress - Perfomed by helper: Thread/unthread right sleeve, Thread/unthread left sleeve        Upper body assist Assist Level:  (Mod assist)      Lower Body Dressing/Undressing Lower body dressing   What is the patient wearing?: Pants, Shoes Underwear - Performed by patient: Thread/unthread right underwear leg, Pull underwear up/down Underwear - Performed by helper: Thread/unthread left underwear leg Pants- Performed by patient: Thread/unthread right pants leg, Pull pants up/down Pants- Performed by helper: Thread/unthread left pants leg   Non-skid slipper socks- Performed by helper: Don/doff right sock, Don/doff left sock   Socks - Performed by helper: Don/doff right sock, Don/doff left sock Shoes - Performed by patient: Don/doff left shoe Shoes - Performed by helper: Don/doff right shoe, Don/doff left shoe       TED Hose - Performed by helper: Don/doff right TED hose, Don/doff left TED hose  Lower body assist Assist for lower body  dressing:  (Max assist)      Financial traderToileting Toileting Toileting activity did not occur: No continent bowel/bladder event   Toileting steps completed by helper: Adjust clothing prior to toileting, Performs perineal hygiene, Adjust clothing after  toileting    Toileting assist Assist level: Two helpers   Transfers Chair/bed transfer   Chair/bed transfer method: Squat pivot Chair/bed transfer assist level: Touching or steadying assistance (Pt > 75%) Chair/bed transfer assistive device: Armrests     Locomotion Ambulation     Max distance: 20 ft Assist level:  (max assist + w/c follow for safety)   Wheelchair   Type: Manual Max wheelchair distance: 100 ft Assist Level: Moderate assistance (Pt 50 - 74%)  Cognition Comprehension Comprehension assist level: Understands complex 90% of the time/cues 10% of the time  Expression Expression assist level: Expresses basic needs/ideas: With extra time/assistive device  Social Interaction Social Interaction assist level: Interacts appropriately 75 - 89% of the time - Needs redirection for appropriate language or to initiate interaction.  Problem Solving Problem solving assist level: Solves basic 50 - 74% of the time/requires cueing 25 - 49% of the time  Memory Memory assist level: Recognizes or recalls 50 - 74% of the time/requires cueing 25 - 49% of the time   Medical Problem List and Plan: 1. 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 DVT Prophylaxis/Anticoagulation: Subcutaneous Lovenox. Monitor platelet counts and any signs of bleeding, PLT 162K on 2/19 3. Pain Management: Tylenol as needed 4. Mood: Provide emotional support 5. Neuropsych: This patient iscapable of making decisions on hisown behalf. 6. Skin/Wound Care: Routine skin checks 7. Fluids/Electrolytes/Nutrition: Routine I&O with follow-up chemistries -encourage PO- intake improved now that diet upgraded to D 2 8.Dysphagia. Dysphagia #1 thin liquid diet. Monitor for any aspiration. Advance per speech therapy, still coughing and having problem with saliva 9.Hyperlipidemia.  Lipitor 10.Tobacco abuse. Counseling,  NicoDerm patch 11. 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  Insomnia will increase trazodone from 25 to 50mg  LOS (Days) 5 A FACE TO FACE EVALUATION WAS PERFORMED  Erick ColaceKIRSTEINS,ANDREW E, MD 03/19/2016 8:23 AM

## 2016-03-19 NOTE — Progress Notes (Signed)
Physical Therapy Session Note  Patient Details  Name: Grant Garcia MRN: [id number] Date of Birth: Dec 06, 1965  Today's Date: 03/19/2016 PT Individual Time: 9381-8299 PT Individual Time Calculation (min): 71 min   Short Term Goals: Week 1:  PT Short Term Goal 1 (Week 1): Pt will complete bed mobility with min assist. PT Short Term Goal 2 (Week 1): Pt will complete basic functional transfers with min assist. PT Short Term Goal 3 (Week 1): Pt will ambulate 50 ft with LRAD & min assist. Week 2:     Skilled Therapeutic Interventions/Progress Updates:   Pt received supine in bed and agreeable to PT. Supine>sit transfer with mod assist and mod cues for use of R UE to push from bed to come to sitting surface.   Donned shoes EOB with mod assist to maintain balance and due to impulsivity of movement.   Squat pivot transfers completed with min assist to L and R with mod cues from PT for set up and improved use of LLE.   WC mobility through hall and over carpeted surface with supervision assist and min assist from PT to attend to obstacles on the L side. 2 bouts of WC training x 264f and 1741f Kinetron reciprocal movement training. 3 bouts x 2 minutes each at 50cm/sec. Min assist from PT to improve activation of the LLE as well as improve posture and prevent posterior tipping of WC.    Mini squat from Sitting to reach forward and to the L to improve LLE with sit<>stand transfer. Reach completed x 8 with mod assist to improved LLE activation and improve LE placement. Sit<>stand x 4 with min-mod assist and HW; lateral weight shift in standing with mod assist from PT to stabilize the LLE and allow increased acceptance of WB through LLE and encourage use of LLE to initiate lean to R>   PT instructed patient in Gait at rail in hall x 35 with with mod-max assist from PT to advance the LLE, stabilize knee to prevent buckling and maintain upright posture.   Pt returned to room and performed squat  pivot transfer to bed with min assist. Sit>supine completed with min assist to control the LLE and left supine in bed with call bell in reach and all needs met.         Therapy Documentation Precautions:  Precautions Precautions: Fall Precaution Comments: L side weakness Restrictions Weight Bearing Restrictions: No Pain: 0/10   See Function Navigator for Current Functional Status.   Therapy/Group: Individual Therapy  AuLorie Phenix/22/2018, 2:15 PM

## 2016-03-19 NOTE — Progress Notes (Signed)
Occupational Therapy Session Note  Patient Details  Name: Grant Garcia MRN: [id number] Date of Birth: 09-08-1965  Today's Date: 03/19/2016 OT Individual Time: 8295-62130830-0930 OT Individual Time Calculation (min): 60 min    Short Term Goals: Week 1:  OT Short Term Goal 1 (Week 1): Pt will complete upper body dressing with min assist OT Short Term Goal 2 (Week 1): Pt will complete lower body dressing, sitting and standing with mod assist OT Short Term Goal 3 (Week 1): Pt will manage LUE during performance of BADL with min vc OT Short Term Goal 4 (Week 1): Pt will complete transfer to toilet with steadying assist  Skilled Therapeutic Interventions/Progress Updates:    Treatment session with focus on ADL retraining with transfers, sit > stand, and hemi-technique with bathing and dressing.  Pt received in bed finishing breakfast.  Required increased time to complete self-feeding without cues to follow safe swallow strategies.  Completed squat pivot transfer bed > w/c with min assist with pt demonstrating increased sequencing and controlled movement with transfer.  Mod assist squat pivot transfer into room shower with increased impulsivity, requiring increased cues and assist.  Bathing completed with constant steady assist for sitting balance due to posterior tilt and lean to Lt in unsupported sitting.  Hand over hand to utilize LUE with wash mitt when washing of Rt arm and Rt foot.  Min cues for sequencing and thoroughness of bathing.  Dressing completed seated at sink with pt attempting to pull shirt over head first, requiring cues to thread Lt arm first to increase attention to Lt side and assist with sequencing of dressing.  Min assist sit > stand for LB dressing with cues to attempt to reach behind back to pull pants over Lt hip as well when pulling up pants.  Re-educated on use of elastic laces as wife had untied them to assist with doffing.  Pt able to don Rt shoe, however required assist with Lt.   Left upright in w/c with quick release belt in place for improved sitting posture and safety.  Therapy Documentation Precautions:  Precautions Precautions: Fall Precaution Comments: L side weakness Restrictions Weight Bearing Restrictions: No Pain:  Pt with no c/o pain  See Function Navigator for Current Functional Status.   Therapy/Group: Individual Therapy  [name], Brianca Fortenberry 03/19/2016, 9:44 AM

## 2016-03-20 ENCOUNTER — Inpatient Hospital Stay (HOSPITAL_COMMUNITY): Payer: Self-pay | Admitting: Occupational Therapy

## 2016-03-20 ENCOUNTER — Inpatient Hospital Stay (HOSPITAL_COMMUNITY): Payer: Self-pay | Admitting: Speech Pathology

## 2016-03-20 ENCOUNTER — Inpatient Hospital Stay (HOSPITAL_COMMUNITY): Payer: Self-pay | Admitting: Physical Therapy

## 2016-03-20 NOTE — Progress Notes (Signed)
Social Work Assessment and Plan  Patient Details  Name: Grant Garcia MRN: [id number] Date of Birth: 08-Jan-1966  Today's Date: 03/18/2016  Problem List:  Patient Active Problem List   Diagnosis Date Noted  . Left hemiparesis ([place]) 03/18/2016  . Left-sided neglect 03/18/2016  . 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 Smoker   . Hyperlipidemia   . CVA (cerebral vascular accident) ([place]) 03/11/2016  . Respiratory failure [facility])    Past Medical History:  Past Medical History:  Diagnosis Date  . Medical history non-contributory    Past Surgical History:  Past Surgical History:  Procedure Laterality Date  . IR GENERIC HISTORICAL  03/11/2016   IR ANGIO VERTEBRAL SEL VERTEBRAL UNI R MOD SED 03/11/2016 [name], DO [facility]-INTER[facility]  . IR GENERIC HISTORICAL  03/11/2016   IR ANGIO INTRA EXTRACRAN SEL COM CAROTID INNOMINATE UNI L MOD SED 03/11/2016 [name], DO [facility]  . IR GENERIC HISTORICAL  03/11/2016   IR PERCUTANEOUS ART THROMBECTOMY/INFUSION INTRACRANIAL INC DIAG ANGIO 03/11/2016 [name], DO [facility]  . IR GENERIC HISTORICAL  03/11/2016   IR US GUIDE VASC ACCESS RIGHT 03/11/2016 [name], DO [facility]  . RADIOLOGY WITH ANESTHESIA N/A 03/11/2016   Procedure: RADIOLOGY WITH ANESTHESIA;  Surgeon: Medication Radiologist, MD;  Location: [facility];  Service: Radiology;  Laterality: N/A;   Social History:  reports that he has been smoking Cigarettes.  He has a 60.00 pack-year smoking history. He has never used smokeless tobacco. He reports that he does not drink alcohol or use drugs.  Family / Support Systems Marital Status: Married How Long?: 25 years Patient Roles: Spouse, Parent, Other (Comment) (employee) Spouse/Significant Other: Grant Garcia - wife - [phone] (h);  [phone] (m) Children: 66 y/o dtr; 12 y/o son - both are students Other Supports: extended family and friends Anticipated Caregiver: wife and  family Ability/Limitations of Caregiver: none Family Dynamics: close, supportive family  Social History Preferred language: English Religion:  Read: Yes Write: Yes Employment Status: Employed Name of Employer: [employer] Length of Employment: 14 Return to Work Plans: Pt would like to return to work when he is able.  Pt is still getting paid from his employer. Legal History/Current Legal Issues: none reported Guardian/Conservator: N/A - MD has determined that pt is capable of making his own decisions.   Abuse/Neglect Physical Abuse: Denies Verbal Abuse: Denies Sexual Abuse: Denies Exploitation of patient/patient's resources: Denies Self-Neglect: Denies  Emotional Status Pt's affect, behavior and adjustment status: Pt is emotional about his stroke and current condition.  He is motivated to get better, however, and is a Scientist, research (physical sciences). Recent Psychosocial Issues: none reported, besides wife reports pt works a lot of hours. Psychiatric History: none reported Substance Abuse History: Pt has stopped drinking alcohol years ago and plans to stop smoking.  Nicotine patch placed.  Patient / Family Perceptions, Expectations & Goals Pt/Family understanding of illness & functional limitations: Pt/wife report a good understanding of pt's condition and limitations, however pt's awareness is poor. Premorbid pt/family roles/activities: Pt works most days, but on his day off, he spends time with his wife - cleaning, running errands, etc. Anticipated changes in roles/activities/participation: Pt realizes it will be a while before he returns to work, but really wants to get back to is as soon as possible. Pt/family expectations/goals: Pt wants to get back to work.  Wants to be home.  Community Resources Express Scripts: None Premorbid Home Care/DME Agencies: None Transportation available at  discharge: family Resource referrals recommended: Neuropsychology, Support group  (specify)  Discharge Planning Living Arrangements: Spouse/significant other, Children Support Systems: Spouse/significant other, Children, Other relatives, Friends/neighbors Type of Residence: Private residence Insurance Resources: Teacher, adult education Resources: Employment Financial Screen Referred: Previously completed Money Management: Patient Does the patient have any problems obtaining your medications?: Yes (Describe) (pt without insurance coverage) Home Management: Pt's wife takes care of inside chores.  Pt cares for other bigger, outdoor projects. Patient/Family Preliminary Plans: Pt plans to return home with his wife and children at d/c. Barriers to Discharge: Self care Social Work Anticipated Follow Up Needs: HH/OP, Support Group Expected length of stay: 2 to 3 weeks  Clinical Impression CSW met with pt and pt's wife with assistance of medical Spanish Interpreter to introduce self and role of CSW, as well as to complete assessment.  Pt was open with CSW, but was also quite emotional and CSW explained this is normal for people who have had a stroke.  CSW referred pt to Neuropsychologist.  Pt to go home with his wife, dtr, and son.  Pt appreciative of being on CIR.  No current concerns/questions/needs at this tine.  CSW will continue to follow and assist as needed.  Zacariah Belue, Silvestre Mesi 03/18/2016, 12:46 PM

## 2016-03-20 NOTE — Progress Notes (Signed)
Social Work Patient ID: Grant Garcia, male   DOB: [birth year], 51 y.o.   MRN: [id number]   CSW met with pt and his wife with the assistance of medical Spanish interpreter to update them on team conference and expected d/c date of 04-04-16.  Pt and wife were in agreement with d/c date, as they know this is the most therapy pt will be able to have.  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  CSW explained that pt's d/c date would be reviewed each week and can be changed, as needed.

## 2016-03-20 NOTE — Progress Notes (Signed)
Occupational Therapy Session Note  Patient Details  Name: Grant Garcia MRN: [id number] Date of Birth: 01-20-1966  Today's Date: 03/20/2016 OT Individual Time: [id number] OT Individual Time Calculation (min): 56 min    Short Term Goals: Week 1:  OT Short Term Goal 1 (Week 1): Pt will complete upper body dressing with min assist OT Short Term Goal 2 (Week 1): Pt will complete lower body dressing, sitting and standing with mod assist OT Short Term Goal 3 (Week 1): Pt will manage LUE during performance of BADL with min vc OT Short Term Goal 4 (Week 1): Pt will complete transfer to toilet with steadying assist  Skilled Therapeutic Interventions/Progress Updates:    Treatment session with focus on bed mobility, sit > stand, and LUE NMR.  Pt received in bed requesting to work on LUE during session.  Mod assist and max cues for bed mobility this session with focus on proper body mechanics to increase safety and independence.  Completed perineal hygiene and donned pants at EOB with focus on sit > stand and sequencing with dressing tasks.  Pt continues to require cues for awareness and to slow down as pt very impulsive with movements.  Pt propelled w/c to therapy gym with hemi-technique and supervision.  Engaged in LUE NMR in supine and sidelying with focus on shoulder activation with and without tactile cues.  Utilized tactile cues with tapping to Lt bicep and tricep to facilitate increased activation with intermittent results.  Pt with trace bicep activation in sidelying with use of hand skate and tactile cues.  Returned to room as above and left upright in w/c with quick release belt donned.  Therapy Documentation Precautions:  Precautions Precautions: Fall Precaution Comments: L side weakness Restrictions Weight Bearing Restrictions: No Pain:  Pt with no c/o pain  See Function Navigator for Current Functional Status.   Therapy/Group: Individual Therapy  [name], Grant Garcia 03/20/2016, 3:21  PM

## 2016-03-20 NOTE — Patient Care Conference (Signed)
Inpatient RehabilitationTeam Conference and Plan of Care Update Date: 03/18/2016   Time: 11:25 AM    Patient Name: Grant Garcia      Medical Record Number: [id number]  Date of Birth: 08/06/1965 Sex: Male         Room/Bed: 4W22C/4W22C-01 Payor Info: Payor: MEDICAID POTENTIAL / Plan: MEDICAID POTENTIAL / Product Type: *No Product type* /    Admitting Diagnosis: cva  Admit Date/Time:  03/14/2016  4:18 PM Admission Comments: No comment available   Primary Diagnosis:  Right middle cerebral artery stroke [facility]) Principal Problem: Right middle cerebral artery stroke [facility])  Patient Active Problem List   Diagnosis Date Noted  . Left hemiparesis ([facility]) 03/18/2016  . Left-sided neglect 03/18/2016  . Right middle cerebral artery stroke ([facility]) 03/14/2016  . Smoker   . Hyperlipidemia   . CVA (cerebral vascular accident) ([facility]) 03/11/2016  . Respiratory failure [facility])     Expected Discharge Date: Expected Discharge Date: 04/04/16  Team Members Present: Physician leading conference: Dr. Claudette Garcia Social Worker Present: Grant Der, LCSW Nurse Present: Grant End, RN PT Present: Grant Garcia, PT OT Present: Grant Garcia, OT SLP Present: Grant Garcia, SLP Other (Discipline and Name): Grant Phenix, PsyD PPS Coordinator present : Grant Duck, RN, CRRN     Current Status/Progress Goal Weekly Team Focus  Medical   very impulsive, hx of tobacco abuse,   reduce fallrisk, improve cont of bowel and bladder  timed toileting , improve bowel continence   Bowel/Bladder   Continent of bowel. LBM 03/17/16. Incontinent of urne. Utilizes condom cath at night  Managed bowel and bladder.   Encourage time toileting q 2hrs   Swallow/Nutrition/ Hydration   Dys. 2 textures with thin liquids, Min A   Supervision   use of swallowing strategies, tolerance of current diet, possible trials of upgraded textures    ADL's   mod assist bathing and transfers, max assist LB dressing  Supervision  bathing, UB dressing, toilet transfers; Min assist LB dressing, standing and tub/shower transfers  ADL retraining, trunk control, transfers, sit > stand, hemi-technique with bathing and dressing, LUE NMR   Mobility   mod/max for functional mobility 2/2 global cognitive deficits  supervision ambulatory  attention, problem solving, safety awareness, balance, all functional mobility    Communication   Supervision  Mod I   use of speech intelligibility strategies    Safety/Cognition/ Behavioral Observations  Mod A  Supervision   problem solving, awareness, safety    Pain   No c/o pain  <3  Monitor for nonverbal cues of pain   Skin   Skin CDI  No additional skin breakdown  Assess q shift    Rehab Goals Patient on target to meet rehab goals: Yes Rehab Goals Revised: none - pt's first conference *See Care Plan and progress notes for long and short-term goals.  Barriers to Discharge: see above,    Possible Resolutions to Barriers:  timed toileting, neuropsych eval    Discharge Planning/Teaching Needs:  Pt to return to his home with his wife to provide supervision.  Pt's wife is present at pt's bedside regularly, but will have access to family education closer to d/c.   Team Discussion:  Pt with safety issues and will need supervision with some minimal assistance.  Pt with new nicotine patch.  Pt has severe weakness and left neglect.  Pt is incontinent of bowel and bladder.  Pt is D1 full supervision, but upgraded to D2 and thin liquids on day of conference.  Pt with poor awareness and fell as a result.  Pt is impulsive and throws himself with transfers.  Therapists are not ready to train wife yet.  Pt needs cues to slow down.  Revisions to Treatment Plan:  None - 1st conference   Continued Need for Acute Rehabilitation Level of Care: The patient requires daily medical management by a physician with specialized training in physical medicine and rehabilitation for the following  conditions: Daily direction of a multidisciplinary physical rehabilitation program to ensure safe treatment while eliciting the highest outcome that is of practical value to the patient.: Yes Daily medical management of patient stability for increased activity during participation in an intensive rehabilitation regime.: Yes Daily analysis of laboratory values and/or radiology reports with any subsequent need for medication adjustment of medical intervention for : Neurological problems;Other  Grant Garcia, Grant Garcia 03/20/2016, 12:12 AM

## 2016-03-20 NOTE — Progress Notes (Signed)
Physical Therapy Session Note  Patient Details  Name: Grant Garcia MRN: [id number] Date of Birth: 06/27/1965  Today's Date: 03/20/2016 PT Individual Time: 1335-1450 PT Individual Time Calculation (min): 75 min   Short Term Goals: Week 1:  PT Short Term Goal 1 (Week 1): Pt will complete bed mobility with min assist. PT Short Term Goal 2 (Week 1): Pt will complete basic functional transfers with min assist. PT Short Term Goal 3 (Week 1): Pt will ambulate 50 ft with LRAD & min assist.  Skilled Therapeutic Interventions/Progress Updates:    no c/o pain.  Session focus on w/c propulsion using hemi technique, standing balance, NMR, gait training with hemiwalker, and family education for transfers.   Pt propels w/c to and from therapy gym for mobility and endurance using R hemi technique with supervision.  Transfers to/from w/c with steady assist to R and L throughout session.   NMR as follows:  Pt engaged in static standing focus on equal weight bearing through BLEs, full knee extension on L, and upright posture with PT facilitating at L knee and providing tactile cues at hips/chest for posture and weight shifts.  Progress to dynamic standing shifting weight R and L with mod assist from PT for pt to maintain balance and maintain L knee extension.  Pt completed 2 trials of weight shifts to fatigue before requiring seated rest break.  Progress to dynamic standing focus on reaching with RUE posteriorly and across midline for clothes pin task with mod fade to min assist for balance and weight shifting.    Gait training x50' with hemi walker and mod assist overall with max multimodal cues for walker positioning, LLE swing through with heel strike, BOS, and upright posture.  Pt returned to room in w/c at end of session and PT educated wife and bed<>w/c transfers to R and L.  Wife, Grant Garcia, returned demonstration 2x to R and 2x to L satisfactorily and PT indicated on safety plan.  Discussed at length with  pt and wife continued use of QRB while pt in w/c for safety and that pt could only transfer with nursing staff, therapy staff, and Grant Garcia.  Both in agreement.     Therapy Documentation Precautions:  Precautions Precautions: Fall Precaution Comments: L side weakness Restrictions Weight Bearing Restrictions: No   See Function Navigator for Current Functional Status.   Therapy/Group: Individual Therapy  Ladora DanielCaitlin E Penven-Crew 03/20/2016, 5:04 PM

## 2016-03-20 NOTE — Progress Notes (Signed)
Speech Language Pathology Daily Session Note  Patient Details  Name: Grant Garcia MRN: [id number] Date of Birth: Oct 24, 1965  Today's Date: 03/20/2016 SLP Individual Time: 1005-1100 SLP Individual Time Calculation (min): 55 min  Short Term Goals: Week 1: SLP Short Term Goal 1 (Week 1): Patient will demonstrate use of lingual sweep and oral clearance of Dys.1 textures and thin liquids with Supervision level verbal cues. SLP Short Term Goal 2 (Week 1): Patient will demonstrate effective masticaiton and oral clearance of Dys.2 textures with Min verbal cues over two sessions prior to upgrade.  SLP Short Term Goal 3 (Week 1): Patient will attend to left upper extremity and to left of midline with Mod assist multimodal cues during functional tasks. SLP Short Term Goal 4 (Week 1): Patient will solve problem realted to self-care tasks with Mod assist multimodal cues for self-monitoring and correcting. SLP Short Term Goal 5 (Week 1): Patient will utilize over articulation at the phrase-sentence level of verbal expression with Min assist verbal and non-verbal cues.   Skilled Therapeutic Interventions: Skilled treatment session focused on cognitive and dysphagia goals. Patient independently recalled the name and procedures to a previously learned task in yesterday's session and performed task with supervision verbal cues for problem solving. Patient also required Min A verbal cues for problem solving during a 6 step picture sequencing task. Patient required supervision verbal cues for utilization of speech intelligibility strategies at the sentence level and was 100% intelligible while talking on phone. Patient also consumed trials of Dys. 3 textures and demonstrated efficient mastication with minimal oral residue in which he independently cleared with a liquid wash. Recommend trial tray prior to upgrade. Patient transferred back to bed at end of session and required Mod-Max A verbal cues for safety with  task. Patient left supine in bed with alarm on and family present. Continue with current plan of care.      Function:  Eating Eating   Modified Consistency Diet: Yes Eating Assist Level: Assistive Device;Set up assist for;Supervision or verbal cues   Eating Set Up Assist For: Opening containers       Cognition Comprehension Comprehension assist level: Understands complex 90% of the time/cues 10% of the time  Expression   Expression assist level: Expresses basic 75 - 89% of the time/requires cueing 10 - 24% of the time. Needs helper to occlude trach/needs to repeat words.  Social Interaction Social Interaction assist level: Interacts appropriately 75 - 89% of the time - Needs redirection for appropriate language or to initiate interaction.  Problem Solving Problem solving assist level: Solves basic 50 - 74% of the time/requires cueing 25 - 49% of the time  Memory Memory assist level: Recognizes or recalls 75 - 89% of the time/requires cueing 10 - 24% of the time    Pain No/Denies Pain   Therapy/Group: Individual Therapy  Evelyn Moch 03/20/2016, 3:46 PM

## 2016-03-20 NOTE — Progress Notes (Signed)
[facility] PHYSICAL MEDICINE & REHABILITATION     PROGRESS NOTE    Subjective/Complaints: Slept well, increased trazodone yesterday pm ROS- no available , lnaguage  Objective: Vital Signs: Blood pressure (!) 115/59, pulse (!) 50, temperature 97.6 F (36.4 C), temperature source Oral, resp. rate 18, height 5\' 6"  (1.676 m), weight 74.6 kg (164 lb 6.4 oz), SpO2 97 %. No results found. No results for input(s): WBC, HGB, HCT, PLT in the last 72 hours. No results for input(s): NA, K, CL, GLUCOSE, BUN, CREATININE, CALCIUM in the last 72 hours.  Invalid input(s): CO CBG (last 3)  No results for input(s): GLUCAP in the last 72 hours.  Wt Readings from Last 3 Encounters:  03/14/16 74.6 kg (164 lb 6.4 oz)  03/13/16 73.6 kg (162 lb 4.1 oz)    Physical Exam:  Constitutional: He appears well-developed. No distress.  HENT:  Head: Normocephalicand atraumatic.  Eyes: EOMare normal. Left eye exhibits no discharge.  Neck: Normal range of motion. Neck supple. No JVDpresent. No thyromegalypresent.  Cardiovascular: RRR No murmurheard. Respiratory:CTA without wheezes GI: Soft. Bowel sounds are normal. He exhibits no distension. There is no tenderness. There is no rebound.  Musculoskeletal: Normal range of motion.No pain with shoulder range of motion  Skin: Skin is warmand dry. He is not diaphoretic.  Skin. Warm and dry Neurological:   Speech sl dysarthric.. RUE and RLE motor 5/5. LUE remain0/5 prox to distal with mildflexor tone pattern LUE.LLE 2+ hip /knee extension  Left inattention.Decreased sensation to LT/withdrawl to pain in LLE and withdraws to pinch. Left central 7. Fair insight and awareness.   Assessment/Plan: 1. Functional and mobility deficits secondary to right MCA infarct which require 3+ hours per day of interdisciplinary therapy in a comprehensive inpatient rehab setting. Physiatrist is providing close team supervision and 24 hour management of active medical problems  listed below. Physiatrist and rehab team continue to assess barriers to discharge/monitor patient progress toward functional and medical goals.  Function:  Bathing Bathing position   Position: Shower  Bathing parts Body parts bathed by patient: Right arm, Chest, Abdomen, Front perineal area, Right upper leg, Left upper leg, Right lower leg Body parts bathed by helper: Left arm, Buttocks, Left lower leg, Back  Bathing assist Assist Level:  (Mod assist)      Upper Body Dressing/Undressing Upper body dressing   What is the patient wearing?: Pull over shirt/dress     Pull over shirt/dress - Perfomed by patient: Thread/unthread right sleeve, Put head through opening Pull over shirt/dress - Perfomed by helper: Thread/unthread left sleeve, Pull shirt over trunk        Upper body assist Assist Level:  (Mod assist)      Lower Body Dressing/Undressing Lower body dressing   What is the patient wearing?: Pants, Socks, [employer]ed Hose Underwear - Performed by patient: Thread/unthread right underwear leg, Pull underwear up/down Underwear - Performed by helper: Thread/unthread left underwear leg Pants- Performed by patient: Thread/unthread right pants leg Pants- Performed by helper: Thread/unthread left pants leg, Pull pants up/down   Non-skid slipper socks- Performed by helper: Don/doff right sock, Don/doff left sock   Socks - Performed by helper: Don/doff right sock, Don/doff left sock Shoes - Performed by patient: Don/doff right shoe Shoes - Performed by helper: Don/doff left shoe       TED Hose - Performed by helper: Don/doff right TED hose, Don/doff left TED hose  Lower body assist Assist for lower body dressing:  (Max assist)  Toileting Toileting Toileting activity did not occur: No continent bowel/bladder event Toileting steps completed by patient: Adjust clothing prior to toileting, Performs perineal hygiene, Adjust clothing after toileting Toileting steps completed by helper:  Adjust clothing prior to toileting, Performs perineal hygiene, Adjust clothing after toileting    Toileting assist Assist level: Two helpers   Transfers Chair/bed transfer   Chair/bed transfer method: Squat pivot Chair/bed transfer assist level: Touching or steadying assistance (Pt > 75%) Chair/bed transfer assistive device: Armrests     Locomotion Ambulation     Max distance: 20ft Assist level: Moderate assist (Pt 50 - 74%)   Wheelchair   Type: Manual Max wheelchair distance: 249ft  Assist Level: Supervision or verbal cues  Cognition Comprehension Comprehension assist level: Understands complex 90% of the time/cues 10% of the time  Expression Expression assist level: Expresses basic 75 - 89% of the time/requires cueing 10 - 24% of the time. Needs helper to occlude trach/needs to repeat words.  Social Interaction Social Interaction assist level: Interacts appropriately 75 - 89% of the time - Needs redirection for appropriate language or to initiate interaction.  Problem Solving Problem solving assist level: Solves basic 50 - 74% of the time/requires cueing 25 - 49% of the time  Memory Memory assist level: Recognizes or recalls 50 - 74% of the time/requires cueing 25 - 49% of the time   Medical Problem List and Plan: 1. 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 DVT Prophylaxis/Anticoagulation: Subcutaneous Lovenox. Monitor platelet counts and any signs of bleeding, PLT 162K on 2/19 3. Pain Management: Tylenol as needed 4. Mood: Provide emotional support 5. Neuropsych: This patient iscapable of making decisions on hisown behalf. 6. Skin/Wound Care: Routine skin checks 7. Fluids/Electrolytes/Nutrition: Routine I&O with follow-up chemistries -encourage PO-  8.Dysphagia. Dysphagia 2 thin liquid diet. Monitor for  any aspiration. Advance per speech therapy, still coughing and having problem with saliva 9.Hyperlipidemia. Lipitor 10.Tobacco abuse. Counseling,  NicoDerm patch 11. 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  Insomnia improved on trazodone 50mg  LOS (Days) 6 A FACE TO FACE EVALUATION WAS PERFORMED  Erick Colace, MD 03/20/2016 8:02 AM

## 2016-03-21 ENCOUNTER — Inpatient Hospital Stay (HOSPITAL_COMMUNITY): Payer: Self-pay | Admitting: Speech Pathology

## 2016-03-21 ENCOUNTER — Inpatient Hospital Stay (HOSPITAL_COMMUNITY): Payer: Self-pay | Admitting: Physical Therapy

## 2016-03-21 LAB — CREATININE, SERUM
Creatinine, Ser: 0.72 mg/dL (ref 0.61–1.24)
GFR calc Af Amer: >60 mL/min (ref >60)
GFR calc non Af Amer: >60 mL/min (ref >60)

## 2016-03-21 MED ORDER — ASPIRIN 325 MG PO TABS
325.0000 mg | ORAL_TABLET | Freq: Every day | ORAL | Status: DC
  Administered 2016-03-22 – 2016-04-04 (×14): 325 mg via ORAL
  Filled 2016-03-21 (×14): qty 1

## 2016-03-21 NOTE — Progress Notes (Signed)
Speech Language Pathology Daily Session Note  Patient Details  Name: Satira Mccallumntonio Bame MRN: [id number] Date of Birth: 05/25/1965  Today's Date: 03/21/2016 SLP Individual Time: 0930-1000 SLP Individual Time Calculation (min): 30 min  Short Term Goals: Week 1: SLP Short Term Goal 1 (Week 1): Patient will demonstrate use of lingual sweep and oral clearance of Dys.1 textures and thin liquids with Supervision level verbal cues. SLP Short Term Goal 2 (Week 1): Patient will demonstrate effective masticaiton and oral clearance of Dys.2 textures with Min verbal cues over two sessions prior to upgrade.  SLP Short Term Goal 3 (Week 1): Patient will attend to left upper extremity and to left of midline with Mod assist multimodal cues during functional tasks. SLP Short Term Goal 4 (Week 1): Patient will solve problem realted to self-care tasks with Mod assist multimodal cues for self-monitoring and correcting. SLP Short Term Goal 5 (Week 1): Patient will utilize over articulation at the phrase-sentence level of verbal expression with Min assist verbal and non-verbal cues.   Skilled Therapeutic Interventions: Skilled treatment session focused on dysphagia and cognition goals. Interpreter present to facilitate session. SLP facilitated session by providing Min A question cues for pt explanation of recently learned game Advice worker(Trash). Pt able to attend to left of midline with Mod A question cues. Pt consumed trial of dysphagia 3 snack with supervision cues. Pt with complete oral clearing. Pt was left upright in bed with interpreter present. Continue per current plan of care.      Function:  Eating Eating   Modified Consistency Diet: Yes Eating Assist Level: Set up assist for   Eating Set Up Assist For: Opening containers       Cognition Comprehension Comprehension assist level: Follows basic conversation/direction with no assist  Expression   Expression assist level: Expresses basic 75 - 89% of the  time/requires cueing 10 - 24% of the time. Needs helper to occlude trach/needs to repeat words.  Social Interaction Social Interaction assist level: Interacts appropriately 75 - 89% of the time - Needs redirection for appropriate language or to initiate interaction.  Problem Solving Problem solving assist level: Solves basic 50 - 74% of the time/requires cueing 25 - 49% of the time  Memory Memory assist level: Recognizes or recalls 75 - 89% of the time/requires cueing 10 - 24% of the time    Pain Pain Assessment Pain Score: 0-No pain Faces Pain Scale: No hurt  Therapy/Group: Individual Therapy   Tayia Stonesifer B. Dreama Saaverton, M.S., CCC-SLP Speech-Language Pathologist   Jaelah Hauth 03/21/2016, 10:53 AM

## 2016-03-21 NOTE — Progress Notes (Signed)
Physical Therapy Weekly Progress Note  Patient Details  Name: Grant Garcia MRN: [id number] Date of Birth: [birth year]  Beginning of progress report period: March 15, 2016 End of progress report period: March 21, 2016  Today's Date: 03/21/2016 PT Individual Time: 1000-1100 PT Individual Time Calculation (min): 60 min   Patient has met 1 of 3 short term goals.  Pt has made progress with safety awareness and attention during this reporting period which has lead to improvements in transfers and safe ambulation.  Pt continues to require max assist for ambulation with hemiwalker due to L hemiparesis and LLE tone which impedes his ability to effectively advance, place, and stabilize LLE without assist in 75% of opportunities.    Patient continues to demonstrate the following deficits muscle weakness, decreased cardiorespiratoy endurance, abnormal tone, unbalanced muscle activation, decreased coordination and decreased motor planning, decreased attention to left, decreased initiation, decreased attention, decreased awareness, decreased problem solving, decreased safety awareness, decreased memory and delayed processing and decreased sitting balance, decreased standing balance, decreased postural control and decreased balance strategies and therefore will continue to benefit from skilled PT intervention to increase functional independence with mobility.  Patient progressing toward long term goals..  Pt currently has supervision ambulatory level goals for d/c.  Will reassess middle of this reporting period to determine if pt will need to be w/c level at home initially.  PT Short Term Goals Week 1:  PT Short Term Goal 1 (Week 1): Pt will complete bed mobility with min assist. PT Short Term Goal 1 - Progress (Week 1): Progressing toward goal PT Short Term Goal 2 (Week 1): Pt will complete basic functional transfers with min assist. PT Short Term Goal 2 - Progress (Week 1): Met PT Short Term Goal 3  (Week 1): Pt will ambulate 50 ft with LRAD & min assist. PT Short Term Goal 3 - Progress (Week 1): Not met Week 2:  PT Short Term Goal 1 (Week 2): Pt will ambulate 70' with LRAD and mod assist PT Short Term Goal 2 (Week 2): Pt will demonstrate dynamic standing balance with min assist PT Short Term Goal 3 (Week 2): Pt will negotiate 4 6" steps with R rail and max assist for strengthening and NMR  Skilled Therapeutic Interventions/Progress Updates:    no c/o pain.  Session focus on NMR.   Pt completed supine>sit with HOB elevated and min assist to elevate trunk using HOB elevated and bed rails.  Dynamic sitting balance EOB for lower body dressing with supervision to min guard for threading pants/pulling up to knees, and donning shoes.  Sit<>stand for pulling pants up with steady assist to stand but up to mod assist for standing balance.  Pt completes squat/pivot transfers to L and R throughout session with close supervision, requiring cues to scoot hips back in chair rather than sliding off the edge once seated.  NMR in tall kneeling during cup stacking task reaching across midline 2x12 cups, and short kneeling to tall kneeling x10 reps for strengthening endurance, and motor control.  NMR via gait training x90' with increased time and max assist for weight shift and to advance, place, and stabilize LLE.  Pt requires mod/max multimodal cues for ambulation for BOS, sequencing, and upright posture.  Nustep x8 minutes with BLEs and RUE for strengthening and activity tolerance as well as reciprocal stepping pattern retraining.  Pt returned to room at end of session and positioned upright in w/c with QRB in place and family present.   Therapy Documentation  Precautions:  Precautions Precautions: Fall Precaution Comments: L side weakness Restrictions Weight Bearing Restrictions: No   See Function Navigator for Current Functional Status.  Therapy/Group: Individual Therapy  Friend Dorfman E  Penven-Crew 03/21/2016, 11:05 AM

## 2016-03-21 NOTE — Progress Notes (Signed)
  [facility] PHYSICAL MEDICINE & REHABILITATION     PROGRESS NOTE    Subjective/Complaints: Difficulty sleeping last night  Objective: Vital Signs: Blood pressure 137/70, pulse 60, temperature 98.6 F (37 C), temperature source Oral, resp. rate 18, height 5\' 6"  (1.676 m), weight 164 lb 6.4 oz (74.6 kg), SpO2 98 %.  Physical Exam:  nad Chest- cta cv- reg rate abd- soft Ext- no edeam Assessment/Plan: 1. Functional and mobility deficits secondary to right MCA infarct   Medical Problem List and Plan: 1. 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 DVT Prophylaxis/Anticoagulation: Subcutaneous Lovenox. Monitor platelet counts and any signs of bleeding, PLT 162K on 2/19 3. Pain Management: Tylenol as needed 4. Mood: Provide emotional support 5. Neuropsych: This patient iscapable of making decisions on hisown behalf. 6. Skin/Wound Care: Routine skin checks 7. Fluids/Electrolytes/Nutrition: Routine I&O with follow-up chemistries  Basic Metabolic Panel:    Component Value Date/Time   NA 136 03/16/2016 0701   K 3.9 03/16/2016 0701   CL 103 03/16/2016 0701   CO2 25 03/16/2016 0701   BUN 19 03/16/2016 0701   CREATININE 0.72 03/20/2016 2337   GLUCOSE 114 (H) 03/16/2016 0701   CALCIUM 9.3 03/16/2016 0701     8.Dysphagia. Dysphagia 2 thin liquid diet. Monitor for any aspiration. Advance per speech therapy, still coughing and having problem with saliva 9.Hyperlipidemia. Lipitor 10.Tobacco abuse. Counseling,  NicoDerm patch 11. 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  Insomnia improved on trazodone 50mg  (will monitor) LOS (Days) 7 A FACE TO FACE EVALUATION WAS PERFORMED  Lindley MagnusBruce H Darius Lundberg, MD 03/21/2016 10:37 AM

## 2016-03-22 ENCOUNTER — Inpatient Hospital Stay (HOSPITAL_COMMUNITY): Payer: Self-pay

## 2016-03-22 NOTE — Progress Notes (Signed)
Occupational Therapy Session Note  Patient Details  Name: Grant Garcia MRN: [id number] Date of Birth: August 10, 1965  Today's Date: 03/22/2016 OT Individual Time: 0900-1000 OT Individual Time Calculation (min): 60 min   Short Term Goals: Week 1:  OT Short Term Goal 1 (Week 1): Pt will complete upper body dressing with min assist OT Short Term Goal 2 (Week 1): Pt will complete lower body dressing, sitting and standing with mod assist OT Short Term Goal 3 (Week 1): Pt will manage LUE during performance of BADL with min vc OT Short Term Goal 4 (Week 1): Pt will complete transfer to toilet with steadying assist  Skilled Therapeutic Interventions/Progress Updates: ADL-retraining with focus on NMR to maintain postural control, improved left attention, family education (with spouse to inhibit excessive assistance), and adapted dressing skills.   Pt re-educated on bed mobility, rolling to his right with improved management of LUE, stand-pivot transfer (Bobath method, holding LUE at wrist w/right hand), and use of wash mit while bathing.   Pt completed shower sitting and standing to allow assist with washing buttocks thoroughly.   Pt completed transfers with overall min assist for safety, vc to slow down, supervision to manage L-U/LE, and tactile cues to weight-shift to prevent left lateral lean while standing.   Pt requires re-ed on hemi dressing technique during session with noted improved postural control.   Pt required re-ed on need to reposition in w/c to maintain midline orientation at end of session.  Translation assist required to educate family and clarify explanations provided during pt/family ed.     Therapy Documentation Precautions:  Precautions Precautions: Fall Precaution Comments: L side weakness Restrictions Weight Bearing Restrictions: No   Vital Signs: Therapy Vitals Pulse Rate: (!) 54 Resp: 17 BP: (!) 116/59 Patient Position (if appropriate): Lying   Pain: Pain  Assessment Pain Assessment: No/denies pain   See Function Navigator for Current Functional Status.   Therapy/Group: Individual Therapy  Kristian Mogg 03/22/2016, 12:41 PM

## 2016-03-22 NOTE — Progress Notes (Signed)
   PHYSICAL MEDICINE & REHABILITATION     PROGRESS NOTE    Subjective/Complaints: Slept better last night. He has no complaints. Family is in the room with him. He appears quite happy and pleasant.  Objective: Vital Signs: Blood pressure 115/66, pulse 60, temperature 98.2 F (36.8 C), temperature source Oral, resp. rate 18, height 5\' 6"  (1.676 m), weight 164 lb 6.4 oz (74.6 kg), SpO2 94 %.  Physical Exam:  nad Chest- cta cv- reg rate abd- soft Ext- no edeam Assessment/Plan: 1. Functional and mobility deficits secondary to right MCA infarct   Medical Problem List and Plan: 1. 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 DVT Prophylaxis/Anticoagulation: Subcutaneous Lovenox. Monitor platelet counts and any signs of bleeding, PLT 162K on 2/19 3. Pain Management: Tylenol as needed 4. Mood: Provide emotional support 5. Neuropsych: This patient iscapable of making decisions on hisown behalf. 6. Skin/Wound Care: Routine skin checks 7. Fluids/Electrolytes/Nutrition: Routine I&O with follow-up chemistries  Basic Metabolic Panel:    Component Value Date/Time   NA 136 03/16/2016 0701   K 3.9 03/16/2016 0701   CL 103 03/16/2016 0701   CO2 25 03/16/2016 0701   BUN 19 03/16/2016 0701   CREATININE 0.72 03/20/2016 2337   GLUCOSE 114 (H) 03/16/2016 0701   CALCIUM 9.3 03/16/2016 0701     8.Dysphagia. Dysphagia 2 thin liquid diet. Monitor for any aspiration. Advance per speech therapy, still coughing and having problem with saliva 9.Hyperlipidemia. Lipitor 10.Tobacco abuse. Counseling,  NicoDerm patch 11. 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  Insomnia improved on trazodone 50mg  (will monitor) LOS (Days) 8 A FACE TO FACE EVALUATION WAS PERFORMED  Lindley MagnusBruce H Pranavi Aure, MD 03/22/2016 8:47 AM

## 2016-03-23 ENCOUNTER — Inpatient Hospital Stay (HOSPITAL_COMMUNITY): Payer: Self-pay | Admitting: Occupational Therapy

## 2016-03-23 ENCOUNTER — Inpatient Hospital Stay (HOSPITAL_COMMUNITY): Payer: Self-pay | Admitting: Speech Pathology

## 2016-03-23 ENCOUNTER — Inpatient Hospital Stay (HOSPITAL_COMMUNITY): Payer: Self-pay | Admitting: Physical Therapy

## 2016-03-23 NOTE — Progress Notes (Signed)
Occupational Therapy Weekly Progress Note  Patient Details  Name: Grant Garcia MRN: [id number] Date of Birth: 1965-11-30  Beginning of progress report period: March 15, 2016 End of progress report period: March 23, 2016  Today's Date: 03/23/2016 OT Individual Time: 1962-2297 and 9892-1194 OT Individual Time Calculation (min): 60 min and 32 min   Patient has met 3 of 4 short term goals.  Pt is making steady progress towards goals.  Pt currently requires min-mod assistance with stand pivot and squat pivot transfers from w/c level.  Pt continues to demonstrate impulsiveness with mobility as well as self-care tasks, therefore requiring increased verbal cues for safety, sequencing, and problem solving.  Pt reports pain in Lt shoulder with flexion > 90 degrees, plan to increase focus on positioning strategies and NMR.    Patient continues to demonstrate the following deficits: muscle weakness, impaired timing and sequencing, abnormal tone and unbalanced muscle activation, decreased attention to left, decreased awareness, decreased problem solving, decreased safety awareness and decreased memory and decreased standing balance, decreased postural control and hemiplegia and therefore will continue to benefit from skilled OT intervention to enhance overall performance with BADL and Reduce care partner burden.  Patient progressing toward long term goals..  Continue plan of care.  OT Short Term Goals Week 1:  OT Short Term Goal 1 (Week 1): Pt will complete upper body dressing with min assist OT Short Term Goal 1 - Progress (Week 1): Met OT Short Term Goal 2 (Week 1): Pt will complete lower body dressing, sitting and standing with mod assist OT Short Term Goal 2 - Progress (Week 1): Met OT Short Term Goal 3 (Week 1): Pt will manage LUE during performance of BADL with min vc OT Short Term Goal 3 - Progress (Week 1): Met OT Short Term Goal 4 (Week 1): Pt will complete transfer to toilet with  steadying assist OT Short Term Goal 4 - Progress (Week 1): Progressing toward goal Week 2:  OT Short Term Goal 1 (Week 2): Pt will complete toilet transfer with min assist OT Short Term Goal 2 (Week 2): Pt will complete LB dressing with min assist at sit > stand level OT Short Term Goal 3 (Week 2): Pt will complete UB dressing with supervision OT Short Term Goal 4 (Week 2): Pt will complete 1 grooming task in standing with min assist to increase standing balance OT Short Term Goal 5 (Week 2): Pt will utilize LUE as stabilizer with min cues/min assist  Skilled Therapeutic Interventions/Progress Updates:    1) Treatment session with focus on functional mobility, transfers, and LUE NMR.  Pt received in bed deferring bathing and dressing this session.  Completed bed mobility to Rt as pt sleeps on Rt side of bed at home.  Pt requires increased cues for sequencing and attention to LUE/LLE during mobility.  Mod assist stand pivot transfer to Lt.  Completed toilet transfers stand pivot to Rt and Lt with pt able to manage clothing while therapist provided min-mod assist for standing balance.  W/c propulsion in hallway to therapy gym with focus on Lt attention during mobility and problem solving when approaching obstacles.  Engaged in [place] in supine with focus on shoulder abduction/adduction with use of maxi slide to decrease friction, pt demonstrating trace adduction.  Pt reports pain in Lt shoulder with flexion > 90 degrees PROM.  Provided handouts and education regarding shoulder subluxation and positioning to increase support at shoulder joint in sitting and supine.  Left upright in w/c  with half lap tray and quick release belt donned.  2) Treatment session with focus on LUE NMR.  Pt received upright in w/c with no reports of pain.  Therapist pushed pt to therapy gym due to time constraints.  Engaged in [place], with NMES applied to supraspinatus and middle deltoid to help approximate shoulder joint to reduce  sublux and reduce pain.  Pt tolerated Intensity 16 for Duration 7 min with good shoulder movement and no adverse reactions.  Explained plan to continue to incorporate e-stim into treatment sessions as appropriate.  Provided pt with self-ROM handout, education and demonstration of shoulder flexion/extension, abduction/adduction, and internal/external rotation with pt and wife.  Pt required mod multimodal cues for appropriate handling, technique, and speed of movement.  Left upright in w/c with half lap tray and quick release belt donned.  Therapy Documentation Precautions:  Precautions Precautions: Fall Precaution Comments: L side weakness Restrictions Weight Bearing Restrictions: No General:   Vital Signs: Therapy Vitals Temp: 98.6 F (37 C) Temp Source: Oral Pulse Rate: 73 Resp: 16 BP: 125/72 Patient Position (if appropriate): Lying Oxygen Therapy SpO2: 96 % O2 Device: Not Delivered Pain:  Pt with no c/o pain  See Function Navigator for Current Functional Status.   Therapy/Group: Individual Therapy  Simonne Come 03/23/2016, 7:28 AM

## 2016-03-23 NOTE — Progress Notes (Signed)
PRN trazodone given at HS-+/- sleep. Alfredo MartinezJamie Daneen Volcy, RN

## 2016-03-23 NOTE — Progress Notes (Addendum)
Physical Therapy Session Note  Patient Details  Name: Grant Garcia MRN: [id number] Date of Birth: 1965/03/27  Today's Date: 03/23/2016 PT Individual Time: 1005-1102 PT Individual Time Calculation (min): 57 min   Short Term Goals: Week 2:  PT Short Term Goal 1 (Week 2): Pt will ambulate 35' with LRAD and mod assist PT Short Term Goal 2 (Week 2): Pt will demonstrate dynamic standing balance with min assist PT Short Term Goal 3 (Week 2): Pt will negotiate 4 6" steps with R rail and max assist for strengthening and NMR  Skilled Therapeutic Interventions/Progress Updates:    no c/o pain.  Session focus on w/c mobility, transfers, NMR for trunk/LLE, and gait training.    Pt propels w/c to and from therapy gym with R hemi technique and supervision with cues for increased speed.  Pt performs squat/pivot to R and L during session with min cues for w/c set up and close supervision for transfer.  NMR focus on equal weight bearing through BLEs, no UE support, and attaining/maintaining postural alignment in standing with repeated sit<>stand.  Attempted x3 sit<>stand with biofeedback board to encourage equal weight bearing but pt unable to extend L knee and bear weight equally on uneven surface.  Sit<>stand x4 with static stance focus on L quad activation and upright posture with PT providing total fade to max facilitation at L quad.  Gait training x90' with hemiwalker and initial mod assist, fade to max assist due to fatigue.  Pt initially able to slide LLE through to take a step, but noted to have increasing difficulty with fatigue.  W/C mobility through cones focus on L attention and visual scanning to avoid obstacles in tight environment.  Pt returned to room at end of session and positioned in w/c with QRB in place, call bell in reach and needs met.   Therapy Documentation Precautions:  Precautions Precautions: Fall Precaution Comments: L side weakness Restrictions Weight Bearing Restrictions:  No   See Function Navigator for Current Functional Status.   Therapy/Group: Individual Therapy  Kimm Ungaro E Penven-Crew 03/23/2016, 11:03 AM

## 2016-03-23 NOTE — Progress Notes (Signed)
[facility]ILITATION     PROGRESS NOTE    Subjective/Complaints: Trying to move RUE  Appetite ok Sleeping well per pt ROS- no available , lnaguage  Objective: Vital Signs: Blood pressure 125/72, pulse 73, temperature 98.6 F (37 C), temperature source Oral, resp. rate 16, height 5\' 6"  (1.676 m), weight 74.6 kg (164 lb 6.4 oz), SpO2 96 %. No results found. No results for input(s): WBC, HGB, HCT, PLT in the last 72 hours.  Recent Labs  03/20/16 2337  CREATININE 0.72   CBG (last 3)  No results for input(s): GLUCAP in the last 72 hours.  Wt Readings from Last 3 Encounters:  03/14/16 74.6 kg (164 lb 6.4 oz)  03/13/16 73.6 kg (162 lb 4.1 oz)    Physical Exam:  Constitutional: He appears well-developed. No distress.  HENT:  Head: Normocephalicand atraumatic.  Eyes: EOMare normal. Left eye exhibits no discharge.  Neck: Normal range of motion. Neck supple. No JVDpresent. No thyromegalypresent.  Cardiovascular: RRR No murmurheard. Respiratory:CTA without wheezes GI: Soft. Bowel sounds are normal. He exhibits no distension. There is no tenderness. There is no rebound.  Musculoskeletal: Normal range of motion.No pain with shoulder range of motion  Skin: Skin is warmand dry. He is not diaphoretic.  Skin. Warm and dry Neurological:   Speech sl dysarthric.. RUE and RLE motor 5/5. LUE remain0/5 prox to distal with mildflexor tone pattern LUE.LLE 2+ hip /knee extension  Left inattention.Decreased sensation to LT/withdrawl to pain in LLE and withdraws to pinch. Left central 7. Fair insight and awareness.   Assessment/Plan: 1. Functional and mobility deficits secondary to right MCA infarct which require 3+ hours per day of interdisciplinary therapy in a comprehensive inpatient rehab setting. Physiatrist is providing close team supervision and 24 hour management of active medical problems listed below. Physiatrist and rehab team continue to assess  barriers to discharge/monitor patient progress toward functional and medical goals.  Function:  Bathing Bathing position   Position: Shower  Bathing parts Body parts bathed by patient: Left arm, Chest, Abdomen, Front perineal area, Right upper leg, Left upper leg, Right lower leg Body parts bathed by helper: Back, Buttocks  Bathing assist Assist Level: Touching or steadying assistance(Pt > 75%)      Upper Body Dressing/Undressing Upper body dressing   What is the patient wearing?: Pull over shirt/dress     Pull over shirt/dress - Perfomed by patient: Thread/unthread right sleeve, Put head through opening, Thread/unthread left sleeve Pull over shirt/dress - Perfomed by helper: Pull shirt over trunk        Upper body assist Assist Level: Touching or steadying assistance(Pt > 75%), Supervision or verbal cues      Lower Body Dressing/Undressing Lower body dressing   What is the patient wearing?: [employer], Shoes, Pants Underwear - Performed by patient: Thread/unthread left underwear leg Underwear - Performed by helper: Thread/unthread right underwear leg, Pull underwear up/down Pants- Performed by patient: Thread/unthread right pants leg, Thread/unthread left pants leg Pants- Performed by helper: Pull pants up/down   Non-skid slipper socks- Performed by helper: Don/doff right sock, Don/doff left sock   Socks - Performed by helper: Don/doff right sock, Don/doff left sock Shoes - Performed by patient: Don/doff right shoe, Fasten right Shoes - Performed by helper: Don/doff left shoe, Fasten left       TED Hose - Performed by helper: Don/doff right TED hose, Don/doff left TED hose  Lower body assist Assist for lower body dressing:  (Max assist)  Toileting Toileting Toileting activity did not occur: No continent bowel/bladder event Toileting steps completed by patient: Adjust clothing prior to toileting, Performs perineal hygiene, Adjust clothing after toileting Toileting  steps completed by helper: Adjust clothing prior to toileting, Performs perineal hygiene, Adjust clothing after toileting    Toileting assist Assist level: Two helpers   Transfers Chair/bed transfer   Chair/bed transfer method: Stand pivot Chair/bed transfer assist level: Touching or steadying assistance (Pt > 75%) Chair/bed transfer assistive device: Armrests     Locomotion Ambulation     Max distance: 8535ft Assist level: Moderate assist (Pt 50 - 74%)   Wheelchair   Type: Manual Max wheelchair distance: 26400ft  Assist Level: Supervision or verbal cues  Cognition Comprehension Comprehension assist level: Understands complex 90% of the time/cues 10% of the time  Expression Expression assist level: Expresses basic 75 - 89% of the time/requires cueing 10 - 24% of the time. Needs helper to occlude trach/needs to repeat words.  Social Interaction Social Interaction assist level: Interacts appropriately 75 - 89% of the time - Needs redirection for appropriate language or to initiate interaction.  Problem Solving Problem solving assist level: Solves basic 50 - 74% of the time/requires cueing 25 - 49% of the time  Memory Memory assist level: Recognizes or recalls 75 - 89% of the time/requires cueing 10 - 24% of the time   Medical Problem List and Plan: 1. 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 DVT Prophylaxis/Anticoagulation: Subcutaneous Lovenox. Monitor platelet counts and any signs of bleeding, PLT 162K on 2/19, Creat nl 2/23 3. Pain Management: Tylenol as needed 4. Mood: Provide emotional support 5. Neuropsych: This patient iscapable of making decisions on hisown behalf. 6. Skin/Wound Care: Routine skin checks 7. Fluids/Electrolytes/Nutrition: Routine I&O with follow-up chemistries -encourage PO-  8.Dysphagia. Dysphagia 3 thin  liquid diet. Monitor for any aspiration. Advance per speech therapy, 9.Hyperlipidemia. Lipitor 10.Tobacco abuse. Counseling,  NicoDerm patch 11. 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  Insomnia improved on trazodone 50mg  LOS (Days) 9 A FACE TO FACE EVALUATION WAS PERFORMED  Erick ColaceKIRSTEINS,Rolene Andrades E, MD 03/23/2016 8:11 AM

## 2016-03-23 NOTE — Progress Notes (Signed)
Speech Language Pathology Weekly Progress and Session Note  Patient Details  Name: Grant Garcia MRN: [id number] Date of Birth: [birth year]  Beginning of progress report period: March 16, 2016 End of progress report period: March 23, 2016  Today's Date: 03/23/2016 SLP Individual Time: 3845-3646 SLP Individual Time Calculation (min): 45 min  Short Term Goals: Week 1: SLP Short Term Goal 1 (Week 1): Patient will demonstrate use of lingual sweep and oral clearance of Dys.1 textures and thin liquids with Supervision level verbal cues. SLP Short Term Goal 1 - Progress (Week 1): Met SLP Short Term Goal 2 (Week 1): Patient will demonstrate effective masticaiton and oral clearance of Dys.2 textures with Min verbal cues over two sessions prior to upgrade.  SLP Short Term Goal 2 - Progress (Week 1): Met SLP Short Term Goal 3 (Week 1): Patient will attend to left upper extremity and to left of midline with Mod assist multimodal cues during functional tasks. SLP Short Term Goal 3 - Progress (Week 1): Met SLP Short Term Goal 4 (Week 1): Patient will solve problem realted to self-care tasks with Mod assist multimodal cues for self-monitoring and correcting. SLP Short Term Goal 4 - Progress (Week 1): Met SLP Short Term Goal 5 (Week 1): Patient will utilize over articulation at the phrase-sentence level of verbal expression with Min assist verbal and non-verbal cues.  SLP Short Term Goal 5 - Progress (Week 1): Met    New Short Term Goals: Week 2: SLP Short Term Goal 1 (Week 2): Patient will utilize over articulation at the phrase-sentence level of verbal expression with supervision assist verbal and non-verbal cues.  SLP Short Term Goal 2 (Week 2): Patient will solve problem related to self-care tasks with Min assist multimodal cues for self-monitoring and correcting. SLP Short Term Goal 3 (Week 2): Patient will attend to left upper extremity and to left of midline with supervision assist  multimodal cues during functional tasks. SLP Short Term Goal 4 (Week 2): Patient will recall new, daily information with Min A verbal cues.  SLP Short Term Goal 5 (Week 2): Patient will demonstrate effective masticaiton and oral clearance of regular textures with Min verbal cues over two sessions prior to upgrade.  SLP Short Term Goal 6 (Week 2): Patient will self-monitor and correct left anterior spillage of saliva with Min A verbal cues.   Weekly Progress Updates: Patient has made excellent gains and has met 5 of 5 STG's this reporting period. Currently, patient is consuming Dys. 3 textures with thin liquids without overt s/s of aspiration and requires supervision verbal cues for use of swallowing compensatory strategies. Patient continues to require Mod A verbal cues to self-monitor and correct left anterior spillage of saliva during functional tasks. Patient also requires overall Min A to complete functional and familiar tasks safely in regards to problem solving, attention to left field of environment, recall of new information and emergent awareness. Patient also requires Min A verbal cues for use of speech intelligibility strategies to achieve 90% intelligibility at the sentence level. Patient and family education ongoing. Patient would benefit from continued skilled SLP intervention to maximize his cognitive and swallowing function and overall functional independence prior to discharge.      Intensity: Minumum of 1-2 x/day, 30 to 90 minutes Frequency: 3 to 5 out of 7 days Duration/Length of Stay: 3/10 Treatment/Interventions: Cognitive remediation/compensation;Cueing hierarchy;Dysphagia/aspiration precaution training;Environmental controls;Functional tasks;Internal/external aids;Patient/family education;Speech/Language facilitation;Therapeutic Activities   Daily Session  Skilled Therapeutic Interventions: Skilled treatment session focused on cognitive and  dysphagia goals. Patient propelled  himself to and from the dayroom with extra time but was Mod I for attention to left field of environment. However, while self-feeding his lunch meal, patient required Min A to attend to LUE. Patient consumed his trial lunch meal of Dys. 3 textures with thin liquids without overt s/s of aspiration. Patient demonstrated efficient mastication with minimal oral residue. Therefore, recommend patient upgrade to Dys. 3 textures. Patient left upright in wheelchair with all needs within reach and family present. Continue with current plan of care.     Function:   Eating Eating   Modified Consistency Diet: Yes Eating Assist Level: Set up assist for;Supervision or verbal cues   Eating Set Up Assist For: Opening containers       Cognition Comprehension Comprehension assist level: Understands complex 90% of the time/cues 10% of the time  Expression   Expression assist level: Expresses basic 75 - 89% of the time/requires cueing 10 - 24% of the time. Needs helper to occlude trach/needs to repeat words.  Social Interaction Social Interaction assist level: Interacts appropriately 75 - 89% of the time - Needs redirection for appropriate language or to initiate interaction.  Problem Solving Problem solving assist level: Solves basic 75 - 89% of the time/requires cueing 10 - 24% of the time  Memory Memory assist level: Recognizes or recalls 75 - 89% of the time/requires cueing 10 - 24% of the time   Pain No/Denies Pain   Therapy/Group: Individual Therapy  Julissa Browning 03/23/2016, 3:40 PM

## 2016-03-24 ENCOUNTER — Inpatient Hospital Stay (HOSPITAL_COMMUNITY): Payer: Self-pay | Admitting: Physical Therapy

## 2016-03-24 ENCOUNTER — Inpatient Hospital Stay (HOSPITAL_COMMUNITY): Payer: Self-pay | Admitting: Occupational Therapy

## 2016-03-24 ENCOUNTER — Inpatient Hospital Stay (HOSPITAL_COMMUNITY): Payer: Self-pay | Admitting: Speech Pathology

## 2016-03-24 NOTE — Progress Notes (Signed)
Speech Language Pathology Daily Session Note  Patient Details  Name: Satira Mccallumntonio Leonor MRN: [id number] Date of Birth: 14-Feb-1965  Today's Date: 03/24/2016 SLP Individual Time: 1300-1345 SLP Individual Time Calculation (min): 45 min  Short Term Goals: Week 2: SLP Short Term Goal 1 (Week 2): Patient will utilize over articulation at the phrase-sentence level of verbal expression with supervision assist verbal and non-verbal cues.  SLP Short Term Goal 2 (Week 2): Patient will solve problem related to self-care tasks with Min assist multimodal cues for self-monitoring and correcting. SLP Short Term Goal 3 (Week 2): Patient will attend to left upper extremity and to left of midline with supervision assist multimodal cues during functional tasks. SLP Short Term Goal 4 (Week 2): Patient will recall new, daily information with Min A verbal cues.  SLP Short Term Goal 5 (Week 2): Patient will demonstrate effective masticaiton and oral clearance of regular textures with Min verbal cues over two sessions prior to upgrade.  SLP Short Term Goal 6 (Week 2): Patient will self-monitor and correct left anterior spillage of saliva with Min A verbal cues.   Skilled Therapeutic Interventions: Skilled treatment session focused on addressing dysphagia and cognition goals. SLP facilitated session by providing skilled observation of patient consuming Dys.3 textures and thin liquids via straw with Supervision level verbal cues to manage left buccal pocketing and Mod verbal cues to recall strategy of wiping mouth every 2-3 bites due to sensory deficits with anterior loss of boluses.  Patient with no observed s/s of aspiration throughout meal.  SLP also facilitated session with a mildly complex new learning task that patient recalled procedures to with Mod faded to Min verbal, question cues.  Patient required Supervision level verbal cues to scan left during today's structured table top tasks.  Continue with current plan of  care.    Function:  Eating Eating   Modified Consistency Diet: Yes Eating Assist Level: Set up assist for;Supervision or verbal cues   Eating Set Up Assist For: Opening containers       Cognition Comprehension Comprehension assist level: Understands complex 90% of the time/cues 10% of the time  Expression   Expression assist level: Expresses basic 75 - 89% of the time/requires cueing 10 - 24% of the time. Needs helper to occlude trach/needs to repeat words.  Social Interaction Social Interaction assist level: Interacts appropriately 75 - 89% of the time - Needs redirection for appropriate language or to initiate interaction.  Problem Solving Problem solving assist level: Solves basic 75 - 89% of the time/requires cueing 10 - 24% of the time  Memory Memory assist level: Recognizes or recalls 75 - 89% of the time/requires cueing 10 - 24% of the time    Pain Pain Assessment Pain Assessment: No/denies pain  Therapy/Group: Individual Therapy  Charlane FerrettiMelissa Deandrae Wajda, M.A., CCC-SLP [phone]  Danely Bayliss 03/24/2016, 4:36 PM

## 2016-03-24 NOTE — Plan of Care (Signed)
Problem: RH SAFETY Goal: RH STG ADHERE TO SAFETY PRECAUTIONS W/ASSISTANCE/DEVICE STG Adhere to Safety Precautions With  Min. Assistance/Device.  Outcome: Not Progressing Bed in lowest position with call light and personal belongings with reach. Family at beside around the clock.

## 2016-03-24 NOTE — Progress Notes (Signed)
Occupational Therapy Session Note  Patient Details  Name: Grant Garcia MRN: [id number] Date of Birth: Aug 25, 1965  Today's Date: 03/24/2016 OT Individual Time: [id number] and [id number] OT Individual Time Calculation (min): 60 min and 30 min   Short Term Goals: Week 2:  OT Short Term Goal 1 (Week 2): Pt will complete toilet transfer with min assist OT Short Term Goal 2 (Week 2): Pt will complete LB dressing with min assist at sit > stand level OT Short Term Goal 3 (Week 2): Pt will complete UB dressing with supervision OT Short Term Goal 4 (Week 2): Pt will complete 1 grooming task in standing with min assist to increase standing balance OT Short Term Goal 5 (Week 2): Pt will utilize LUE as stabilizer with min cues/min assist  Skilled Therapeutic Interventions/Progress Updates:    1) Treatment session with focus on ADL retraining, sit > stand, transfers, and attention to Lt side during mobility and self-care tasks.  Completed bed mobility with cues for sequencing and positioning of LUE and LLE during transitional movement.  Stand pivot transfer mod assist with pt unable to sequence squat pivot.  Completed transfers to/from toilet with min-mod assist, followed by transfer into shower with mod assist.  Pt continues to demonstrate LOB to Lt during dynamic sitting tasks, requiring mod assist to correct.  Mod multimodal cues for hemi-dressing technique with improved sequencing and pacing of activity when crossing Lt leg over Rt knee.  Left positioned in w/c with half lap tray and quick release belt donned.  2) Treatment session with focus on LUE NMR.  Pt received in bed finishing session with SLP.  Per report, pt having just received aid to promote BM - therefore plan to stay in room for session.  Engaged in FES, with NMES applied to supraspinatus and middle deltoid to help approximate shoulder joint to reduce sublux and reduce pain.  Pt tolerated Intensity 19 for Duration 10 min with good shoulder  movement and no adverse reactions. Pt's interpreter reports that pt has been tearful after therapy sessions and occasionally during session.  Engaged in discussion regarding stroke recovery and increase in emotions due to loss of independence.  Discussed neuropsychologist services with pt not recalling previous visit. Provided descriptions and time seen with pt and wife able to recall.  Discussed utilizing medical as well as family supports during this time.  Therapy Documentation Precautions:  Precautions Precautions: Fall Precaution Comments: L side weakness Restrictions Weight Bearing Restrictions: No General:   Vital Signs: Therapy Vitals Temp: 98.2 F (36.8 C) Temp Source: Oral Pulse Rate: 78 Resp: 16 BP: 133/66 Patient Position (if appropriate): Lying Oxygen Therapy SpO2: 97 % O2 Device: Not Delivered Pain: Pain Assessment Pain Assessment: No/denies pain Pain Score: 0-No pain  See Function Navigator for Current Functional Status.   Therapy/Group: Individual Therapy  [name], [name] 03/24/2016, 9:35 AM

## 2016-03-24 NOTE — Progress Notes (Signed)
[facility] PHYSICAL MEDICINE & REHABILITATION     PROGRESS NOTE    Subjective/Complaints:  No issues noted  Sleeping well per pt ROS-interpreter not available , language  Objective: Vital Signs: Blood pressure 133/66, pulse 78, temperature 98.2 F (36.8 C), temperature source Oral, resp. rate 16, height 5\' 6"  (1.676 m), weight 74.6 kg (164 lb 6.4 oz), SpO2 97 %. No results found. No results for input(s): WBC, HGB, HCT, PLT in the last 72 hours. No results for input(s): NA, K, CL, GLUCOSE, BUN, CREATININE, CALCIUM in the last 72 hours.  Invalid input(s): CO CBG (last 3)  No results for input(s): GLUCAP in the last 72 hours.  Wt Readings from Last 3 Encounters:  03/14/16 74.6 kg (164 lb 6.4 oz)  03/13/16 73.6 kg (162 lb 4.1 oz)    Physical Exam:  Constitutional: He appears well-developed. No distress.  HENT:  Head: Normocephalicand atraumatic.  Eyes: EOMare normal. Left eye exhibits no discharge.  Neck: Normal range of motion. Neck supple. No JVDpresent. No thyromegalypresent.  Cardiovascular: RRR No murmurheard. Respiratory:CTA without wheezes GI: Soft. Bowel sounds are normal. He exhibits no distension. There is no tenderness. There is no rebound.  Musculoskeletal: Normal range of motion.No pain with shoulder range of motion  Skin: Skin is warmand dry. He is not diaphoretic.  Skin. Warm and dry Neurological:   Speech sl dysarthric.. RUE and RLE motor 5/5. LUE remain0/5 prox to distal with mildflexor tone pattern LUE.LLE 2+ hip /knee extension  Left inattention.Decreased sensation to LT/withdrawl to pain in LLE and withdraws to pinch. Left central 7. Fair insight and awareness.   Assessment/Plan: 1. Functional and mobility deficits secondary to right MCA infarct which require 3+ hours per day of interdisciplinary therapy in a comprehensive inpatient rehab setting. Physiatrist is providing close team supervision and 24 hour management of active medical  problems listed below. Physiatrist and rehab team continue to assess barriers to discharge/monitor patient progress toward functional and medical goals.  Function:  Bathing Bathing position   Position: Shower  Bathing parts Body parts bathed by patient: Left arm, Chest, Abdomen, Front perineal area, Right upper leg, Left upper leg, Right lower leg Body parts bathed by helper: Back, Buttocks  Bathing assist Assist Level: Touching or steadying assistance(Pt > 75%)      Upper Body Dressing/Undressing Upper body dressing   What is the patient wearing?: Pull over shirt/dress     Pull over shirt/dress - Perfomed by patient: Thread/unthread right sleeve, Put head through opening, Thread/unthread left sleeve Pull over shirt/dress - Perfomed by helper: Pull shirt over trunk        Upper body assist Assist Level: Touching or steadying assistance(Pt > 75%), Supervision or verbal cues      Lower Body Dressing/Undressing Lower body dressing   What is the patient wearing?: [employer], Shoes, Pants Underwear - Performed by patient: Thread/unthread left underwear leg Underwear - Performed by helper: Thread/unthread right underwear leg, Pull underwear up/down Pants- Performed by patient: Thread/unthread right pants leg, Thread/unthread left pants leg Pants- Performed by helper: Pull pants up/down   Non-skid slipper socks- Performed by helper: Don/doff right sock, Don/doff left sock   Socks - Performed by helper: Don/doff right sock, Don/doff left sock Shoes - Performed by patient: Don/doff right shoe Shoes - Performed by helper: Don/doff left shoe       TED Hose - Performed by helper: Don/doff right TED hose, Don/doff left TED hose  Lower body assist Assist for lower body dressing:  (  Max assist)      Financial traderToileting Toileting Toileting activity did not occur: Refused (no bm) Toileting steps completed by patient: Performs perineal hygiene, Adjust clothing prior to toileting, Adjust clothing  after toileting Toileting steps completed by helper: Adjust clothing prior to toileting, Performs perineal hygiene, Adjust clothing after toileting    Toileting assist Assist level: Touching or steadying assistance (Pt.75%)   Transfers Chair/bed transfer   Chair/bed transfer method: Stand pivot Chair/bed transfer assist level: Moderate assist (Pt 50 - 74%/lift or lower) Chair/bed transfer assistive device: Armrests     Locomotion Ambulation     Max distance: 4635ft Assist level: Moderate assist (Pt 50 - 74%)   Wheelchair   Type: Manual Max wheelchair distance: 23400ft  Assist Level: Supervision or verbal cues  Cognition Comprehension Comprehension assist level: Understands complex 90% of the time/cues 10% of the time  Expression Expression assist level: Expresses basic 75 - 89% of the time/requires cueing 10 - 24% of the time. Needs helper to occlude trach/needs to repeat words.  Social Interaction Social Interaction assist level: Interacts appropriately 75 - 89% of the time - Needs redirection for appropriate language or to initiate interaction.  Problem Solving Problem solving assist level: Solves basic 75 - 89% of the time/requires cueing 10 - 24% of the time  Memory Memory assist level: Recognizes or recalls 75 - 89% of the time/requires cueing 10 - 24% of the time   Medical Problem List and Plan: 1. 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 DVT Prophylaxis/Anticoagulation: Subcutaneous Lovenox. Monitor platelet counts and any signs of bleeding, PLT 162K on 2/19, Creat nl 2/23 3. Pain Management: Tylenol as needed 4. Mood: Provide emotional support 5. Neuropsych: This patient iscapable of making decisions on hisown behalf. 6. Skin/Wound Care: Routine skin checks 7. Fluids/Electrolytes/Nutrition: Routine I&O with follow-up  chemistries -encourage PO-  8.Dysphagia. Dysphagia 3 thin liquid diet. Monitor for any aspiration. Advance per speech therapy, 9.Hyperlipidemia. Lipitor 10.Tobacco abuse. Counseling,  NicoDerm patch 11. 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  Insomnia improved on trazodone 50mg  LOS (Days) 10 A FACE TO FACE EVALUATION WAS PERFORMED  Erick ColaceKIRSTEINS,Zyaira Vejar E, MD 03/24/2016 7:25 AM

## 2016-03-24 NOTE — Progress Notes (Signed)
Physical Therapy Session Note  Patient Details  Name: Grant Garcia MRN: [id number] Date of Birth: 1966/01/12  Today's Date: 03/24/2016 PT Individual Time: 1000-1100 PT Individual Time Calculation (min): 60 min   Short Term Goals: Week 2:  PT Short Term Goal 1 (Week 2): Pt will ambulate 9350' with LRAD and mod assist PT Short Term Goal 2 (Week 2): Pt will demonstrate dynamic standing balance with min assist PT Short Term Goal 3 (Week 2): Pt will negotiate 4 6" steps with R rail and max assist for strengthening and NMR  Skilled Therapeutic Interventions/Progress Updates:    no c/o pain.  Session focus on gait training with RW/hand splint, transfers, w/c propulsion, and NMR.   Pt propels w/c throughout unit with R hemi technique with increased time.  Stand/pivots throughout session with min guard and verbal cues for safety set up/sequencing.  Pt requiring increased cues for upright posture in sitting to reduce risk of sliding out of w/c today.  Nustep x10 minutes with RUE and BLEs for reciprocal stepping pattern retraining with focus on maintaining hip adduction on LLE.  Gait training 2x30' with RW with L hand splint with max assist.  Pt demos significant L lean, especially when advancing LLE, difficulty advancing LLE due to tone and muscle weakness, and inability to maintain upright posture without max facilitation from therapist.  Pt with some improvement initially with mirror feedback.  Pt returned to room at end of session and positioned with QRB in place and call bell in reach.   Therapy Documentation Precautions:  Precautions Precautions: Fall Precaution Comments: L side weakness Restrictions Weight Bearing Restrictions: No   See Function Navigator for Current Functional Status.   Therapy/Group: Individual Therapy  Ladora DanielCaitlin E Penven-Crew 03/24/2016, 3:30 PM

## 2016-03-25 ENCOUNTER — Inpatient Hospital Stay (HOSPITAL_COMMUNITY): Payer: Self-pay | Admitting: Speech Pathology

## 2016-03-25 ENCOUNTER — Inpatient Hospital Stay (HOSPITAL_COMMUNITY): Payer: Self-pay | Admitting: Occupational Therapy

## 2016-03-25 ENCOUNTER — Inpatient Hospital Stay (HOSPITAL_COMMUNITY): Payer: Self-pay | Admitting: Physical Therapy

## 2016-03-25 MED ORDER — CITALOPRAM HYDROBROMIDE 10 MG PO TABS
10.0000 mg | ORAL_TABLET | Freq: Every day | ORAL | Status: DC
  Administered 2016-03-25 – 2016-04-04 (×11): 10 mg via ORAL
  Filled 2016-03-25 (×11): qty 1

## 2016-03-25 MED ORDER — SENNOSIDES-DOCUSATE SODIUM 8.6-50 MG PO TABS
1.0000 | ORAL_TABLET | Freq: Two times a day (BID) | ORAL | Status: DC
  Administered 2016-03-25 – 2016-04-04 (×21): 1 via ORAL
  Filled 2016-03-25 (×21): qty 1

## 2016-03-25 MED ORDER — BISACODYL 10 MG RE SUPP
10.0000 mg | Freq: Every day | RECTAL | Status: DC | PRN
  Administered 2016-03-25: 10 mg via RECTAL
  Filled 2016-03-25: qty 1

## 2016-03-25 NOTE — Progress Notes (Signed)
[facility] PHYSICAL MEDICINE & REHABILITATION     PROGRESS NOTE    Subjective/Complaints:  Pt without new issues, working with OT, wheeling himself down the hall  ROS-interpreter not available , language  Objective: Vital Signs: Blood pressure 115/69, pulse 76, temperature 98.5 F (36.9 C), temperature source Oral, resp. rate 18, height 5' 6"  (1.676 m), weight 74.6 kg (164 lb 6.4 oz), SpO2 99 %. No results found. No results for input(s): WBC, HGB, HCT, PLT in the last 72 hours. No results for input(s): NA, K, CL, GLUCOSE, BUN, CREATININE, CALCIUM in the last 72 hours.  Invalid input(s): CO CBG (last 3)  No results for input(s): GLUCAP in the last 72 hours.  Wt Readings from Last 3 Encounters:  03/14/16 74.6 kg (164 lb 6.4 oz)  03/13/16 73.6 kg (162 lb 4.1 oz)    Physical Exam:  Constitutional: He appears well-developed. No distress.  HENT:  Head: Normocephalicand atraumatic.  Eyes: EOMare normal. Left eye exhibits no discharge.  Neck: Normal range of motion. Neck supple. No JVDpresent. No thyromegalypresent.  Cardiovascular: RRR No murmurheard. Respiratory:CTA without wheezes GI: Soft. Bowel sounds are normal. He exhibits no distension. There is no tenderness. There is no rebound.  Musculoskeletal: Normal range of motion.No pain with shoulder range of motion  Skin: Skin is warmand dry. He is not diaphoretic.  Skin. Warm and dry Neurological:   Speech sl dysarthric.. RUE and RLE motor 5/5. LUE remain0/5 prox to distal with mildflexor tone pattern LUE.LLE 2+ hip /knee extension  Left inattention.Decreased sensation to LT/withdrawl to pain in LLE and withdraws to pinch. Left central 7. Fair insight and awareness.   Assessment/Plan: 1. Functional and mobility deficits secondary to right MCA infarct which require 3+ hours per day of interdisciplinary therapy in a comprehensive inpatient rehab setting. Physiatrist is providing close team supervision and 24 hour  management of active medical problems listed below. Physiatrist and rehab team continue to assess barriers to discharge/monitor patient progress toward functional and medical goals.  Function:  Bathing Bathing position   Position: Shower  Bathing parts Body parts bathed by patient: Left arm, Chest, Abdomen, Front perineal area, Right upper leg, Left upper leg, Right lower leg Body parts bathed by helper: Left lower leg, Back, Buttocks, Right arm  Bathing assist Assist Level:  (Mod assist)      Upper Body Dressing/Undressing Upper body dressing   What is the patient wearing?: Pull over shirt/dress     Pull over shirt/dress - Perfomed by patient: Thread/unthread right sleeve, Put head through opening, Thread/unthread left sleeve Pull over shirt/dress - Perfomed by helper: Pull shirt over trunk        Upper body assist Assist Level: Touching or steadying assistance(Pt > 75%)      Lower Body Dressing/Undressing Lower body dressing   What is the patient wearing?: Underwear, Pants, Shoes, [employer]  - Performed by patient: Thread/unthread right underwear leg, Pull underwear up/down Underwear - Performed by helper: Thread/unthread left underwear leg Pants- Performed by patient: Thread/unthread right pants leg, Pull pants up/down Pants- Performed by helper: Thread/unthread left pants leg   Non-skid slipper socks- Performed by helper: Don/doff right sock, Don/doff left sock   Socks - Performed by helper: Don/doff right sock, Don/doff left sock Shoes - Performed by patient: Don/doff right shoe Shoes - Performed by helper: Don/doff left shoe       TED Hose - Performed by helper: Don/doff right TED hose, Don/doff left TED hose  Lower body assist Assist for  lower body dressing:  (Mod assist)      Toileting Toileting Toileting activity did not occur: Refused (no bm) Toileting steps completed by patient: Performs perineal hygiene, Adjust clothing prior to toileting, Adjust  clothing after toileting Toileting steps completed by helper: Adjust clothing prior to toileting, Performs perineal hygiene, Adjust clothing after toileting Toileting Assistive Devices: Grab bar or rail  Toileting assist Assist level: Touching or steadying assistance (Pt.75%)   Transfers Chair/bed transfer   Chair/bed transfer method: (P) Stand pivot Chair/bed transfer assist level: (P) Moderate assist (Pt 50 - 74%/lift or lower) Chair/bed transfer assistive device: Armrests     Locomotion Ambulation     Max distance: 7f Assist level: Moderate assist (Pt 50 - 74%)   Wheelchair   Type: Manual Max wheelchair distance: 2057f Assist Level: Supervision or verbal cues  Cognition Comprehension Comprehension assist level: Understands complex 90% of the time/cues 10% of the time  Expression Expression assist level: Expresses basic 75 - 89% of the time/requires cueing 10 - 24% of the time. Needs helper to occlude trach/needs to repeat words.  Social Interaction Social Interaction assist level: Interacts appropriately 75 - 89% of the time - Needs redirection for appropriate language or to initiate interaction.  Problem Solving Problem solving assist level: Solves basic 75 - 89% of the time/requires cueing 10 - 24% of the time  Memory Memory assist level: Recognizes or recalls 75 - 89% of the time/requires cueing 10 - 24% of the time   Medical Problem List and Plan: 1. 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 Formulized individual treatment plan based on medical history, underlying problem and comorbidities.   2. DVT Prophylaxis/Anticoagulation: Subcutaneous Lovenox. Monitor platelet counts and any signs of  bleeding, PLT 162K on 2/19, Creat nl 2/23 3. Pain Management: Tylenol as needed 4. Mood: Provide emotional support 5. Neuropsych: This patient iscapable of making decisions on hisown behalf. 6. Skin/Wound Care: Routine skin checks 7. Fluids/Electrolytes/Nutrition: Routine I&O with follow-up chemistries -Good po intake 8.Dysphagia. Dysphagia 3 thin liquid diet. Monitor for any aspiration. Advance per speech therapy, 9.Hyperlipidemia. Lipitor 10.Tobacco abuse. Counseling,  NicoDerm patch 11. 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  Insomnia improved on trazodone 5063mcont current dose LOS (Days) 11 A FACE TO FACE EVALUATION WAS PERFORMED  KIRCharlett BlakeD 03/25/2016 8:38 AM

## 2016-03-25 NOTE — Consult Note (Addendum)
Patient:  Grant Garcia   DOB: 03-10-1965  MR Number: [id number]  [facility] [address] [id number] [place] [id number] Dept: [phone] Loc: [phone]  Start: 2:15 PM End: 3 PM  Provider/Observer:        Chief Complaint:     No chief complaint on file.   Reason For Service:     The patient was referred for psychological/neuropsychological consultation.  The patient is a 51 yo right handed male.  He speaks [place] and very little [place].  The patient had a Right Hemisphere stroke with residual left-sided weakness.  His expressive and reception language appear intact.  The patient and his wife report a prior history of anxiety.  He reports he would worry about "everything" but especially about his wife and her diabietes.  She confirms this level of worry and anxiety but he never had any formal treatment for this.  He reports that this worry and anxiety intrude on his immediate thoughts and sometimes make it hard for him to focus on the task at hand.  Interventions Strategy:  Psychological therapeutic interventions including building coping skills and adaptive skills and cognitive/behavioral therapeutic interventions.  Participation Level:   Active  Participation Quality:  Appropriate and Attentive      Behavioral Observation:  Well Groomed, Alert, and Tearful.   Current Psychosocial Factors: The patient has been very concerned about what can happen with him financially and medically after discharge. He and his wife are very concerned about how they're going to cope since he was the primary breadwinner for the family and he is not going to be able to return to work. This is causing an excessive amount of stress and worry for both the patient and his wife.  Content of Session:   Reviewed current symptoms and worked on therapeutic interventions to build Pharmacologistcoping skills. The patient has been  increasingly depressed.   Current Status:   The patient has been experiencing increasing symptoms of depression and anxiety directly related to his medical status and his paralysis.  Patient Progress:   The patient has been developing some strength with his leg functions but there are still severely impaired and he is not able to walk without significant maximum assistance. The patient has been developing increasing symptoms of depression with significant worry about what's going to happen to him financially and occupationally going forward.  Target Goals:   Target goals include building and continuing to develop coping skills.  Last Reviewed:   03/25/2016   Impression/Diagnosis:   The patient has had a signficant multifocal right MCA territory infarct.  He has lost most function of left arm and left leg.  He had a pre-existing anxiety disorder likely, but this has exacerbated his anxiety.

## 2016-03-25 NOTE — Progress Notes (Signed)
Speech Language Pathology Daily Session Note  Patient Details  Name: Grant Garcia MRN: [id number] Date of Birth: Dec 03, 1965  Today's Date: 03/25/2016 SLP Individual Time: 1030-1100 SLP Individual Time Calculation (min): 30 min  Short Term Goals: Week 2: SLP Short Term Goal 1 (Week 2): Patient will utilize over articulation at the phrase-sentence level of verbal expression with supervision assist verbal and non-verbal cues.  SLP Short Term Goal 2 (Week 2): Patient will solve problem related to self-care tasks with Min assist multimodal cues for self-monitoring and correcting. SLP Short Term Goal 3 (Week 2): Patient will attend to left upper extremity and to left of midline with supervision assist multimodal cues during functional tasks. SLP Short Term Goal 4 (Week 2): Patient will recall new, daily information with Min A verbal cues.  SLP Short Term Goal 5 (Week 2): Patient will demonstrate effective masticaiton and oral clearance of regular textures with Min verbal cues over two sessions prior to upgrade.  SLP Short Term Goal 6 (Week 2): Patient will self-monitor and correct left anterior spillage of saliva with Min A verbal cues.   Skilled Therapeutic Interventions: Skilled treatment session focused on dysphagia and cognitive goals. SLP facilitated session by providing Min-Mod A verbal cues for recall of events from previous therapy sessions as patient confusing OT and SLP sessions. Patient consumed trials of regular textures and demonstrated efficient mastication without oral residue and no overt s/s of aspiration. Recommend trial tray prior to upgrade. Patient left upright in wheelchair with all needs within reach. Continue with current plan of care.      Function:  Eating Eating   Modified Consistency Diet: No (with trials from SLP) Eating Assist Level: Set up assist for;Supervision or verbal cues   Eating Set Up Assist For: Opening containers       Cognition Comprehension  Comprehension assist level: Understands complex 90% of the time/cues 10% of the time  Expression   Expression assist level: Expresses basic 90% of the time/requires cueing < 10% of the time.  Social Interaction Social Interaction assist level: Interacts appropriately 75 - 89% of the time - Needs redirection for appropriate language or to initiate interaction.  Problem Solving Problem solving assist level: Solves basic 75 - 89% of the time/requires cueing 10 - 24% of the time  Memory Memory assist level: Recognizes or recalls 75 - 89% of the time/requires cueing 10 - 24% of the time    Pain Pain Assessment Pain Assessment: No/denies pain  Therapy/Group: Individual Therapy  Nature Kueker 03/25/2016, 3:40 PM

## 2016-03-25 NOTE — Progress Notes (Signed)
Physical Therapy Session Note  Patient Details  Name: Grant Garcia MRN: [id number] Date of Birth: Oct 21, 1965  Today's Date: 03/25/2016 PT Individual Time: 1000-1030 PT Individual Time Calculation (min): 30 min   Short Term Goals: Week 2:  PT Short Term Goal 1 (Week 2): Pt will ambulate 71' with LRAD and mod assist PT Short Term Goal 2 (Week 2): Pt will demonstrate dynamic standing balance with min assist PT Short Term Goal 3 (Week 2): Pt will negotiate 4 6" steps with R rail and max assist for strengthening and NMR  Skilled Therapeutic Interventions/Progress Updates:    no c/o pain.  Session focus on LLE NMR, sitting posture, and w/c mobility.    PT propelled pt to therapy gym for time management.  NMR for LLE quad/hamstring/hip flexor kicking a soccer ball from seated position.  Pt requires mod cues to maintain sitting posture and not slide down in w/c.  Pt propelled w/c back to room mod I.  Left upright with QRB and half lap tray in place, call bell in reach and needs met.   Therapy Documentation Precautions:  Precautions Precautions: Fall Precaution Comments: L side weakness Restrictions Weight Bearing Restrictions: No   See Function Navigator for Current Functional Status.   Therapy/Group: Individual Therapy  Earnest Conroy Penven-Crew 03/25/2016, 12:08 PM

## 2016-03-25 NOTE — Plan of Care (Signed)
Problem: RH BLADDER ELIMINATION Goal: RH STG MANAGE BLADDER WITH ASSISTANCE STG Manage Bladder With mod Assistance  Outcome: Progressing Maintaining bladder continence.

## 2016-03-25 NOTE — Plan of Care (Signed)
Problem: RH BOWEL ELIMINATION Goal: RH STG MANAGE BOWEL WITH ASSISTANCE STG Manage Bowel with Min  Assistance.   Outcome: Not Progressing Required max assist with suppository today Goal: RH STG MANAGE BOWEL W/MEDICATION W/ASSISTANCE STG Manage Bowel with Medication with min Assistance.  Outcome: Not Progressing Required suppository

## 2016-03-25 NOTE — Progress Notes (Signed)
Physical Therapy Session Note  Patient Details  Name: Grant Garcia MRN: [id number] Date of Birth: 28-Nov-1965  Today's Date: 03/25/2016 PT Individual Time: 1300-1410 PT Individual Time Calculation (min): 70 min   Short Term Goals: Week 2:  PT Short Term Goal 1 (Week 2): Pt will ambulate 6350' with LRAD and mod assist PT Short Term Goal 2 (Week 2): Pt will demonstrate dynamic standing balance with min assist PT Short Term Goal 3 (Week 2): Pt will negotiate 4 6" steps with R rail and max assist for strengthening and NMR  Skilled Therapeutic Interventions/Progress Updates:   no c/o pain.  Session focus on LLE NMR with mirror feedback, balance, transfers, and gait with RW.    Pt propels w/c to and from therapy gym mod I.  Squat/pivot bed>w/c>therapy mat to L and R with steady assist.  NMES to L quad (10 minutes at 13-15 intensity, 35 pps, 300 pulse width, 15 seconds on, 10 seconds off) in sitting and standing focus on quad activation.  Pt performs 2x10 reps minisquats with mod facilitation at L knee for controlled flexion and full extension.  Static stance x2 trials focus on midline orientation, posture, and L quad activation.  Applied GRF AFO to LLE with noted improvement in L knee control in static standing and during ambulation.  Sit<>squat x8 reps focus on anterior weight shift, glute activation, and equal weight bearing through BLEs.  Gait training x60' with RW with L handsplint and mod assist overall with pt demonstrating significant improvement in self awareness of posture/midline orientation and L swing through/knee control in stance phase.  Pt returned to room in w/c at end of session and positioned upright with call bell in reach, QRB/half lap tray in place.    Therapy Documentation Precautions:  Precautions Precautions: Fall Precaution Comments: L side weakness Restrictions Weight Bearing Restrictions: No   See Function Navigator for Current Functional Status.   Therapy/Group:  Individual Therapy  Ladora DanielCaitlin E Penven-Crew 03/25/2016, 2:19 PM

## 2016-03-25 NOTE — Progress Notes (Signed)
Occupational Therapy Session Note  Patient Details  Name: Grant Garcia MRN: [id number] Date of Birth: April 14, 1965  Today's Date: 03/25/2016 OT Individual Time: 0865-78460830-0930 OT Individual Time Calculation (min): 60 min    Short Term Goals: Week 2:  OT Short Term Goal 1 (Week 2): Pt will complete toilet transfer with min assist OT Short Term Goal 2 (Week 2): Pt will complete LB dressing with min assist at sit > stand level OT Short Term Goal 3 (Week 2): Pt will complete UB dressing with supervision OT Short Term Goal 4 (Week 2): Pt will complete 1 grooming task in standing with min assist to increase standing balance OT Short Term Goal 5 (Week 2): Pt will utilize LUE as stabilizer with min cues/min assist  Skilled Therapeutic Interventions/Progress Updates:     Treatment session with focus on LUE NMR and trunk control.  Pt received upright in w/c reporting having a good night and reports desire to defer bathing this session.  Pt propelled w/c to therapy gym with hemi-technique and increased time.  Engaged in tall kneeling on therapy mat with UE on bench with focus on trunk control, weight shifting, and weight bearing through LUE.  Min assist for trunk control and to facilitate weight shift while also promoting increased weight bearing through LUE.  Incorporated reaching outside BOS and crossing midline to challenge trunk control and increase weight shift over LUE. Noted improvement in hip extension post tall kneeling activity, still mostly flaccid UE.  Engaged in FES, with NMESapplied to supraspinatus and middle deltoid to help approximate shoulder joint to reduce sublux and reduce pain.Pt tolerated Intensity 19 for Duration 10 min with good shoulder movement and no adverse reactions.  Educated on proper handling of LUE, as pt and pt's wife will adjust UE by grasping fingers.  Returned to room as above and left with quick release belt on and half lap tray for improved support of LUE.  Therapy  Documentation Precautions:  Precautions Precautions: Fall Precaution Comments: L side weakness Restrictions Weight Bearing Restrictions: No Pain:  Pt with no c/o pain  See Function Navigator for Current Functional Status.   Therapy/Group: Individual Therapy  [name], Grant Garcia 03/25/2016, 9:57 AM

## 2016-03-26 ENCOUNTER — Inpatient Hospital Stay (HOSPITAL_COMMUNITY): Payer: Self-pay | Admitting: Physical Therapy

## 2016-03-26 ENCOUNTER — Inpatient Hospital Stay (HOSPITAL_COMMUNITY): Payer: Self-pay

## 2016-03-26 ENCOUNTER — Inpatient Hospital Stay (HOSPITAL_COMMUNITY): Payer: Self-pay | Admitting: Occupational Therapy

## 2016-03-26 ENCOUNTER — Inpatient Hospital Stay (HOSPITAL_COMMUNITY): Payer: Self-pay | Admitting: Speech Pathology

## 2016-03-26 DIAGNOSIS — F418 Other specified anxiety disorders: Secondary | ICD-10-CM

## 2016-03-26 NOTE — Progress Notes (Signed)
Speech Language Pathology Daily Session Note  Patient Details  Name: Grant Garcia MRN: [id number] Date of Birth: Apr 07, 1965  Today's Date: 03/26/2016 SLP Individual Time: 1030-1100 SLP Individual Time Calculation (min): 30 min  Short Term Goals: Week 2: SLP Short Term Goal 1 (Week 2): Patient will utilize over articulation at the phrase-sentence level of verbal expression with supervision assist verbal and non-verbal cues.  SLP Short Term Goal 2 (Week 2): Patient will solve problem related to self-care tasks with Min assist multimodal cues for self-monitoring and correcting. SLP Short Term Goal 3 (Week 2): Patient will attend to left upper extremity and to left of midline with supervision assist multimodal cues during functional tasks. SLP Short Term Goal 4 (Week 2): Patient will recall new, daily information with Min A verbal cues.  SLP Short Term Goal 5 (Week 2): Patient will demonstrate effective masticaiton and oral clearance of regular textures with Min verbal cues over two sessions prior to upgrade.  SLP Short Term Goal 6 (Week 2): Patient will self-monitor and correct left anterior spillage of saliva with Min A verbal cues.   Skilled Therapeutic Interventions: Skilled treatment session focused on cognitive goals. SLP facilitated session by providing Min A verbal cues for recall of his current medications and supervision verbal cues for organization of a BID pill box. Patient propelled himself to and from his room with Mod I. Patient left upright in wheelchair with family present. Continue with current plan of care.      Function:    Cognition Comprehension Comprehension assist level: Understands complex 90% of the time/cues 10% of the time  Expression   Expression assist level: Expresses basic 90% of the time/requires cueing < 10% of the time.  Social Interaction Social Interaction assist level: Interacts appropriately 75 - 89% of the time - Needs redirection for appropriate  language or to initiate interaction.  Problem Solving Problem solving assist level: Solves basic 90% of the time/requires cueing < 10% of the time  Memory Memory assist level: Recognizes or recalls 90% of the time/requires cueing < 10% of the time    Pain No/Denies Pain   Therapy/Group: Individual Therapy  Daune Divirgilio 03/26/2016, 3:24 PM

## 2016-03-26 NOTE — Progress Notes (Signed)
Social Work Patient ID: Grant Garcia, male   DOB: [birth year], 51 y.o.   MRN: [id number]   CSW met with pt and his wife with the assistance of Spanish Interpreter to update them on team conference discussion. Pt's d/c date has stayed the same at 04-04-16.  Pt and wife both report progress.  CSW reiterated that pt is not going to be able to return to work right when he goes home and may not be able to do the same type of work he was doing prior to his stroke.  Pt is very worried about getting back to work and providing for his family.  Pt has good family support and his employer has been very supportive, as well.  CSW will continue to follow and assist as needed.

## 2016-03-26 NOTE — Progress Notes (Signed)
[facility]ILIT[facility]     PROGRESS NOTE    Subjective/Complaints:  DIscussed starting antidepressant,   ROS-interpreter not available , language  Objective: Vital Signs: Blood pressure 127/77, pulse 61, temperature 98.3 F (36.8 C), temperature source Oral, resp. rate 18, height 5\' 6"  (1.676 m), weight 67.9 kg (149 lb 9.6 oz), SpO2 96 %. No results found. No results for input(s): WBC, HGB, HCT, PLT in the last 72 hours. No results for input(s): NA, K, CL, GLUCOSE, BUN, CREATININE, CALCIUM in the last 72 hours.  Invalid input(s): CO CBG (last 3)  No results for input(s): GLUCAP in the last 72 hours.  Wt Readings from Last 3 Encounters:  03/25/16 67.9 kg (149 lb 9.6 oz)  03/13/16 73.6 kg (162 lb 4.1 oz)    Physical Exam:  Constitutional: He appears well-developed. No distress.  HENT:  Head: Normocephalicand atraumatic.  Eyes: EOMare normal. Left eye exhibits no discharge.  Neck: Normal range of motion. Neck supple. No JVDpresent. No thyromegalypresent.  Cardiovascular: RRR No murmurheard. Respiratory:CTA without wheezes GI: Soft. Bowel sounds are normal. He exhibits no distension. There is no tenderness. There is no rebound.  Musculoskeletal: Normal range of motion.No pain with shoulder range of motion  Skin: Skin is warmand dry. He is not diaphoretic.  Skin. Warm and dry Neurological:   Speech sl dysarthric.. RUE and RLE motor 5/5. LUE remain0/5 prox to distal with mildflexor tone pattern LUE.LLE 2+ hip /knee extension  Left inattention.Decreased sensation to LT/withdrawl to pain in LLE and withdraws to pinch. Left central 7. Fair insight and awareness.   Assessment/Plan: 1. Functional and mobility deficits secondary to right MCA infarct which require 3+ hours per day of interdisciplinary therapy in a comprehensive inpatient rehab setting. Physiatrist is providing close team supervision and 24 hour management of active medical problems  listed below. Physiatrist and rehab team continue to assess barriers to discharge/monitor patient progress toward functional and medical goals.  Function:  Bathing Bathing position   Position: Shower  Bathing parts Body parts bathed by patient: Left arm, Chest, Abdomen, Front perineal area, Right upper leg, Left upper leg, Right lower leg Body parts bathed by helper: Left lower leg, Back, Buttocks, Right arm  Bathing assist Assist Level:  (Mod assist)      Upper Body Dressing/Undressing Upper body dressing   What is the patient wearing?: Pull over shirt/dress     Pull over shirt/dress - Perfomed by patient: Thread/unthread right sleeve, Put head through opening, Thread/unthread left sleeve Pull over shirt/dress - Perfomed by helper: Pull shirt over trunk        Upper body assist Assist Level: Touching or steadying assistance(Pt > 75%)      Lower Body Dressing/Undressing Lower body dressing   What is the patient wearing?: Underwear, Pants, Shoes, [employer] Hose Underwear - Performed by patient: Thread/unthread right underwear leg, Pull underwear up/down Underwear - Performed by helper: Thread/unthread left underwear leg Pants- Performed by patient: Thread/unthread right pants leg, Pull pants up/down Pants- Performed by helper: Thread/unthread left pants leg   Non-skid slipper socks- Performed by helper: Don/doff right sock, Don/doff left sock   Socks - Performed by helper: Don/doff right sock, Don/doff left sock Shoes - Performed by patient: Don/doff right shoe Shoes - Performed by helper: Don/doff left shoe       TED Hose - Performed by helper: Don/doff right TED hose, Don/doff left TED hose  Lower body assist Assist for lower body dressing:  (Mod assist)  Toileting Toileting Toileting activity did not occur: Refused (no bm) Toileting steps completed by patient: Performs perineal hygiene, Adjust clothing prior to toileting, Adjust clothing after toileting Toileting  steps completed by helper: Adjust clothing prior to toileting, Performs perineal hygiene, Adjust clothing after toileting Toileting Assistive Devices: Grab bar or rail  Toileting assist Assist level: Touching or steadying assistance (Pt.75%)   Transfers Chair/bed transfer   Chair/bed transfer method: Stand pivot Chair/bed transfer assist level: Moderate assist (Pt 50 - 74%/lift or lower) Chair/bed transfer assistive device: Armrests     Locomotion Ambulation     Max distance: 34ft Assist level: Moderate assist (Pt 50 - 74%)   Wheelchair   Type: Manual Max wheelchair distance: 242ft  Assist Level: Supervision or verbal cues  Cognition Comprehension Comprehension assist level: Understands complex 90% of the time/cues 10% of the time  Expression Expression assist level: Expresses basic 90% of the time/requires cueing < 10% of the time.  Social Interaction Social Interaction assist level: Interacts appropriately 75 - 89% of the time - Needs redirection for appropriate language or to initiate interaction.  Problem Solving Problem solving assist level: Solves basic 75 - 89% of the time/requires cueing 10 - 24% of the time  Memory Memory assist level: Recognizes or recalls 75 - 89% of the time/requires cueing 10 - 24% of the time   Medical Problem List and Plan: 1. 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 DVT Prophylaxis/Anticoagulation: Subcutaneous Lovenox. Monitor platelet counts and any signs of bleeding, PLT 162K on 2/19, Creat nl 2/23 3. Pain Management: Tylenol as needed 4. Mood: Provide emotional support, neuropsych following 5. Neuropsych: This patient iscapable of making decisions on hisown behalf. 6. Skin/Wound Care: Routine skin checks 7. Fluids/Electrolytes/Nutrition: Routine I&O with follow-up chemistries -Good po  intake 8.Dysphagia. Dysphagia 3 thin liquid diet. Monitor for any aspiration. Advance per speech therapy, 9.Hyperlipidemia. Lipitor 10.Tobacco abuse. Counseling,  NicoDerm patch 11. 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  Insomnia improved on trazodone 50mg , cont current dose 13.  Adjustment disorder and anxiety , will start celexa LOS (Days) 12 A FACE TO FACE EVALUATION WAS PERFORMED  Erick Colace, MD 03/26/2016 7:51 AM

## 2016-03-26 NOTE — Progress Notes (Signed)
Occupational Therapy Session Note  Patient Details  Name: Satira Mccallumntonio [place] MRN: [id number] Date of Birth: 1965-02-15  Today's Date: 03/26/2016 OT Individual Time: [id number] and 1350-1420 OT Individual Time Calculation (min): 57 min and 30 min   Short Term Goals: Week 2:  OT Short Term Goal 1 (Week 2): Pt will complete toilet transfer with min assist OT Short Term Goal 2 (Week 2): Pt will complete LB dressing with min assist at sit > stand level OT Short Term Goal 3 (Week 2): Pt will complete UB dressing with supervision OT Short Term Goal 4 (Week 2): Pt will complete 1 grooming task in standing with min assist to increase standing balance OT Short Term Goal 5 (Week 2): Pt will utilize LUE as stabilizer with min cues/min assist  Skilled Therapeutic Interventions/Progress Updates:    1) Treatment session with focus on functional transfers, standing posture, and hemi-technique during ADL retraining.  Pt received on toilet.  Ambulated to room shower from toilet with max assist, requiring manual facilitation for advancement of LLE.  Bathing completed at sit > stand with use of wash mitt on LUE, requiring hand over hand assist to incorporate LUE into bathing.  Min assist for standing balance and cues for attention to LLE in standing.  Pt demonstrating increased ankle movement and positioning to LLE to don shorts in sitting while reaching down towards foot to thread pant leg.  Noted improvements in standing this session, requiring min assist for standing balance while pulling pants over hips.  Provided family with home measurement sheet, discussing important measurements and layout of bathroom.  Per wife report, they have walk-in shower and tub/shower, will further assess best option for pt safety and independence.  2) Treatment session with focus on LUE NMR and trunk control.  Pt received in w/c completing PT session.  Pt propelled w/c to therapy gym with hemi-technique at overall Mod I level.  Stand  pivot transfer min assist to Rt to therapy mat.  Engaged in FES, with NMESapplied to supraspinatus and middle deltoid to help approximate shoulder joint to reduce sublux and reduce pain.Pt tolerated Intensity 10922for Duration 10min with good shoulder movement and no adverse reactions.  Engaged in trunk control and LLE strengthening with toe taps progressing to step ups on 4" block with use of RW for UE support.  Educated on proper technique and weight shifting for strengthening.  Min assist for stepping up on to step, followed by mod assist with stepping down - pt requiring increased assist for positioning and advancement of LLE. Transferred back to w/c with mod assist to Lt.  Returned to room as above.  Therapy Documentation Precautions:  Precautions Precautions: Fall Precaution Comments: L side weakness Restrictions Weight Bearing Restrictions: No Pain: Pain Assessment Pain Assessment: No/denies pain Pain Score: 0-No pain  See Function Navigator for Current Functional Status.   Therapy/Group: Individual Therapy  [name], Aybree Lanyon 03/26/2016, 10:29 AM

## 2016-03-26 NOTE — Plan of Care (Signed)
Problem: RH BOWEL ELIMINATION Goal: RH STG MANAGE BOWEL WITH ASSISTANCE STG Manage Bowel with Min  Assistance.   Outcome: Progressing Having Bms with the use of PRN meds and suppository. Goal: RH STG MANAGE BOWEL W/MEDICATION W/ASSISTANCE STG Manage Bowel with Medication with min Assistance.  Outcome: Progressing Having Bms with the use of Prn med and suppository.

## 2016-03-26 NOTE — Progress Notes (Signed)
Physical Therapy Session Note  Patient Details  Name: Grant Garcia MRN: [id number] Date of Birth: [birth year]  Today's Date: 03/26/2016 PT Individual Time: 1005-1035 PT Individual Time Calculation (min): 30 min   Short Term Goals: Week 2:  PT Short Term Goal 1 (Week 2): Pt will ambulate 7450' with LRAD and mod assist PT Short Term Goal 2 (Week 2): Pt will demonstrate dynamic standing balance with min assist PT Short Term Goal 3 (Week 2): Pt will negotiate 4 6" steps with R rail and max assist for strengthening and NMR  Skilled Therapeutic Interventions/Progress Updates:  Pt was sitting up in W/C with wife and translator present in room. Pt self-propelled W/C 220 ft to rehab gym using L side hemi method supervision. Pt required mod A with RW to stand pivot into Nustep turning to R and max A to stand pivot into W/C turning L. Pt performed 11 min on the Nustep level 4 with LLE leg attachment for stability and only using RUE. Pt was returned to room. Pt was left sitting up in W/C with quick release belt on, call bell within reach, and wife & translator present in room. Pt didn't verbalize or express having any pain throughout therapy.      Therapy Documentation Precautions:  Precautions Precautions: Fall Precaution Comments: L side weakness Restrictions Weight Bearing Restrictions: No Pain: Pain Assessment Pain Assessment: No/denies pain Pain Score: 0-No pain    See Function Navigator for Current Functional Status.   Therapy/Group: Individual Therapy  Rudie MeyerJeffrey Galen Malkowski 03/26/2016, 10:44 AM

## 2016-03-26 NOTE — Progress Notes (Signed)
Physical Therapy Note  Patient Details  Name: Satira Mccallumntonio [place] MRN: [id number] Date of Birth: Jan 09, 1966 Today's Date: 03/26/2016  1300-1350, 50 min individual tx Pain:no c/o  [name] present for session.  Pt had not eaten lunch; Toni Amendourtney, SLP stated that his diet had just been upgraded.  Bed moiblity to sit EOB and eat his lunch.  Min VCs for observing swallowing precautions initially, with good follow through.  Min auditory cues needed throughout for pt to attend to L side of tray for drink and napkin.  Gait wearing L AFO with RW , L hand splint with mod assist max cues for technique.  Pt tends to step with RLE before attempting to extend LLE, even with multimmodal cues.Pt left resting in w/c with all needs within reach; family present and stating they would not leave before next therapist arrived.  See function navigator for current status.Wanda Plump.   Zya Finkle 03/26/2016, 12:24 PM

## 2016-03-26 NOTE — Patient Care Conference (Signed)
Inpatient RehabilitationTeam Conference and Plan of Care Update Date: 03/25/2016   Time: 11:05 AM    Patient Name: Grant Garcia      Medical Record Number: [id number]  Date of Birth: Dec 21, 1965 Sex: Male         Room/Bed: 4W22C/4W22C-01 Payor Info: Payor: MEDICAID POTENTIAL / Plan: MEDICAID POTENTIAL / Product Type: *No Product type* /    Admitting Diagnosis: cva  Admit Date/Time:  03/14/2016  4:18 PM Admission Comments: No comment available   Primary Diagnosis:  Right middle cerebral artery stroke [facility]) Principal Problem: Right middle cerebral artery stroke [facility])  Patient Active Problem List   Diagnosis Date Noted  . Left hemiparesis (HCC) 03/18/2016  . Left-sided neglect 03/18/2016  . Right middle cerebral artery stroke (HCC) 03/14/2016  . Smoker   . Hyperlipidemia   . CVA (cerebral vascular accident) (HCC) 03/11/2016  . Respiratory failure [facility])     Expected Discharge Date: Expected Discharge Date: 04/04/16  Team Members Present: Physician leading conference: Dr. Claudette Laws Social Worker Present: Staci Acosta, LCSW Nurse Present: Chana Bode, RN PT Present: Teodoro Kil, PT OT Present: Rosalio Loud, OT SLP Present: Other (comment) Reuel Derby, SLP) Other (Discipline and Name): Arley Phenix, PsyD PPS Coordinator present : Tora Duck, RN, CRRN     Current Status/Progress Goal Weekly Team Focus  Medical   impulsivity, depression, sleep and appetite better  reduce fall risk  constipation management, toileting program   Bowel/Bladder   Continent B/B. LBM 2/25 (smear). Sorbitol given 2/28. Utilitizes urinal during night.  Maintain B/B continence.  Offer toileting Q4HWA and PRN.   Swallow/Nutrition/ Hydration   Dys. 3 textures with thin liquids, Supervision   Supervision   trials of regular textures    ADL's   min-mod assist transfers, mod assist bathing and LB dressing, min assist UB dressing  Supervision bathing, UB dressing, toilet transfers;  Min assist LB dressing, standing and tub/shower transfers  ADL retraining, trunk control, transfers, sit > stand, hemi-technique with bathing and dressing, LUE NMR   Mobility   min/mod for transfers, bed mobility, and balance, up to total for gait.   supervision ambulatory   problem solving, safety awareness, NMR, balance, gait   Communication   Supervision-Mod I  Mod I   use of an increased vocal intensity    Safety/Cognition/ Behavioral Observations  Min-Mod A   Supervision   attention to left, problem solving, safety awareness    Pain   Denies any pain or discomfort.  Minimize pain and discomfort level to <4/10.  Assess pain q shift and prn.   Skin   Skin clean, dry and intact.  No skin breakdown.   Assess skin integrity q shift and prn.    Rehab Goals Patient on target to meet rehab goals: Yes Rehab Goals Revised: none *See Care Plan and progress notes for long and short-term goals.  Barriers to Discharge: see above    Possible Resolutions to Barriers:  See above,consider med management for depression    Discharge Planning/Teaching Needs:  Pt to return to his home with his wife to provide supervision.  Pt's wife is present at pt's bedside regularly, but will have access to family education closer to d/c.   Team Discussion:  Pt has some improvement in LLE in regard to strength, but has some tone there.  Dr. Wynn Banker to consider medication trials vs. Botox.  Pt's sleep has been better and is awareness is a little better.  Pt still leans to the left and  walking may not be safe at home.  Pt is min to mod assist for bathing and dressing and he has been tearful with the OT.  Speech to trial regular textures tomorrow.  His speech, cognition, and recall are all improving.  Pt has not had a BM in several days and he is incontinent of bladder.  Revisions to Treatment Plan:  none   Continued Need for Acute Rehabilitation Level of Care: The patient requires daily medical management by a  physician with specialized training in physical medicine and rehabilitation for the following conditions: Daily direction of a multidisciplinary physical rehabilitation program to ensure safe treatment while eliciting the highest outcome that is of practical value to the patient.: Yes Daily medical management of patient stability for increased activity during participation in an intensive rehabilitation regime.: Yes Daily analysis of laboratory values and/or radiology reports with any subsequent need for medication adjustment of medical intervention for : Neurological problems  Yuriana Gaal, [place] Deck 03/26/2016, 11:05 AM

## 2016-03-27 ENCOUNTER — Inpatient Hospital Stay (HOSPITAL_COMMUNITY): Payer: Self-pay | Admitting: Physical Therapy

## 2016-03-27 ENCOUNTER — Inpatient Hospital Stay (HOSPITAL_COMMUNITY): Payer: Self-pay | Admitting: Speech Pathology

## 2016-03-27 ENCOUNTER — Inpatient Hospital Stay (HOSPITAL_COMMUNITY): Payer: Self-pay | Admitting: Occupational Therapy

## 2016-03-27 MED ORDER — TIZANIDINE HCL 2 MG PO TABS
2.0000 mg | ORAL_TABLET | Freq: Every day | ORAL | Status: DC
  Administered 2016-03-27 – 2016-03-29 (×3): 2 mg via ORAL
  Filled 2016-03-27 (×3): qty 1

## 2016-03-27 NOTE — Progress Notes (Signed)
Physical Therapy Session Note  Patient Details  Name: Grant Garcia MRN: [id number] Date of Birth: [birth year]  Today's Date: 03/27/2016 PT Individual Time: 1000-1100 PT Individual Time Calculation (min): 60 min   Short Term Goals: Week 2:  PT Short Term Goal 1 (Week 2): Pt will ambulate 12' with LRAD and mod assist PT Short Term Goal 2 (Week 2): Pt will demonstrate dynamic standing balance with min assist PT Short Term Goal 3 (Week 2): Pt will negotiate 4 6" steps with R rail and max assist for strengthening and NMR  Skilled Therapeutic Interventions/Progress Updates:    no c/o pain.  Session focus on NMR for LLE, balance, and gait.    Pt propels w/c to and from therapy gym with L hemi technique with increased time.  Blocked practice squat/pivot to R and L with supervision to R and mod fade to min assist to L.  NMR for LLE in sitting with toe taps to 2" box.  Gait training x90' with RW and overall min assist with occasional bouts of mod assist for balance when pt distracted or moving too quickly.  Discussed pt progress during rest break and encouraged pt to continue to pace himself and self correct balance before attempting to take another step.  Gait training back to pt's room 157' with RW with hand splint and min assist.  Pt positioned in bed with supervision, call bell in reach and needs met.   Therapy Documentation Precautions:  Precautions Precautions: Fall Precaution Comments: L side weakness Restrictions Weight Bearing Restrictions: No   See Function Navigator for Current Functional Status.   Therapy/Group: Individual Therapy  Earnest Conroy Penven-Crew 03/27/2016, 12:17 PM

## 2016-03-27 NOTE — Progress Notes (Addendum)
Occupational Therapy Session Note  Patient Details  Name: Grant Garcia MRN: [id number] Date of Birth: October 24, 1965  Today's Date: 03/27/2016 OT Individual Time: 1402-1430 OT Individual Time Calculation (min): 28 min    Short Term Goals: Week 2:  OT Short Term Goal 1 (Week 2): Pt will complete toilet transfer with min assist OT Short Term Goal 2 (Week 2): Pt will complete LB dressing with min assist at sit > stand level OT Short Term Goal 3 (Week 2): Pt will complete UB dressing with supervision OT Short Term Goal 4 (Week 2): Pt will complete 1 grooming task in standing with min assist to increase standing balance OT Short Term Goal 5 (Week 2): Pt will utilize LUE as stabilizer with min cues/min assist  Skilled Therapeutic Interventions/Progress Updates:    1:1 OT session focused on L NMR and functional use of L Ue. OT propelled pt in wc to therapy gym for time management. Stand-pivot transfers >R with Min A/steady A overall. L NMR in gravity eliminated sidelying position with focus on scapula  protraction/retraction, elbow flex/ext, shoulder flex/ext, forearm pronation/supination. Joint input provided to bring pt through full ROM. Pt returned to room at end of session and transferred back to bed in same fashion as above.   Therapy Documentation Precautions:  Precautions Precautions: Fall Precaution Comments: L side weakness Restrictions Weight Bearing Restrictions: No Pain:  denies pain ADL: ADL ADL Comments: see Functional Assessment Tool  See Function Navigator for Current Functional Status.   Therapy/Group: Individual Therapy  [name] 03/27/2016, 2:36 PM

## 2016-03-27 NOTE — Progress Notes (Signed)
[place] PHYSICAL MEDICINE & REHABILITATION     PROGRESS NOTE    Subjective/Complaints: Didn't sleep as well last noc Appreciate MSW note   ROS-interpreter not available , language  Objective: Vital Signs: Blood pressure 126/65, pulse 60, temperature 99.5 F (37.5 C), temperature source Oral, resp. rate 17, height 5\' 6"  (1.676 m), weight 67.9 kg (149 lb 9.6 oz), SpO2 96 %. No results found. No results for input(s): WBC, HGB, HCT, PLT in the last 72 hours. No results for input(s): NA, K, CL, GLUCOSE, BUN, CREATININE, CALCIUM in the last 72 hours.  Invalid input(s): CO CBG (last 3)  No results for input(s): GLUCAP in the last 72 hours.  Wt Readings from Last 3 Encounters:  03/25/16 67.9 kg (149 lb 9.6 oz)  03/13/16 73.6 kg (162 lb 4.1 oz)    Physical Exam:  Constitutional: He appears well-developed. No distress.  HENT:  Head: Normocephalicand atraumatic.  Eyes: EOMare normal. Left eye exhibits no discharge.  Neck: Normal range of motion. Neck supple. No JVDpresent. No thyromegalypresent.  Cardiovascular: RRR No murmurheard. Respiratory:CTA without wheezes GI: Soft. Bowel sounds are normal. He exhibits no distension. There is no tenderness. There is no rebound.  Musculoskeletal: Normal range of motion.No pain with shoulder range of motion  Skin: Skin is warmand dry. He is not diaphoretic.  Skin. Warm and dry Neurological:  . RUE and RLE motor 5/5. LUE 2- Shoulder adduction remain0/5  distal with mildflexor tone pattern LUE.LLE 2+ hip /knee extension  Left inattention.Decreased sensation to LT/withdrawl to pain in LLE and withdraws to pinch. Left central 7. Fair insight and awareness.   Assessment/Plan: 1. Functional and mobility deficits secondary to right MCA infarct which require 3+ hours per day of interdisciplinary therapy in a comprehensive inpatient rehab setting. Physiatrist is providing close team supervision and 24 hour management of active medical  problems listed below. Physiatrist and rehab team continue to assess barriers to discharge/monitor patient progress toward functional and medical goals.  Function:  Bathing Bathing position   Position: Shower  Bathing parts Body parts bathed by patient: Left arm, Chest, Abdomen, Front perineal area, Right upper leg, Left upper leg, Right lower leg Body parts bathed by helper: Right arm, Buttocks, Left lower leg, Back  Bathing assist Assist Level:  (Mod assist)      Upper Body Dressing/Undressing Upper body dressing   What is the patient wearing?: Pull over shirt/dress     Pull over shirt/dress - Perfomed by patient: Thread/unthread right sleeve, Put head through opening, Thread/unthread left sleeve, Pull shirt over trunk Pull over shirt/dress - Perfomed by helper: Pull shirt over trunk        Upper body assist Assist Level: Supervision or verbal cues, Set up   Set up : To obtain clothing/put away  Lower Body Dressing/Undressing Lower body dressing   What is the patient wearing?: Underwear, Pants, Shoes, [employer], AFO Underwear - Performed by patient: Thread/unthread right underwear leg, Pull underwear up/down Underwear - Performed by helper: Thread/unthread left underwear leg Pants- Performed by patient: Thread/unthread right pants leg, Thread/unthread left pants leg, Pull pants up/down Pants- Performed by helper: Thread/unthread left pants leg   Non-skid slipper socks- Performed by helper: Don/doff right sock, Don/doff left sock   Socks - Performed by helper: Don/doff right sock, Don/doff left sock Shoes - Performed by patient: Don/doff right shoe Shoes - Performed by helper: Don/doff left shoe   AFO - Performed by helper: Don/doff left AFO   TED Hose - Performed  by helper: Don/doff left TED hose, Don/doff right TED hose  Lower body assist Assist for lower body dressing:  (Mod assist)      Toileting Toileting Toileting activity did not occur: Refused (no bm) Toileting  steps completed by patient: Performs perineal hygiene, Adjust clothing prior to toileting, Adjust clothing after toileting Toileting steps completed by helper: Adjust clothing prior to toileting, Performs perineal hygiene, Adjust clothing after toileting Toileting Assistive Devices: Grab bar or rail  Toileting assist Assist level: Touching or steadying assistance (Pt.75%)   Transfers Chair/bed transfer   Chair/bed transfer method: Stand pivot Chair/bed transfer assist level: Maximal assist (Pt 25 - 49%/lift and lower) Chair/bed transfer assistive device: Armrests, Patent attorneyWalker     Locomotion Ambulation     Max distance: 20 Assist level: Moderate assist (Pt 50 - 74%)   Wheelchair   Type: Manual Max wheelchair distance: 220 ft Assist Level: Supervision or verbal cues  Cognition Comprehension Comprehension assist level: Understands complex 90% of the time/cues 10% of the time  Expression Expression assist level: Expresses basic 90% of the time/requires cueing < 10% of the time.  Social Interaction Social Interaction assist level: Interacts appropriately 75 - 89% of the time - Needs redirection for appropriate language or to initiate interaction.  Problem Solving Problem solving assist level: Solves basic 90% of the time/requires cueing < 10% of the time  Memory Memory assist level: Recognizes or recalls 75 - 89% of the time/requires cueing 10 - 24% of the time   Medical Problem List and Plan: 1. 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 DVT Prophylaxis/Anticoagulation: Subcutaneous Lovenox. Monitor platelet counts and any signs of bleeding, PLT 162K on 2/19, Creat nl 2/23 3. Pain Management: Tylenol as needed 4. Mood: Provide emotional support, neuropsych following 5. Neuropsych: This patient iscapable of making  decisions on hisown behalf. 6. Skin/Wound Care: Routine skin checks 7. Fluids/Electrolytes/Nutrition: Routine I&O with follow-up chemistries -Good po intake 8.Dysphagia. Dysphagia 3 thin liquid diet. Monitor for any aspiration. Advance per speech therapy, 9.Hyperlipidemia. Lipitor 10.Tobacco abuse. Counseling,  NicoDerm patch 11. 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  Insomnia improved on trazodone 50mg , cont current dose 13.  Adjustment disorder and anxiety , tolerating celexa thus far LOS (Days) 13 A FACE TO FACE EVALUATION WAS PERFORMED  Erick ColaceKIRSTEINS,Bexlee Bergdoll E, MD 03/27/2016 6:02 AM

## 2016-03-27 NOTE — Progress Notes (Signed)
Occupational Therapy Session Note  Patient Details  Name: Grant Garcia MRN: [id number] Date of Birth: 1965-09-30  Today's Date: 03/27/2016 OT Individual Time: 4098-11910830-0930 OT Individual Time Calculation (min): 60 min    Short Term Goals: Week 2:  OT Short Term Goal 1 (Week 2): Pt will complete toilet transfer with min assist OT Short Term Goal 2 (Week 2): Pt will complete LB dressing with min assist at sit > stand level OT Short Term Goal 3 (Week 2): Pt will complete UB dressing with supervision OT Short Term Goal 4 (Week 2): Pt will complete 1 grooming task in standing with min assist to increase standing balance OT Short Term Goal 5 (Week 2): Pt will utilize LUE as stabilizer with min cues/min assist  Skilled Therapeutic Interventions/Progress Updates:    Treatment session with focus on functional transfers and LUE NMR.  Pt received in w/c declining bathing and dressing this session, expressing desire to work on LUE.  Engaged in w/c mobility with hemi-technique as overall supervision - Mod I level.  Completed stand pivot transfer to Rt min assist.  Engaged in FES, with NMESapplied to supraspinatus and middle deltoid to help approximate shoulder joint to reduce sublux and reduce pain.Pt tolerated Intensity 1022for Duration 10min with good shoulder movement and no adverse reactions.  Applied e-stim to bicep with focus on activating elbow flexion.  Pt tolerated Intensity 19 for 5 min with good elbow flexion.  Educated on tub/shower transfer with use of tub transfer bench.  Pt completed squat pivot transfer w/c <> tub bench with min-mod assist and assistance to advance LLE over tub ledge.  Educated on alternative transfer method with walk-in shower (as pt has both), however did not practice this session.  Therapy Documentation Precautions:  Precautions Precautions: Fall Precaution Comments: L side weakness Restrictions Weight Bearing Restrictions: No Pain:  Pt with no c/o pain  See  Function Navigator for Current Functional Status.   Therapy/Group: Individual Therapy  [name], Greene Diodato 03/27/2016, 3:12 PM

## 2016-03-27 NOTE — Progress Notes (Signed)
Speech Language Pathology Daily Session Note  Patient Details  Name: Grant Garcia MRN: [id number] Date of Birth: [birth year]  Today's Date: 03/27/2016 SLP Individual Time: 1300-1345 SLP Individual Time Calculation (min): 45 min  Short Term Goals: Week 2: SLP Short Term Goal 1 (Week 2): Patient will utilize over articulation at the phrase-sentence level of verbal expression with supervision assist verbal and non-verbal cues.  SLP Short Term Goal 2 (Week 2): Patient will solve problem related to self-care tasks with Min assist multimodal cues for self-monitoring and correcting. SLP Short Term Goal 3 (Week 2): Patient will attend to left upper extremity and to left of midline with supervision assist multimodal cues during functional tasks. SLP Short Term Goal 4 (Week 2): Patient will recall new, daily information with Min A verbal cues.  SLP Short Term Goal 5 (Week 2): Patient will demonstrate effective masticaiton and oral clearance of regular textures with Min verbal cues over two sessions prior to upgrade.  SLP Short Term Goal 6 (Week 2): Patient will self-monitor and correct left anterior spillage of saliva with Min A verbal cues.   Skilled Therapeutic Interventions: Skilled treatment session focused on cognitive goals. SLP facilitated session by providing Min A verbal cues for recall of rules and procedures to a previously learned task. Patient also propelled himself to and from his room to the dayroom with extra time but required Min A verbal cues to attend to LUE throughout task. Overall, patient with limited anterior spillage of saliva and only required Min verbal cues to self-correct. Patient left upright in wheelchair with family present. Continue with current plan of care.      Function:   Cognition Comprehension Comprehension assist level: Understands complex 90% of the time/cues 10% of the time  Expression   Expression assist level: Expresses basic 90% of the time/requires cueing  < 10% of the time.  Social Interaction Social Interaction assist level: Interacts appropriately 75 - 89% of the time - Needs redirection for appropriate language or to initiate interaction.  Problem Solving Problem solving assist level: Solves basic 90% of the time/requires cueing < 10% of the time  Memory Memory assist level: Recognizes or recalls 75 - 89% of the time/requires cueing 10 - 24% of the time    Pain No/Denies Pain   Therapy/Group: Individual Therapy  Anthea Udovich 03/27/2016, 3:28 PM

## 2016-03-28 ENCOUNTER — Inpatient Hospital Stay (HOSPITAL_COMMUNITY): Payer: Self-pay | Admitting: Physical Therapy

## 2016-03-28 DIAGNOSIS — F4323 Adjustment disorder with mixed anxiety and depressed mood: Secondary | ICD-10-CM

## 2016-03-28 LAB — CREATININE, SERUM
Creatinine, Ser: 0.93 mg/dL (ref 0.61–1.24)
GFR calc non Af Amer: >60 mL/min (ref >60)

## 2016-03-28 NOTE — Progress Notes (Signed)
[facility] PHYSICAL MEDICINE & REHABILITATION     PROGRESS NOTE    Subjective/Complaints: Pt seen laying in bed this AM.  He slept well overnight.    ROS-interpreter not available , limited by language, but appears to deny CP, SOB, N/V/D  Objective: Vital Signs: Blood pressure (!) 115/58, pulse (!) 51, temperature 98.6 F (37 C), temperature source Oral, resp. rate 18, height 5\' 6"  (1.676 m), weight 67.9 kg (149 lb 9.6 oz), SpO2 95 %. No results found. No results for input(s): WBC, HGB, HCT, PLT in the last 72 hours.  Recent Labs  03/27/16 2337  CREATININE 0.93   CBG (last 3)  No results for input(s): GLUCAP in the last 72 hours.  Wt Readings from Last 3 Encounters:  03/25/16 67.9 kg (149 lb 9.6 oz)  03/13/16 73.6 kg (162 lb 4.1 oz)    Physical Exam:  Constitutional: He appears well-developed. No distress.  HENT: Normocephalicand atraumatic.  Eyes: EOMI. No discharge.  Cardiovascular: RRR. No JVD. Respiratory:CTA. Unlabored.  GI: Soft. Bowel sounds are normal.  Musculoskeletal: No edema. No tenderness.  Skin: Skin is warmand dry. He is not diaphoretic.  Neurological:  Alert Motor. RUE and RLE motor 5/5.  LUE 2/5 Shoulder adduction remain0/5. mAS 1+/4 elbow flexors, wrist/hand 2/4 LLE 3+ hip /knee extension, ADF/PF 0/5 Left central 7.  Fair insight and awareness.   Assessment/Plan: 1. Functional and mobility deficits secondary to right MCA infarct which require 3+ hours per day of interdisciplinary therapy in a comprehensive inpatient rehab setting. Physiatrist is providing close team supervision and 24 hour management of active medical problems listed below. Physiatrist and rehab team continue to assess barriers to discharge/monitor patient progress toward functional and medical goals.  Function:  Bathing Bathing position   Position: Shower  Bathing parts Body parts bathed by patient: Left arm, Chest, Abdomen, Front perineal area, Right upper leg, Left  upper leg, Right lower leg Body parts bathed by helper: Right arm, Buttocks, Left lower leg, Back  Bathing assist Assist Level:  (Mod assist)      Upper Body Dressing/Undressing Upper body dressing   What is the patient wearing?: Pull over shirt/dress     Pull over shirt/dress - Perfomed by patient: Thread/unthread right sleeve, Put head through opening, Thread/unthread left sleeve, Pull shirt over trunk Pull over shirt/dress - Perfomed by helper: Pull shirt over trunk        Upper body assist Assist Level: Supervision or verbal cues, Set up   Set up : To obtain clothing/put away  Lower Body Dressing/Undressing Lower body dressing   What is the patient wearing?: Underwear, Pants, Shoes, [employer], AFO Underwear - Performed by patient: Thread/unthread right underwear leg, Pull underwear up/down Underwear - Performed by helper: Thread/unthread left underwear leg Pants- Performed by patient: Thread/unthread right pants leg, Thread/unthread left pants leg, Pull pants up/down Pants- Performed by helper: Thread/unthread left pants leg   Non-skid slipper socks- Performed by helper: Don/doff right sock, Don/doff left sock   Socks - Performed by helper: Don/doff right sock, Don/doff left sock Shoes - Performed by patient: Don/doff right shoe Shoes - Performed by helper: Don/doff left shoe   AFO - Performed by helper: Don/doff left AFO   TED Hose - Performed by helper: Don/doff left TED hose, Don/doff right TED hose  Lower body assist Assist for lower body dressing:  (Mod assist)      Toileting Toileting Toileting activity did not occur: Refused (no bm) Toileting steps completed by patient: Performs  perineal hygiene, Adjust clothing prior to toileting, Adjust clothing after toileting Toileting steps completed by helper: Adjust clothing prior to toileting, Performs perineal hygiene, Adjust clothing after toileting Toileting Assistive Devices: Grab bar or rail  Toileting assist Assist  level: Touching or steadying assistance (Pt.75%)   Transfers Chair/bed transfer   Chair/bed transfer method: Stand pivot Chair/bed transfer assist level: Maximal assist (Pt 25 - 49%/lift and lower) Chair/bed transfer assistive device: Armrests, Patent attorneyWalker     Locomotion Ambulation     Max distance: 20 Assist level: Moderate assist (Pt 50 - 74%)   Wheelchair   Type: Manual Max wheelchair distance: 220 ft Assist Level: Supervision or verbal cues  Cognition Comprehension Comprehension assist level: Understands complex 90% of the time/cues 10% of the time  Expression Expression assist level: Expresses basic 90% of the time/requires cueing < 10% of the time.  Social Interaction Social Interaction assist level: Interacts appropriately 75 - 89% of the time - Needs redirection for appropriate language or to initiate interaction.  Problem Solving Problem solving assist level: Solves basic 90% of the time/requires cueing < 10% of the time  Memory Memory assist level: Recognizes or recalls 75 - 89% of the time/requires cueing 10 - 24% of the time   Medical Problem List and Plan: 1. 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 DVT Prophylaxis/Anticoagulation: Subcutaneous Lovenox. Monitor platelet counts and any signs of bleeding 3. Pain Management: Tylenol as needed 4. Mood: Provide emotional support, neuropsych following 5. Neuropsych: This patient iscapable of making decisions on hisown behalf. 6. Skin/Wound Care: Routine skin checks 7. Fluids/Electrolytes/Nutrition: Routine I&Os 8.Dysphagia. Advanced to regular diet 9.Hyperlipidemia. Lipitor 10.Tobacco abuse. Counseling,  NicoDerm patch 11. 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  Insomnia improved on trazodone 50mg , cont current dose 13.  Adjustment disorder and anxiety , tolerating celexa   LOS (Days)  14 A FACE TO FACE EVALUATION WAS PERFORMED  Ankit Karis JubaAnil Patel, MD 03/28/2016 9:29 PM

## 2016-03-28 NOTE — Progress Notes (Signed)
Physical Therapy Weekly Progress Note  Patient Details  Name: Grant Garcia MRN: [id number] Date of Birth: [birth year]  Beginning of progress report period: March 21, 2016 End of progress report period: March 28, 2016  Today's Date: 03/28/2016 PT Individual Time: 1015-1100 PT Individual Time Calculation (min): 45 min   Patient has met 2 of 3 short term goals.  Pt has made excellent progress this reporting period and has progressed to needing supervision/min assist for transfers and ambulating with RW with L hand splint with min>mod assist up to 160'.  Pt continues to demonstrate some impairment with safety awareness as well as impairments in muscle activation, balance, and coordination which increase his risk of falling.   Patient continues to demonstrate the following deficits muscle weakness, abnormal tone, unbalanced muscle activation and decreased coordination, decreased attention to left, decreased awareness and decreased safety awareness and decreased standing balance, decreased postural control and decreased balance strategies and therefore will continue to benefit from skilled PT intervention to increase functional independence with mobility.  Patient progressing toward long term goals..  Continue plan of care.  PT Short Term Goals Week 2:  PT Short Term Goal 1 (Week 2): Pt will ambulate 59' with LRAD and mod assist PT Short Term Goal 1 - Progress (Week 2): Met PT Short Term Goal 2 (Week 2): Pt will demonstrate dynamic standing balance with min assist PT Short Term Goal 2 - Progress (Week 2): Met PT Short Term Goal 3 (Week 2): Pt will negotiate 4 6" steps with R rail and max assist for strengthening and NMR PT Short Term Goal 3 - Progress (Week 2): Progressing toward goal Week 3:  PT Short Term Goal 1 (Week 3): =LTGs  Skilled Therapeutic Interventions/Progress Updates:    no c/o pain.  Session focus on NMR via bed mobility, transfers, and ambulation.    Pt dons shoes at bed  level with total assist for L shoe/AFO and min assist for R shoe.  Supine>sit with mod assist as pt attempting to come from supine>long sitting without coming through side lying.  Pt ambulates through unit 120' + 40' with RW with L handsplint, requiring mod assist for initial bout of ambulation, fade to min assist on second bout.  Discussed reasons for change in assist level to include fatigue, decreased recall of cues from previous day, etc.  Pt requires max assist to turn to L and sit versus min assist to turn to R and sit when approaching from ambulatory position.  Blocked practice sit<>squat x5 reps focus on forward weight shift, equal weight bearing through LEs, and glute activation, +5 reps focus on same as above with forward gaze.  Pt transfers back to w/c on L with min assist and PT propelled back to room for time management. Call bell in reach and needs met.   Therapy Documentation Precautions:  Precautions Precautions: Fall Precaution Comments: L side weakness Restrictions Weight Bearing Restrictions: No   See Function Navigator for Current Functional Status.  Therapy/Group: Individual Therapy  Earnest Conroy Penven-Crew 03/28/2016, 12:11 PM

## 2016-03-29 DIAGNOSIS — G479 Sleep disorder, unspecified: Secondary | ICD-10-CM

## 2016-03-29 NOTE — Progress Notes (Signed)
Orthopedic Tech Progress Note Patient Details:  Satira Mccallumntonio Bolick [birth year] [id number]  Patient ID: Satira Mccallumntonio [place], male   DOB: [birth year], 51 y.o.   MRN: [id number]   Nikki DomCrawford, Madge Therrien 03/29/2016, 11:46 AM Called in advanced brace order; spoke with answering service

## 2016-03-29 NOTE — Progress Notes (Signed)
[facility] PHYSICAL MEDICINE & REHABILITATION     PROGRESS NOTE    Subjective/Complaints: Pt seen laying in bed this AM.  He slept well overnight.  Family at bedside.   ROS: Denies CP, SOB, N/V/D  Objective: Vital Signs: Blood pressure 112/63, pulse (!) 58, temperature 98.1 F (36.7 C), temperature source Oral, resp. rate 18, height 5\' 6"  (1.676 m), weight 67.9 kg (149 lb 9.6 oz), SpO2 94 %. No results found. No results for input(s): WBC, HGB, HCT, PLT in the last 72 hours.  Recent Labs  03/27/16 2337  CREATININE 0.93   CBG (last 3)  No results for input(s): GLUCAP in the last 72 hours.  Wt Readings from Last 3 Encounters:  03/25/16 67.9 kg (149 lb 9.6 oz)  03/13/16 73.6 kg (162 lb 4.1 oz)    Physical Exam:  Constitutional: He appears well-developed. No distress.  HENT: Normocephalicand atraumatic.  Eyes: EOMI. No discharge.  Cardiovascular: RRR. No JVD. Respiratory:CTA. Unlabored.  GI: Soft. Bowel sounds are normal.  Musculoskeletal: No edema. No tenderness.  Skin: Skin is warmand dry. He is not diaphoretic.  Neurological:  Alert Motor. RUE and RLE motor 5/5.  LUE 2/5 Shoulder adduction remain0/5. mAS 1+/4 elbow flexors, wrist/hand 2/4 LLE 3+ hip /knee extension, ADF/PF 0/5 Mild Left central 7.  Fair insight and awareness.   Assessment/Plan: 1. Functional and mobility deficits secondary to right MCA infarct which require 3+ hours per day of interdisciplinary therapy in a comprehensive inpatient rehab setting. Physiatrist is providing close team supervision and 24 hour management of active medical problems listed below. Physiatrist and rehab team continue to assess barriers to discharge/monitor patient progress toward functional and medical goals.  Function:  Bathing Bathing position   Position: Shower  Bathing parts Body parts bathed by patient: Left arm, Chest, Abdomen, Front perineal area, Right upper leg, Left upper leg, Right lower leg Body parts  bathed by helper: Right arm, Buttocks, Left lower leg, Back  Bathing assist Assist Level:  (Mod assist)      Upper Body Dressing/Undressing Upper body dressing   What is the patient wearing?: Pull over shirt/dress     Pull over shirt/dress - Perfomed by patient: Thread/unthread right sleeve, Put head through opening, Thread/unthread left sleeve, Pull shirt over trunk Pull over shirt/dress - Perfomed by helper: Pull shirt over trunk        Upper body assist Assist Level: Supervision or verbal cues, Set up   Set up : To obtain clothing/put away  Lower Body Dressing/Undressing Lower body dressing   What is the patient wearing?: Underwear, Pants, Shoes, [employer], AFO Underwear - Performed by patient: Thread/unthread right underwear leg, Pull underwear up/down Underwear - Performed by helper: Thread/unthread left underwear leg Pants- Performed by patient: Thread/unthread right pants leg, Thread/unthread left pants leg, Pull pants up/down Pants- Performed by helper: Thread/unthread left pants leg   Non-skid slipper socks- Performed by helper: Don/doff right sock, Don/doff left sock   Socks - Performed by helper: Don/doff right sock, Don/doff left sock Shoes - Performed by patient: Don/doff right shoe Shoes - Performed by helper: Don/doff left shoe   AFO - Performed by helper: Don/doff left AFO   TED Hose - Performed by helper: Don/doff left TED hose, Don/doff right TED hose  Lower body assist Assist for lower body dressing:  (Mod assist)      Toileting Toileting Toileting activity did not occur: Refused (no bm) Toileting steps completed by patient: Performs perineal hygiene, Adjust clothing prior to  toileting, Adjust clothing after toileting Toileting steps completed by helper: Adjust clothing prior to toileting, Performs perineal hygiene, Adjust clothing after toileting Toileting Assistive Devices: Grab bar or rail  Toileting assist Assist level: Touching or steadying assistance  (Pt.75%)   Transfers Chair/bed transfer   Chair/bed transfer method: Stand pivot Chair/bed transfer assist level: Maximal assist (Pt 25 - 49%/lift and lower) Chair/bed transfer assistive device: Armrests, Patent attorneyWalker     Locomotion Ambulation     Max distance: 20 Assist level: Moderate assist (Pt 50 - 74%)   Wheelchair   Type: Manual Max wheelchair distance: 220 ft Assist Level: Supervision or verbal cues  Cognition Comprehension Comprehension assist level: Understands complex 90% of the time/cues 10% of the time  Expression Expression assist level: Expresses basic 90% of the time/requires cueing < 10% of the time.  Social Interaction Social Interaction assist level: Interacts appropriately 75 - 89% of the time - Needs redirection for appropriate language or to initiate interaction.  Problem Solving Problem solving assist level: Solves basic 90% of the time/requires cueing < 10% of the time  Memory Memory assist level: Recognizes or recalls 75 - 89% of the time/requires cueing 10 - 24% of the time   Medical Problem List and Plan: 1. 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 DVT Prophylaxis/Anticoagulation: Subcutaneous Lovenox. Monitor platelet counts and any signs of bleeding 3. Pain Management: Tylenol as needed 4. Mood: Provide emotional support, neuropsych following 5. Neuropsych: This patient iscapable of making decisions on hisown behalf. 6. Skin/Wound Care: Routine skin checks 7. Fluids/Electrolytes/Nutrition: Routine I&Os  Labs ordered for tomorrow 8.Dysphagia. Advanced to regular diet 9.Hyperlipidemia. Lipitor 10.Tobacco abuse. Counseling,  NicoDerm patch 11. 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  Sleep disturbance  Improved with trazodone 50mg , cont current dose 13.  Adjustment disorder and anxiety , tolerating celexa   LOS  (Days) 15 A FACE TO FACE EVALUATION WAS PERFORMED  Jennipher Weatherholtz Karis JubaAnil Amberly Livas, MD 03/29/2016 7:17 AM

## 2016-03-30 ENCOUNTER — Inpatient Hospital Stay (HOSPITAL_COMMUNITY): Payer: Self-pay | Admitting: Physical Therapy

## 2016-03-30 ENCOUNTER — Inpatient Hospital Stay (HOSPITAL_COMMUNITY): Payer: Self-pay | Admitting: Occupational Therapy

## 2016-03-30 ENCOUNTER — Inpatient Hospital Stay (HOSPITAL_COMMUNITY): Payer: Self-pay | Admitting: Speech Pathology

## 2016-03-30 LAB — CBC WITH DIFFERENTIAL/PLATELET
BASOS ABS: 0.1 10*3/uL (ref 0.0–0.1)
BASOS PCT: 1 %
EOS PCT: 3 %
Eosinophils Absolute: 0.3 10*3/uL (ref 0.0–0.7)
HCT: 39.7 % (ref 39.0–52.0)
Hemoglobin: 13.1 g/dL (ref 13.0–17.0)
Lymphocytes Relative: 22 %
Lymphs Abs: 2.3 10*3/uL (ref 0.7–4.0)
MCH: 27.8 pg (ref 26.0–34.0)
MCHC: 33 g/dL (ref 30.0–36.0)
MCV: 84.1 fL (ref 78.0–100.0)
MONO ABS: 0.9 10*3/uL (ref 0.1–1.0)
Monocytes Relative: 8 %
NEUTROS ABS: 7.2 10*3/uL (ref 1.7–7.7)
Neutrophils Relative %: 66 %
Platelets: 224 10*3/uL (ref 150–400)
RBC: 4.72 MIL/uL (ref 4.22–5.81)
RDW: 12.8 % (ref 11.5–15.5)
WBC: 10.8 10*3/uL — ABNORMAL HIGH (ref 4.0–10.5)

## 2016-03-30 LAB — BASIC METABOLIC PANEL
ANION GAP: 5 (ref 5–15)
BUN: 13 mg/dL (ref 6–20)
CALCIUM: 9.2 mg/dL (ref 8.9–10.3)
CO2: 26 mmol/L (ref 22–32)
Chloride: 106 mmol/L (ref 101–111)
Creatinine, Ser: 0.72 mg/dL (ref 0.61–1.24)
GFR calc Af Amer: >60 mL/min (ref >60)
GLUCOSE: 112 mg/dL — AB (ref 65–99)
Potassium: 3.7 mmol/L (ref 3.5–5.1)
SODIUM: 137 mmol/L (ref 135–145)

## 2016-03-30 MED ORDER — TIZANIDINE HCL 4 MG PO TABS
4.0000 mg | ORAL_TABLET | Freq: Every day | ORAL | Status: DC
Start: 2016-03-30 — End: 2016-04-04
  Administered 2016-03-30 – 2016-04-03 (×5): 4 mg via ORAL
  Filled 2016-03-30 (×5): qty 1

## 2016-03-30 NOTE — Progress Notes (Signed)
Occupational Therapy Weekly Progress Note  Patient Details  Name: Grant Garcia MRN: [id number] Date of Birth: [birth year]  Beginning of progress report period: March 23, 2016 End of progress report period: March 30, 2016  Today's Date: 03/30/2016 OT Individual Time: 0762-2633 OT Individual Time Calculation (min): 57 min    Patient has met 3 of 5 short term goals.  Pt is making steady progress towards goals.  Pt currently requires min assist with transfers to Rt and min-mod assist transferring to Lt due to impulsivity and decreased attention to Lt.  Overall min- mod assist with bathing and dressing tasks, due to decreased trunk control in sitting and standing as well as Lt hemiplegia.  Education regarding hemi-technique has begun as well as NMR to facilitate LUE motor recovery.  Pt's wife present during 63 of sessions and has been checked off to complete w/c <> bed transfers.  Patient continues to demonstrate the following deficits: muscle weakness, abnormal tone and unbalanced muscle activation, decreased attention to left, decreased awareness, decreased problem solving, decreased safety awareness and decreased memory and decreased sitting balance, decreased standing balance, decreased postural control and hemiplegia and therefore will continue to benefit from skilled OT intervention to enhance overall performance with BADL and Reduce care partner burden.  Patient not progressing toward long term goals.  See goal revision..  Plan of care revisions: downgraded bathing and toileting to min assist to match dynamic standing goal of min assist.  OT Short Term Goals Week 2:  OT Short Term Goal 1 (Week 2): Pt will complete toilet transfer with min assist OT Short Term Goal 1 - Progress (Week 2): Met OT Short Term Goal 2 (Week 2): Pt will complete LB dressing with min assist at sit > stand level OT Short Term Goal 2 - Progress (Week 2): Progressing toward goal OT Short Term Goal 3 (Week 2):  Pt will complete UB dressing with supervision OT Short Term Goal 3 - Progress (Week 2): Met OT Short Term Goal 4 (Week 2): Pt will complete 1 grooming task in standing with min assist to increase standing balance OT Short Term Goal 4 - Progress (Week 2): Progressing toward goal OT Short Term Goal 5 (Week 2): Pt will utilize LUE as stabilizer with min cues/min assist OT Short Term Goal 5 - Progress (Week 2): Met Week 3:  OT Short Term Goal 1 (Week 3): Pt will complete LB dressing with min assist at sit > stand level OT Short Term Goal 2 (Week 3): Pt will complete 1 grooming task in standing with min assist to increase standing balance  Skilled Therapeutic Interventions/Progress Updates:    Treatment session with focus on functional transfers, standing balance, and awareness of LUE during mobility and self-care tasks.  Pt received on toilet with pt's 2 brothers present, noted LUE to be dangling in sitting.  Brothers attempting to assist pt with any movement, educated on purpose of therapy and pt's goals to decrease burden of care.  Completed stand pivot transfer toilet > w/c > shower chair with min-mod assist.  Educated pt on increased awareness and positioning of LUE to decrease risk of subluxation and pain.  Completed bathing with min-mod assist with pt requiring assist to maintain standing balance as well as assist to cross LLE over Rt knee to allow pt to wash lower leg.  Pt with improved sequencing with LB dressing this session, still requiring assist to thread Lt pant leg with underwear and to don Lt shoe with AFO.  Reiterated  safety plan and for pt to call nursing staff for bathroom transfers and only wife to transfer w/c <> bed with pt and his brothers reporting understanding.  Therapy Documentation Precautions:  Precautions Precautions: Fall Precaution Comments: L side weakness Restrictions Weight Bearing Restrictions: No General:   Vital Signs: Therapy Vitals Temp: 99.2 F (37.3  C) Temp Source: Oral Pulse Rate: (!) 53 Resp: 18 BP: 119/61 Patient Position (if appropriate): Lying Oxygen Therapy SpO2: 97 % O2 Device: Not Delivered Pain:  Pt with no c/o pain  See Function Navigator for Current Functional Status.   Therapy/Group: Individual Therapy  Simonne Come 03/30/2016, 7:31 AM

## 2016-03-30 NOTE — Progress Notes (Signed)
Physical Therapy Session Note  Patient Details  Name: Grant Garcia MRN: [id number] Date of Birth: [birth year]  Today's Date: 03/30/2016 PT Individual Time: 1000-1100 PT Individual Time Calculation (min): 60 min   Short Term Goals: Week 3:  PT Short Term Goal 1 (Week 3): =LTGs  Skilled Therapeutic Interventions/Progress Updates:    no c/o pain.  Session focus on NMR via forced use and multimodal cues during transfers, gait training, and curb step negotiation.    Pt propels w/c to and from therapy gym with more than a reasonable amount of time.  Sit<>stand x5 without UE support, focus on equal weight bearing and midline.  Gait training x90' with RW/L hand splint with min assist and mod verbal cues for L foot placement and R weight shift during L swing phase.  PT instructed pt in curb step negotiation x2 with RW with mod assist and mod verbal cues for sequencing.  PT educated pt on initiating family education for ambulation and step negotiation for home entry emphasizing pacing, R weight shift to reduce dependence on care giver for balance, and safety.  Orthotist in to assess pt for GRAFO and toe cap.  Pt returned to room in w/c at end of session, family and interpreter present.   Therapy Documentation Precautions:  Precautions Precautions: Fall Precaution Comments: L side weakness Restrictions Weight Bearing Restrictions: No   See Function Navigator for Current Functional Status.   Therapy/Group: Individual Therapy  Ladora DanielCaitlin E Penven-Crew 03/30/2016, 12:11 PM

## 2016-03-30 NOTE — Progress Notes (Signed)
[facility]IC[facility]ILIT[facility]     PROGRESS NOTE    Subjective/Complaints:   ROS: Denies CP, SOB, N/V/D  Objective: Vital Signs: Blood pressure (!) 111/58, pulse (!) 57, temperature 98.8 F (37.1 C), temperature source Oral, resp. rate 17, height 5\' 6"  (1.676 m), weight 67.9 kg (149 lb 9.6 oz), SpO2 95 %. No results found.  Recent Labs  03/30/16 0328  WBC 10.8*  HGB 13.1  HCT 39.7  PLT 224    Recent Labs  03/27/16 2337 03/30/16 0328  NA  --  137  K  --  3.7  CL  --  106  GLUCOSE  --  112*  BUN  --  13  CREATININE 0.93 0.72  CALCIUM  --  9.2   CBG (last 3)  No results for input(s): GLUCAP in the last 72 hours.  Wt Readings from Last 3 Encounters:  03/25/16 67.9 kg (149 lb 9.6 oz)  03/13/16 73.6 kg (162 lb 4.1 oz)    Physical Exam:  Constitutional: He appears well-developed. No distress.  HENT: Normocephalicand atraumatic.  Eyes: EOMI. No discharge.  Cardiovascular: RRR. No JVD. Respiratory:CTA. Unlabored.  GI: Soft. Bowel sounds are normal.  Musculoskeletal: No edema. No tenderness.  Skin: Skin is warmand dry. He is not diaphoretic.  Neurological:  Alert Motor. RUE and RLE motor 5/5.  LUE 2/5 Shoulder adduction remain0/5. mAS 1+/4 elbow flexors, wrist/hand 2/4 LLE 3+ hip /knee extension, ADF/PF 0/5 Mild Left central 7.  Fair insight and awareness.   Assessment/Plan: 1. Functional and mobility deficits secondary to right MCA infarct which require 3+ hours per day of interdisciplinary therapy in a comprehensive inpatient rehab setting. Physiatrist is providing close team supervision and 24 hour management of active medical problems listed below. Physiatrist and rehab team continue to assess barriers to discharge/monitor patient progress toward functional and medical goals.  Function:  Bathing Bathing position   Position: Shower  Bathing parts Body parts bathed by patient: Left arm, Chest, Abdomen, Front perineal area, Right  upper leg, Left upper leg, Right lower leg Body parts bathed by helper: Right arm, Buttocks, Left lower leg, Back  Bathing assist Assist Level:  (Mod assist)      Upper Body Dressing/Undressing Upper body dressing   What is the patient wearing?: Pull over shirt/dress     Pull over shirt/dress - Perfomed by patient: Thread/unthread right sleeve, Put head through opening, Thread/unthread left sleeve, Pull shirt over trunk Pull over shirt/dress - Perfomed by helper: Pull shirt over trunk        Upper body assist Assist Level: Supervision or verbal cues, Set up   Set up : To obtain clothing/put away  Lower Body Dressing/Undressing Lower body dressing   What is the patient wearing?: Underwear, Pants, Shoes, [employer], AFO Underwear - Performed by patient: Thread/unthread right underwear leg, Pull underwear up/down Underwear - Performed by helper: Thread/unthread left underwear leg Pants- Performed by patient: Thread/unthread right pants leg, Thread/unthread left pants leg, Pull pants up/down Pants- Performed by helper: Thread/unthread left pants leg   Non-skid slipper socks- Performed by helper: Don/doff right sock, Don/doff left sock   Socks - Performed by helper: Don/doff right sock, Don/doff left sock Shoes - Performed by patient: Don/doff right shoe Shoes - Performed by helper: Don/doff left shoe   AFO - Performed by helper: Don/doff left AFO   TED Hose - Performed by helper: Don/doff left TED hose, Don/doff right TED hose  Lower body assist Assist for lower body  dressing:  (Mod assist)      Toileting Toileting Toileting activity did not occur: Refused (no bm) Toileting steps completed by patient: Adjust clothing prior to toileting, Performs perineal hygiene, Adjust clothing after toileting Toileting steps completed by helper: Adjust clothing prior to toileting, Performs perineal hygiene, Adjust clothing after toileting Toileting Assistive Devices: Grab bar or rail  Toileting  assist Assist level: Touching or steadying assistance (Pt.75%)   Transfers Chair/bed transfer   Chair/bed transfer method: Stand pivot Chair/bed transfer assist level: Maximal assist (Pt 25 - 49%/lift and lower) Chair/bed transfer assistive device: Armrests, Patent attorneyWalker     Locomotion Ambulation     Max distance: 20 Assist level: Moderate assist (Pt 50 - 74%)   Wheelchair   Type: Manual Max wheelchair distance: 220 ft Assist Level: Supervision or verbal cues  Cognition Comprehension Comprehension assist level: Understands complex 90% of the time/cues 10% of the time  Expression Expression assist level: Expresses basic 90% of the time/requires cueing < 10% of the time.  Social Interaction Social Interaction assist level: Interacts appropriately 75 - 89% of the time - Needs redirection for appropriate language or to initiate interaction.  Problem Solving Problem solving assist level: Solves basic 90% of the time/requires cueing < 10% of the time  Memory Memory assist level: Recognizes or recalls 75 - 89% of the time/requires cueing 10 - 24% of the time   Medical Problem List and Plan: 1. 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 DVT Prophylaxis/Anticoagulation: Subcutaneous Lovenox. Monitor platelet counts and any signs of bleeding 3. Pain Management: Tylenol as needed 4. Mood: Provide emotional support, neuropsych following 5. Neuropsych: This patient iscapable of making decisions on hisown behalf. 6. Skin/Wound Care: Routine skin checks 7. Fluids/Electrolytes/Nutrition: Routine I&Os  CBC and bmet nl 8.Dysphagia. Advanced to regular diet 9.Hyperlipidemia. Lipitor 10.Tobacco abuse. Counseling,  NicoDerm patch 11. 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  Sleep disturbance   with trazodone 50mg , cont current dose, according to brothers  has shaking of Left leg at night as well, likely spasticity, will increase  zanaflex 4mg  qhs 13.  Adjustment disorder and anxiety , tolerating celexa , Neuropsych to follow  LOS (Days) 16 A FACE TO FACE EVALUATION WAS PERFORMED  Erick ColaceKIRSTEINS,Marvetta Vohs E, MD 03/30/2016 6:18 AM

## 2016-03-30 NOTE — Progress Notes (Signed)
Physical Therapy Session Note  Patient Details  Name: Grant Garcia MRN: [id number] Date of Birth: 1965-11-01  Today's Date: 03/30/2016 PT Individual Time: 1345-1430 PT Individual Time Calculation (min): 45 min   Short Term Goals: Week 3:  PT Short Term Goal 1 (Week 3): =LTGs  Skilled Therapeutic Interventions/Progress Updates:    no c/o pain.  Session focus on transfers, NMR, and strengthening.   Pt propels w/c through unit with R hemi-technique with more than a reasonable amount of time.  PT instructed pt in car transfer with RW and ambulatory approach with min assist and mod verbal cues for safe sequencing with new transfer technique.  Nustep x10 minutes with BLEs only for reciprocal stepping pattern, LLE strengthening, and overall activity tolerance with occasional light use of RUE to initiate LLE extension.  Pt returned to room at end of session and positioned in bed with min assist.  Call bell in reach and needs met.   Therapy Documentation Precautions:  Precautions Precautions: Fall Precaution Comments: L side weakness Restrictions Weight Bearing Restrictions: No See Function Navigator for Current Functional Status.   Therapy/Group: Individual Therapy  Earnest Conroy Penven-Crew 03/30/2016, 4:27 PM

## 2016-03-30 NOTE — Progress Notes (Signed)
Speech Language Pathology Weekly Progress and Session Note  Patient Details  Name: Grant Garcia MRN: [id number] Date of Birth: [birth year]  Beginning of progress report period: March 23, 2016 End of progress report period: March 30, 2016  Today's Date: 03/30/2016 SLP Individual Time: 6294-7654 SLP Individual Time Calculation (min): 45 min  Short Term Goals: Week 2: SLP Short Term Goal 1 (Week 2): Patient will utilize over articulation at the phrase-sentence level of verbal expression with supervision assist verbal and non-verbal cues.  SLP Short Term Goal 1 - Progress (Week 2): Met SLP Short Term Goal 2 (Week 2): Patient will solve problem related to self-care tasks with Min assist multimodal cues for self-monitoring and correcting. SLP Short Term Goal 2 - Progress (Week 2): Met SLP Short Term Goal 3 (Week 2): Patient will attend to left upper extremity and to left of midline with supervision assist multimodal cues during functional tasks. SLP Short Term Goal 3 - Progress (Week 2): Met SLP Short Term Goal 4 (Week 2): Patient will recall new, daily information with Min A verbal cues.  SLP Short Term Goal 4 - Progress (Week 2): Met SLP Short Term Goal 5 (Week 2): Patient will demonstrate effective masticaiton and oral clearance of regular textures with Min verbal cues over two sessions prior to upgrade.  SLP Short Term Goal 5 - Progress (Week 2): Met SLP Short Term Goal 6 (Week 2): Patient will self-monitor and correct left anterior spillage of saliva with Min A verbal cues.     New Short Term Goals: Week 3: SLP Short Term Goal 1 (Week 3): Patient will solve problem related to self-care tasks with supervision assist multimodal cues for self-monitoring and correcting. SLP Short Term Goal 2 (Week 3): Patient will attend to left upper extremity and to left of midline with Mod I during functional tasks. SLP Short Term Goal 3 (Week 3): Patient will recall new, daily information with  supervision verbal cues.   Weekly Progress Updates: Patient continues to make excellent gains and has met 5 of 5 STG's this reporting period. Currently, patient is consuming regular textures with thin liquids without overt s/s of aspiration and is Mod I for use of swallowing compensatory strategies. Patient also requires overall Min A to complete functional and familiar tasks safely in regards to problem solving and recall and supervision verbal cues for attention to left field of environment/LUE. Patient is Mod I for use of speech intelligibility strategies to achieve 100% intelligibility at the sentence level. Patient and family education is ongoing. Patient would benefit from continued skilled SLP intervention to maximize his cognitive function and overall functional independence prior to discharge.      Intensity: Minumum of 1-2 x/day, 30 to 90 minutes Frequency: 3 to 5 out of 7 days Duration/Length of Stay: 3/10 Treatment/Interventions: Cognitive remediation/compensation;Cueing hierarchy;Environmental controls;Functional tasks;Internal/external aids;Patient/family education;Therapeutic Activities   Daily Session  Skilled Therapeutic Interventions: Skilled treatment session focused on cognitive goals. SLP facilitated session by providing Mod A verbal cues for problem solving and sequencing with transfer from wheelchair to bed. SLP also facilitated session by providing Min A verbal cues for problem solving in regards to procedures and recall of information during a novel, complex task. Patient left upright in wheelchair with all needs within reach. Continue with current plan of care.       Function:   Cognition Comprehension Comprehension assist level: Understands complex 90% of the time/cues 10% of the time  Expression   Expression assist level: Expresses basic 90%  of the time/requires cueing < 10% of the time.  Social Interaction Social Interaction assist level: Interacts appropriately 75  - 89% of the time - Needs redirection for appropriate language or to initiate interaction.  Problem Solving Problem solving assist level: Solves basic 75 - 89% of the time/requires cueing 10 - 24% of the time  Memory Memory assist level: Recognizes or recalls 75 - 89% of the time/requires cueing 10 - 24% of the time   Pain No/Denies Pain   Therapy/Group: Individual Therapy  Keilee Denman 03/30/2016, 3:41 PM

## 2016-03-30 NOTE — Plan of Care (Signed)
Problem: RH Bathing Goal: LTG Patient will bathe with assist, cues/equipment (OT) LTG: Patient will bathe specified number of body parts with assist with/without cues using equipment (position)  (OT)  Downgraded as pt will require min assist for standing balance  Problem: RH Toileting Goal: LTG Patient will perform toileting w/assist, cues/equip (OT) LTG: Patient will perform toiletiing (clothes management/hygiene) with assist, with/without cues using equipment (OT)  Downgraded as pt will require min assist for standing balance

## 2016-03-31 ENCOUNTER — Inpatient Hospital Stay (HOSPITAL_COMMUNITY): Payer: Self-pay | Admitting: Occupational Therapy

## 2016-03-31 ENCOUNTER — Inpatient Hospital Stay (HOSPITAL_COMMUNITY): Payer: Self-pay | Admitting: Physical Therapy

## 2016-03-31 ENCOUNTER — Inpatient Hospital Stay (HOSPITAL_COMMUNITY): Payer: Self-pay | Admitting: Speech Pathology

## 2016-03-31 NOTE — Progress Notes (Signed)
Physical Therapy Session Note  Patient Details  Name: Grant Garcia MRN: [id number] Date of Birth: November 19, 1965  Today's Date: 03/31/2016 PT Individual Time: 1000-1100 PT Individual Time Calculation (min): 60 min   Short Term Goals: Week 3:  PT Short Term Goal 1 (Week 3): =LTGs  Skilled Therapeutic Interventions/Progress Updates:    no c/o pain.  Session focus on family education for gait training in household and controlled environment as well as for car transfers.  Pt propels w/c throughout unit with R hemi technique mod I.  PT instructs pt's wife, Dedra Skeens, in gait training in simulated home environment 2x10' with RW with mod cues for hand placement, proximity to pt, and providing steadying versus corrective assist with LOB.  PT instructed pt's and [name] in car transfer with supervision and mod cues for pt and care giver sequencing.  Pt with 2 LOB when ambulating with [name] requiring assist from PT to correct, expect pt and [name] to improve with their confidence and safety with continued practice.  Pt returned to room at end of session and positioned in w/c with call bell in reach and needs met.   Therapy Documentation Precautions:  Precautions Precautions: Fall Precaution Comments: L side weakness Restrictions Weight Bearing Restrictions: No   See Function Navigator for Current Functional Status.   Therapy/Group: Individual Therapy  Earnest Conroy Penven-Crew 03/31/2016, 12:11 PM

## 2016-03-31 NOTE — Plan of Care (Signed)
Problem: RH Toilet Transfers Goal: LTG Patient will perform toilet transfers w/assist (OT) LTG: Patient will perform toilet transfers with assist, with/without cues using equipment (OT)  Downgraded as pt will most likely have to access toilet with RW

## 2016-03-31 NOTE — Progress Notes (Signed)
Speech Language Pathology Daily Session Note  Patient Details  Name: Grant Garcia MRN: [id number] Date of Birth: Dec 09, 1965  Today's Date: 03/31/2016 SLP Individual Time: [id number] SLP Individual Time Calculation (min): 44 min  Short Term Goals: Week 3: SLP Short Term Goal 1 (Week 3): Patient will solve problem related to self-care tasks with supervision assist multimodal cues for self-monitoring and correcting. SLP Short Term Goal 2 (Week 3): Patient will attend to left upper extremity and to left of midline with Mod I during functional tasks. SLP Short Term Goal 3 (Week 3): Patient will recall new, daily information with supervision verbal cues.   Skilled Therapeutic Interventions: Skilled treatment session focused on cognitive goals. SLP facilitated session by administering the MoCA (version 7.2). Patient scored 25/30 points with a score of 26 or above considered normal. Patient demonstrated impairments in the areas of executive functioning and orientation to date but also suspect function was impacted by language barrier. Patient handed off to OT. Continue with current plan of care.      Function:   Cognition Comprehension Comprehension assist level: Understands complex 90% of the time/cues 10% of the time  Expression   Expression assist level: Expresses basic 90% of the time/requires cueing < 10% of the time.  Social Interaction Social Interaction assist level: Interacts appropriately 75 - 89% of the time - Needs redirection for appropriate language or to initiate interaction.  Problem Solving Problem solving assist level: Solves basic 90% of the time/requires cueing < 10% of the time  Memory Memory assist level: Recognizes or recalls 75 - 89% of the time/requires cueing 10 - 24% of the time    Pain No/Denies Pain   Therapy/Group: Individual Therapy  Luster Hechler 03/31/2016, 3:18 PM

## 2016-03-31 NOTE — Progress Notes (Signed)
Occupational Therapy Session Note  Patient Details  Name: Grant Garcia Lichtenwalner MRN: [id number] Date of Birth: 09-27-1965  Today's Date: 03/31/2016 OT Individual Time: [id number] and 1347-1420 OT Individual Time Calculation (min): 60 min and 33 min   Short Term Goals: Week 3:  OT Short Term Goal 1 (Week 3): Pt will complete LB dressing with min assist at sit > stand level OT Short Term Goal 2 (Week 3): Pt will complete 1 grooming task in standing with min assist to increase standing balance  Skilled Therapeutic Interventions/Progress Updates:    1) Treatment session with focus on functional transfers in ADL apt.  Pt's brothers present, providing home setup information, and observing during bathroom transfers in ADL apt.  Completed tub/shower transfer in ADL apt bathroom with tub bench and RW with min assist.  Pt required min assist to ambulate into bathroom, tactile cues for weight shift to bring back LLE when approaching tub bench and assist to lift LLE over tub ledge.  Pt also has walk-in shower in master bathroom.  Completed simulated transfer in/out of simulated walk-in shower with 3" ledge requiring mod-max assist due to tight quarters and to lift LLE over ledge into simulated shower.  Discussed increased difficulty and decreased safety with walk-in shower transfer due to small bathroom at home with limited space for wife to assist and increased assist required to manage trunk in standing as well as LLE over ledge.  Pt with decreased safety awareness, reporting that he desires to utilize walk-in shower despite therapist recommendations.  Discussed recommendation for wife to take pictures of both bathrooms and to attend PM session, pt's brother called wife at end of session.  2) Treatment session with focus on education with wife regarding safety with bathroom transfers.  Completed tub/shower transfers in ADL apt bathroom with use of tub bench and RW.  Pt required min assist with mobility with  ambulation to shower and lifting LLE over tub ledge as well as min cues for sequencing of transfer.  Discussed lateral leans for washing buttocks as well as use of installed grab bar for sit > stand.  Pt's wife observing and providing min cues during session.  Demonstrated to wife concerns with walk-in shower, with increased fall risk and increased assist required from caregiver to access walk-in shower.  Plan to engage in bathing in ADL apt bathroom to further simulate home setup, encouraged wife to attend bathing session tomorrow.  Therapy Documentation Precautions:  Precautions Precautions: Fall Precaution Comments: L side weakness Restrictions Weight Bearing Restrictions: No General:   Vital Signs: Therapy Vitals Temp: 99 F (37.2 C) Temp Source: Oral Pulse Rate: 68 Resp: 17 BP: 120/62 Patient Position (if appropriate): Lying Oxygen Therapy SpO2: 96 % O2 Device: Not Delivered Pain:  Pt with no c/o pain  See Function Navigator for Current Functional Status.   Therapy/Group: Individual Therapy  [name], Mckala Pantaleon 03/31/2016, 3:39 PM

## 2016-03-31 NOTE — Progress Notes (Addendum)
[place] PHYSICAL MEDICINE & REHABILITATION     PROGRESS NOTE    Subjective/Complaints: No interpreter  At bedside, 2 brothers at bedside, while there. Brothers indicate that they will be helping patient at home, limited English  ROS: Denies CP, SOB, N/V/D  Objective: Vital Signs: Blood pressure 124/60, pulse 60, temperature 98.6 F (37 C), temperature source Oral, resp. rate 16, height 5\' 6"  (1.676 m), weight 67.9 kg (149 lb 9.6 oz), SpO2 98 %. No results found.  Recent Labs  03/30/16 0328  WBC 10.8*  HGB 13.1  HCT 39.7  PLT 224    Recent Labs  03/30/16 0328  NA 137  K 3.7  CL 106  GLUCOSE 112*  BUN 13  CREATININE 0.72  CALCIUM 9.2   CBG (last 3)  No results for input(s): GLUCAP in the last 72 hours.  Wt Readings from Last 3 Encounters:  03/25/16 67.9 kg (149 lb 9.6 oz)  03/13/16 73.6 kg (162 lb 4.1 oz)    Physical Exam:  Constitutional: He appears well-developed. No distress.  HENT: Normocephalicand atraumatic.  Eyes: EOMI. No discharge.  Cardiovascular: RRR. No JVD. Respiratory:CTA. Unlabored.  GI: Soft. Bowel sounds are normal.  Musculoskeletal: No edema. No tenderness.  Skin: Skin is warmand dry. He is not diaphoretic.  Neurological:  Alert Motor. RUE and RLE motor 5/5.  LUE 2/5 Shoulder adduction remain0/5. mAS 1+/4 elbow flexors, wrist/hand 2/4 LLE 3+ hip /knee extension, ADF/PF 0/5 Mild Left central 7.  Fair insight and awareness.   Assessment/Plan: 1. Functional and mobility deficits secondary to right MCA infarct which require 3+ hours per day of interdisciplinary therapy in a comprehensive inpatient rehab setting. Physiatrist is providing close team supervision and 24 hour management of active medical problems listed below. Physiatrist and rehab team continue to assess barriers to discharge/monitor patient progress toward functional and medical goals.  Function:  Bathing Bathing position   Position: Shower  Bathing parts Body  parts bathed by patient: Left arm, Chest, Abdomen, Front perineal area, Right upper leg, Left upper leg, Right lower leg, Left lower leg Body parts bathed by helper: Right arm, Buttocks, Back  Bathing assist Assist Level: Touching or steadying assistance(Pt > 75%) (Min assist)      Upper Body Dressing/Undressing Upper body dressing   What is the patient wearing?: Pull over shirt/dress     Pull over shirt/dress - Perfomed by patient: Thread/unthread right sleeve, Put head through opening, Thread/unthread left sleeve, Pull shirt over trunk Pull over shirt/dress - Perfomed by helper: Pull shirt over trunk        Upper body assist Assist Level: Supervision or verbal cues, Set up   Set up : To obtain clothing/put away  Lower Body Dressing/Undressing Lower body dressing   What is the patient wearing?: Underwear, Pants, Shoes, [employer]ed Hose, AFO Underwear - Performed by patient: Thread/unthread right underwear leg, Pull underwear up/down Underwear - Performed by helper: Thread/unthread left underwear leg Pants- Performed by patient: Thread/unthread right pants leg, Thread/unthread left pants leg, Pull pants up/down Pants- Performed by helper: Thread/unthread left pants leg   Non-skid slipper socks- Performed by helper: Don/doff right sock, Don/doff left sock   Socks - Performed by helper: Don/doff right sock, Don/doff left sock Shoes - Performed by patient: Don/doff right shoe Shoes - Performed by helper: Don/doff left shoe   AFO - Performed by helper: Don/doff left AFO   TED Hose - Performed by helper: Don/doff left TED hose, Don/doff right TED hose  Lower body assist  Assist for lower body dressing:  (Mod assist)      Toileting Toileting Toileting activity did not occur: Refused (no bm) Toileting steps completed by patient: Adjust clothing prior to toileting, Performs perineal hygiene, Adjust clothing after toileting Toileting steps completed by helper: Adjust clothing prior to  toileting, Performs perineal hygiene, Adjust clothing after toileting Toileting Assistive Devices: Grab bar or rail  Toileting assist Assist level: Touching or steadying assistance (Pt.75%)   Transfers Chair/bed transfer   Chair/bed transfer method: Stand pivot Chair/bed transfer assist level: Maximal assist (Pt 25 - 49%/lift and lower) Chair/bed transfer assistive device: Armrests, Patent attorney     Max distance: 20 Assist level: Moderate assist (Pt 50 - 74%)   Wheelchair   Type: Manual Max wheelchair distance: 220 ft Assist Level: Supervision or verbal cues  Cognition Comprehension Comprehension assist level: Understands complex 90% of the time/cues 10% of the time  Expression Expression assist level: Expresses basic 90% of the time/requires cueing < 10% of the time.  Social Interaction Social Interaction assist level: Interacts appropriately 75 - 89% of the time - Needs redirection for appropriate language or to initiate interaction.  Problem Solving Problem solving assist level: Solves basic 75 - 89% of the time/requires cueing 10 - 24% of the time  Memory Memory assist level: Recognizes or recalls 75 - 89% of the time/requires cueing 10 - 24% of the time   Medical Problem List and Plan: 1. 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 DVT Prophylaxis/Anticoagulation: Subcutaneous Lovenox. Monitor platelet counts and any signs of bleeding 3. Pain Management: Tylenol as needed 4. Mood: Provide emotional support, neuropsych following 5. Neuropsych: This patient iscapable of making decisions on hisown behalf. 6. Skin/Wound Care: Routine skin checks 7. Fluids/Electrolytes/Nutrition: Routine I&Os  CBC and bmet nl 8.Dysphagia. Advanced to regular diet 9.Hyperlipidemia. Lipitor 10.Tobacco abuse.  Counseling,  NicoDerm patch 11. 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  Sleep disturbance   with trazodone 50mg , cont current dose, spasticity,3/5 increased  zanaflex 4mg  qhs 13.  Adjustment disorder and anxiety , tolerating celexa , Neuropsych to follow,   LOS (Days) 17 A FACE TO FACE EVALUATION WAS PERFORMED  Erick Colace, MD 03/31/2016 6:17 AM

## 2016-04-01 ENCOUNTER — Inpatient Hospital Stay (HOSPITAL_COMMUNITY): Payer: Self-pay | Admitting: Speech Pathology

## 2016-04-01 ENCOUNTER — Inpatient Hospital Stay (HOSPITAL_COMMUNITY): Payer: Self-pay | Admitting: Occupational Therapy

## 2016-04-01 ENCOUNTER — Inpatient Hospital Stay (HOSPITAL_COMMUNITY): Payer: Self-pay | Admitting: Physical Therapy

## 2016-04-01 NOTE — Progress Notes (Signed)
Physical Therapy Session Note  Patient Details  Name: Lipa Knauff MRN: [id number] Date of Birth: 1965/05/18  Today's Date: 04/01/2016 PT Individual Time: 1000-1100 PT Individual Time Calculation (min): 60 min   Short Term Goals: Week 3:  PT Short Term Goal 1 (Week 3): =LTGs  Skilled Therapeutic Interventions/Progress Updates:    no c/o pain.  Session focus on family education for short distance gait and step negotiation to simulate home entry, as well as NMR for LLE.    Pt propels w/c to therapy gym using BLEs for reciprocal stepping pattern retraining with max/total cues for attention to LLE.  PT demonstrated curb step negotiation with pt to pt's wife, Dedra Skeens, x2.  PT providing min assist to min guard overall for step negotiation.  Following seated rest break, [name], provided assist for pt to negotiate curb step x2, pt with LOB when ascending step on both opportunities requiring PT to step in and provide assist to prevent fall.  Educated [name] on need for close proximity to pt and provided hand over hand assist for her hand placement on pt, some carry over noted but needs continued practice.  Gait training through cones with RW with min asisst focus on pacing, and balance when turning.  Pt returned to room at end of session and positioned with call bell in reach and needs met.   Therapy Documentation Precautions:  Precautions Precautions: Fall Precaution Comments: L side weakness Restrictions Weight Bearing Restrictions: No   See Function Navigator for Current Functional Status.   Therapy/Group: Individual Therapy  Tajai Ihde E Penven-Crew 04/01/2016, 11:29 AM

## 2016-04-01 NOTE — Progress Notes (Signed)
[place] PHYSICAL MEDICINE & REHABILITATION     PROGRESS NOTE    Subjective/Complaints: Brothers at bedside, who indicate that pt wore the resting hand splint last noc  ROS: Denies CP, SOB, N/V/D  Objective: Vital Signs: Blood pressure 134/67, pulse (!) 52, temperature 98.7 F (37.1 C), temperature source Oral, resp. rate 18, height 5' 6"  (1.676 m), weight 67.9 kg (149 lb 9.6 oz), SpO2 96 %. No results found.  Recent Labs  03/30/16 0328  WBC 10.8*  HGB 13.1  HCT 39.7  PLT 224    Recent Labs  03/30/16 0328  NA 137  K 3.7  CL 106  GLUCOSE 112*  BUN 13  CREATININE 0.72  CALCIUM 9.2   CBG (last 3)  No results for input(s): GLUCAP in the last 72 hours.  Wt Readings from Last 3 Encounters:  03/25/16 67.9 kg (149 lb 9.6 oz)  03/13/16 73.6 kg (162 lb 4.1 oz)    Physical Exam:  Constitutional: He appears well-developed. No distress.  HENT: Normocephalicand atraumatic.  Eyes: EOMI. No discharge.  Cardiovascular: RRR. No JVD. Respiratory:CTA. Unlabored.  GI: Soft. Bowel sounds are normal.  Musculoskeletal: No edema. No tenderness.  Skin: Skin is warmand dry. He is not diaphoretic.  Neurological:  Alert Motor. RUE and RLE motor 5/5.  LUE 2/5 Shoulder adduction remain0/5. mAS 1+/4 elbow flexors, wrist/hand 2/4 LLE 3+ hip /knee extension, ADF/PF 0/5 Mild Left central 7.  Fair insight and awareness.   Assessment/Plan: 1. Functional and mobility deficits secondary to right MCA infarct which require 3+ hours per day of interdisciplinary therapy in a comprehensive inpatient rehab setting. Physiatrist is providing close team supervision and 24 hour management of active medical problems listed below. Physiatrist and rehab team continue to assess barriers to discharge/monitor patient progress toward functional and medical goals.  Function:  Bathing Bathing position   Position: Shower  Bathing parts Body parts bathed by patient: Left arm, Chest, Abdomen, Front  perineal area, Right upper leg, Left upper leg, Right lower leg, Left lower leg Body parts bathed by helper: Right arm, Buttocks, Back  Bathing assist Assist Level: Touching or steadying assistance(Pt > 75%) (Min assist)      Upper Body Dressing/Undressing Upper body dressing   What is the patient wearing?: Pull over shirt/dress     Pull over shirt/dress - Perfomed by patient: Thread/unthread right sleeve, Put head through opening, Thread/unthread left sleeve, Pull shirt over trunk Pull over shirt/dress - Perfomed by helper: Pull shirt over trunk        Upper body assist Assist Level: Supervision or verbal cues, Set up   Set up : To obtain clothing/put away  Lower Body Dressing/Undressing Lower body dressing   What is the patient wearing?: Underwear, Pants, Shoes, [employer], AFO Underwear - Performed by patient: Thread/unthread right underwear leg, Pull underwear up/down Underwear - Performed by helper: Thread/unthread left underwear leg Pants- Performed by patient: Thread/unthread right pants leg, Thread/unthread left pants leg, Pull pants up/down Pants- Performed by helper: Thread/unthread left pants leg   Non-skid slipper socks- Performed by helper: Don/doff right sock, Don/doff left sock   Socks - Performed by helper: Don/doff right sock, Don/doff left sock Shoes - Performed by patient: Don/doff right shoe Shoes - Performed by helper: Don/doff left shoe   AFO - Performed by helper: Don/doff left AFO   TED Hose - Performed by helper: Don/doff left TED hose, Don/doff right TED hose  Lower body assist Assist for lower body dressing:  (Mod assist)  Toileting Toileting Toileting activity did not occur: Refused (no bm) Toileting steps completed by patient: Adjust clothing prior to toileting, Performs perineal hygiene, Adjust clothing after toileting Toileting steps completed by helper: Adjust clothing prior to toileting, Performs perineal hygiene, Adjust clothing after  toileting Toileting Assistive Devices: Grab bar or rail  Toileting assist Assist level: Touching or steadying assistance (Pt.75%)   Transfers Chair/bed transfer   Chair/bed transfer method: Stand pivot Chair/bed transfer assist level: Maximal assist (Pt 25 - 49%/lift and lower) Chair/bed transfer assistive device: Armrests, Medical sales representative     Max distance: 20 Assist level: Moderate assist (Pt 50 - 74%)   Wheelchair   Type: Manual Max wheelchair distance: 220 ft Assist Level: Supervision or verbal cues  Cognition Comprehension Comprehension assist level: Understands complex 90% of the time/cues 10% of the time  Expression Expression assist level: Expresses basic 90% of the time/requires cueing < 10% of the time.  Social Interaction Social Interaction assist level: Interacts appropriately 75 - 89% of the time - Needs redirection for appropriate language or to initiate interaction.  Problem Solving Problem solving assist level: Solves basic 90% of the time/requires cueing < 10% of the time  Memory Memory assist level: Recognizes or recalls 75 - 89% of the time/requires cueing 10 - 24% of the time   Medical Problem List and Plan: 1. 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 Formulized individual treatment plan based on medical history, underlying problem and comorbidities.  -WHO/PRAFO ordered 2. DVT Prophylaxis/Anticoagulation: Subcutaneous Lovenox. Monitor platelet counts and any signs of bleeding 3. Pain Management: Tylenol as needed 4. Mood: Provide emotional support, neuropsych following 5. Neuropsych: This patient iscapable of making decisions on hisown behalf. 6. Skin/Wound Care:  Routine skin checks 7. Fluids/Electrolytes/Nutrition: Routine I&Os  CBC and bmet nl 8.Dysphagia. Advanced to regular diet 9.Hyperlipidemia. Lipitor 10.Tobacco abuse. Counseling,  NicoDerm patch 11. 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  Sleep disturbance   with trazodone 72m, cont current dose, 13.  Adjustment disorder and anxiety , tolerating celexa , Neuropsych to follow,  13. spasticity,3/5 increased  zanaflex 442mqhs, no daytime dose unless interfering with therapy 14.  Bradycardia noted earlier this am, no symptoms, no beta blockers on board will monitor LOS (Days) 18 A FACE TO FACE EVALUATION WAS PERFORMED  KICharlett BlakeMD 04/01/2016 7:13 AM

## 2016-04-01 NOTE — Progress Notes (Signed)
Occupational Therapy Session Note  Patient Details  Name: Grant Garcia MRN: [id number] Date of Birth: [birth year]  Today's Date: 04/01/2016 OT Individual Time: [id number] OT Individual Time Calculation (min): 60 min    Short Term Goals: Week 3:  OT Short Term Goal 1 (Week 3): Pt will complete LB dressing with min assist at sit > stand level OT Short Term Goal 2 (Week 3): Pt will complete 1 grooming task in standing with min assist to increase standing balance  Skilled Therapeutic Interventions/Progress Updates:    Treatment session with focus on ADL retraining in ADL apt bathroom.  Pt's wife not present at beginning of session to engage in family education with transfers and how to provide cues/assist during bathing, however arrived towards end of session.  Ambulated into ADL apt bathroom with RW and min-mod assist, requiring mod assist when turning and backing up to approach tub bench.  Completed transfer with assist to advance LLE over tub ledge.  Pt able to cross LLE over Rt knee this session for bathing, with pt maintaining Lt heel on tub ledge to wash foot.  Completed dressing in shower with pt able to cross leg over knee as above and thread shorts and underwear.  Pt's wife arrived during dressing, able to observe dressing technique and cues to increase pt success.  Pt able to pull underwear over hips in standing with min assist from therapist to maintain standing balance, pt unable to pull pants over hips this session therefore stood with min guard while therapist pulled pants over hips.  Pt's wife assisted with transfer back to w/c in ADL apt with min assist and mod cues for proximity to pt and hand placement to increase assist if pt were to lose his balance.  Completed tub/shower transfers again with wife providing assistance, however pt with LOB when turning to sit on tub bench requiring total assist of 2 to correct.  Again reiterated recommendation for close proximity and hands on during  ambulation.  Therapy Documentation Precautions:  Precautions Precautions: Fall Precaution Comments: L side weakness Restrictions Weight Bearing Restrictions: No Pain:  Pt with no c/o pain  See Function Navigator for Current Functional Status.   Therapy/Group: Individual Therapy  [name], Jajuan Skoog 04/01/2016, 9:47 AM

## 2016-04-01 NOTE — Progress Notes (Signed)
Speech Language Pathology Daily Session Note  Patient Details  Name: Grant Garcia MRN: [id number] Date of Birth: [birth year]  Today's Date: 04/01/2016 SLP Individual Time: 1350-1420 SLP Individual Time Calculation (min): 30 min  Short Term Goals: Week 3: SLP Short Term Goal 1 (Week 3): Patient will solve problem related to self-care tasks with supervision assist multimodal cues for self-monitoring and correcting. SLP Short Term Goal 2 (Week 3): Patient will attend to left upper extremity and to left of midline with Mod I during functional tasks. SLP Short Term Goal 3 (Week 3): Patient will recall new, daily information with supervision verbal cues.   Skilled Therapeutic Interventions: Skilled treatment session focused on cognitive goals. SLP facilitated session by providing supervision verbal cues for generating a list of activities that patient can participate in at home to maximize cognitive function at home. Wife present and verbalized understanding of all information. Patient left upright in wheelchair with wife. Continue with current plan of care.      Function:  Cognition Comprehension Comprehension assist level: Understands complex 90% of the time/cues 10% of the time  Expression   Expression assist level: Expresses basic 90% of the time/requires cueing < 10% of the time.  Social Interaction Social Interaction assist level: Interacts appropriately 75 - 89% of the time - Needs redirection for appropriate language or to initiate interaction.  Problem Solving Problem solving assist level: Solves basic 90% of the time/requires cueing < 10% of the time  Memory Memory assist level: Recognizes or recalls 90% of the time/requires cueing < 10% of the time    Pain No/Denies Pain   Therapy/Group: Individual Therapy  Sedalia Greeson 04/01/2016, 4:05 PM

## 2016-04-01 NOTE — Progress Notes (Signed)
Physical Therapy Session Note  Patient Details  Name: Satira Mccallumntonio Weyers MRN: [id number] Date of Birth: 05/21/1965  Today's Date: 04/01/2016 PT Individual Time: 1300-1345 PT Individual Time Calculation (min): 45 min   Short Term Goals: Week 3:  PT Short Term Goal 1 (Week 3): =LTGs  Skilled Therapeutic Interventions/Progress Updates:    no c/o pain.  Session focus on LLE NMR, activity tolerance, and gait training.    Pt propels w/c to therapy gym supervision using BLEs for reciprocal stepping pattern retraining and activity tolerance.  Pt requires mod cues for use of LLE to assist with w/c propulsion.  Ambulatory transfer w/c>therapy mat with RW and steady assist.  NMR in standing with RLE toe taps 3 trials to fatigue for forced weight bearing on LLE with mod fade to min assist for balance and tactile cues for L knee extension. Gait training back to room at end of session with close supervision and occasional min assist for LOB to L/posterior.  Pt seated in w/c at end of session to await SLP.    Therapy Documentation Precautions:  Precautions Precautions: Fall Precaution Comments: L side weakness Restrictions Weight Bearing Restrictions: No   See Function Navigator for Current Functional Status.   Therapy/Group: Individual Therapy  Ladora DanielCaitlin E Penven-Crew 04/01/2016, 4:35 PM

## 2016-04-02 ENCOUNTER — Inpatient Hospital Stay (HOSPITAL_COMMUNITY): Payer: Self-pay | Admitting: Speech Pathology

## 2016-04-02 ENCOUNTER — Inpatient Hospital Stay (HOSPITAL_COMMUNITY): Payer: Self-pay | Admitting: Physical Therapy

## 2016-04-02 ENCOUNTER — Inpatient Hospital Stay (HOSPITAL_COMMUNITY): Payer: Self-pay | Admitting: Occupational Therapy

## 2016-04-02 ENCOUNTER — Inpatient Hospital Stay (HOSPITAL_COMMUNITY): Payer: Self-pay

## 2016-04-02 DIAGNOSIS — S5002XD Contusion of left elbow, subsequent encounter: Secondary | ICD-10-CM

## 2016-04-02 NOTE — Progress Notes (Signed)
Speech Language Pathology Daily Session Note  Patient Details  Name: Grant Garcia MRN: [id number] Date of Birth: Feb 16, 1965  Today's Date: 04/02/2016 SLP Individual Time: 1000-1100 SLP Individual Time Calculation (min): 60 min  Short Term Goals: Week 3: SLP Short Term Goal 1 (Week 3): Patient will solve problem related to self-care tasks with supervision assist multimodal cues for self-monitoring and correcting. SLP Short Term Goal 2 (Week 3): Patient will attend to left upper extremity and to left of midline with Mod I during functional tasks. SLP Short Term Goal 3 (Week 3): Patient will recall new, daily information with supervision verbal cues.   Skilled Therapeutic Interventions: Skilled treatment session focused on cognitive goals. SLP facilitated session by providing total A for use of cell phone for external aid to maximize recall of functional information. Due to difficulty of task, patient requested to use a paper calendar instead to facilitate recall in which he completed with Min A verbal cues for use of short-hand. Patient also participated in a previously learned, recall task with overall Mod I. Patient left upright in wheelchair with family present. Continue with current plan of care.      Function:  Cognition Comprehension Comprehension assist level: Understands complex 90% of the time/cues 10% of the time  Expression   Expression assist level: Expresses basic 90% of the time/requires cueing < 10% of the time.  Social Interaction Social Interaction assist level: Interacts appropriately 90% of the time - Needs monitoring or encouragement for participation or interaction.  Problem Solving Problem solving assist level: Solves basic 90% of the time/requires cueing < 10% of the time  Memory Memory assist level: Recognizes or recalls 90% of the time/requires cueing < 10% of the time    Pain No/Denies Pain   Therapy/Group: Individual Therapy  Arel Tippen 04/02/2016, 3:07  PM

## 2016-04-02 NOTE — Progress Notes (Signed)
Physical Therapy Session Note  Patient Details  Name: Grant Garcia MRN: [id number] Date of Birth: July 19, 1965  Today's Date: 04/02/2016 PT Individual Time: 0930-1000 PT Individual Time Calculation (min): 30 min   Short Term Goals: Week 3:  PT Short Term Goal 1 (Week 3): =LTGs  Skilled Therapeutic Interventions/Progress Updates:    no c/o pain.  Session focus on bed mobility to simulate home set up and side stepping to simulate bathroom entrance.  Pt completes ambulatory transfer to 19.5" bed with RW and close supervision.  Sit<>supine x2 initial min assist, supervision on second attempt.  Pt practices scooting R, L, and up on flat therapy mat to simulate home bed.  Side stepping to R x20' and around a corner with min assist provided by pt's wife.  Pt returned to w/c at end of session with close supervision from PT and min assist from wife.  Pt left in w/c with family and interpreter present in dayroom at end of session to await SLP.   Therapy Documentation Precautions:  Precautions Precautions: Fall Precaution Comments: L side weakness Restrictions Weight Bearing Restrictions: No   See Function Navigator for Current Functional Status.   Therapy/Group: Individual Therapy  Mohamedamin Nifong E Penven-Crew 04/02/2016, 11:28 AM

## 2016-04-02 NOTE — Progress Notes (Signed)
Occupational Therapy Session Note  Patient Details  Name: Grant Garcia MRN: [id number] Date of Birth: [birth year]  Today's Date: 04/02/2016 OT Individual Time: [id number] and 1345-1415 OT Individual Time Calculation (min): 60 min and 30 min   Short Term Goals: Week 3:  OT Short Term Goal 1 (Week 3): Pt will complete LB dressing with min assist at sit > stand level OT Short Term Goal 2 (Week 3): Pt will complete 1 grooming task in standing with min assist to increase standing balance  Skilled Therapeutic Interventions/Progress Updates:    1) Treatment session with focus on family education regarding bathroom transfers and functional mobility.  Engaged in toilet transfers with use of RW into bathroom.  Pt's wife provided therapist with home measurement sheet, revealing bathroom door only 21.5".  Educated on need to side step to access bathroom due to width of RW 24" even with wheels on inside of frame.  Plan to further address sidestepping to access bathroom, recommending only sidestepping to Rt each time for increased safety.  Performed transfers on/off BSC placed in ADL apt as pt will need to negotiate carpet with mobility.  Completed transfers with pt's wife, son, and brother each completing hands on.  Pt with LOB with each caregiver, requiring additional assist from therapist to correct balance with wife and son.  Recommend continued hands on practice to increase safety and confidence with caregivers.  2) Engaged in FES in sitting with focus on shoulder, bicep, and tricep activation.  NMES applied to supraspinatus and middle deltoid to help approximate shoulder joint to reduce sublux and reduce pain, followed by application to bicep and then triceps.  Pt tolerating intensity of 20 for 10 mins at shoulder, followed by 5 mins for elbow flexion, and 5 mins for elbow extension. Pt able to elicit shoulder elevation post stimulation and trace elbow extension.  No adverse reactions after treatment and  is skin intact. No c/o pain before, during, or after e-stim.   Therapy Documentation Precautions:  Precautions Precautions: Fall Precaution Comments: L side weakness Restrictions Weight Bearing Restrictions: No Pain: Pain Assessment Pain Assessment: No/denies pain  See Function Navigator for Current Functional Status.   Therapy/Group: Individual Therapy  [name], Mico Spark 04/02/2016, 12:18 PM

## 2016-04-02 NOTE — Progress Notes (Signed)
Physical Therapy Session Note  Patient Details  Name: Grant Garcia MRN: [id number] Date of Birth: 02-24-1965  Today's Date: 04/02/2016 PT Individual Time: 1300-1343 PT Individual Time Calculation (min): 43 min   Short Term Goals: Week 3:  PT Short Term Goal 1 (Week 3): =LTGs  Skilled Therapeutic Interventions/Progress Updates:    no c/o pain.  Session focus on NMR via w/c propulsion with BLEs, transfers, gait, and nustep.    Pt propelled w/c to therapy gym with BLEs with mod verbal cues for reciprocal stepping pattern.  Gait training through cones x2 with steady assist with focus on pacing and maintaining balance when turning and negotiating obstacles in preparation for home environment.  Nustep x8 minutes with BLEs focus on maintaining neutral LLE rotation, reciprocal stepping pattern retraining, and activity tolerance.  Pt transfers back to w/c with supervision and awaiting OT in gym.    Therapy Documentation Precautions:  Precautions Precautions: Fall Precaution Comments: L side weakness Restrictions Weight Bearing Restrictions: No   See Function Navigator for Current Functional Status.   Therapy/Group: Individual Therapy  Ladora DanielCaitlin E Penven-Crew 04/02/2016, 2:45 PM

## 2016-04-02 NOTE — Progress Notes (Signed)
[place] PHYSICAL MEDICINE & REHABILITATION     PROGRESS NOTE    Subjective/Complaints: Patient complaining of left elbow pain. Scraped this about 2 days ago.  ROS: Denies CP, SOB, N/V/D  Objective: Vital Signs: Blood pressure 117/62, pulse 61, temperature 97.6 F (36.4 C), temperature source Oral, resp. rate 18, height 5\' 6"  (1.676 m), weight 67.8 kg (149 lb 7.6 oz), SpO2 99 %. No results found. No results for input(s): WBC, HGB, HCT, PLT in the last 72 hours. No results for input(s): NA, K, CL, GLUCOSE, BUN, CREATININE, CALCIUM in the last 72 hours.  Invalid input(s): CO CBG (last 3)  No results for input(s): GLUCAP in the last 72 hours.  Wt Readings from Last 3 Encounters:  04/01/16 67.8 kg (149 lb 7.6 oz)  03/13/16 73.6 kg (162 lb 4.1 oz)    Physical Exam:  Constitutional: He appears well-developed. No distress.  HENT: Normocephalicand atraumatic.  Eyes: EOMI. No discharge.  Cardiovascular: RRR. No JVD. Respiratory:CTA. Unlabored.  GI: Soft. Bowel sounds are normal.  Musculoskeletal: No edema. Mild left olecranon and left lat epicondyle tenderness.  Skin: Skin is warmand dry. He is not diaphoretic.  Neurological:  Alert Motor. RUE and RLE motor 5/5.  LUE 2/5 Shoulder adduction remain0/5. mAS 1+/4 elbow flexors, wrist/hand 2/4 LLE 3+ hip /knee extension, ADF/PF 0/5 Mild Left central 7.  Fair insight and awareness.   Assessment/Plan: 1. Functional and mobility deficits secondary to right MCA infarct which require 3+ hours per day of interdisciplinary therapy in a comprehensive inpatient rehab setting. Physiatrist is providing close team supervision and 24 hour management of active medical problems listed below. Physiatrist and rehab team continue to assess barriers to discharge/monitor patient progress toward functional and medical goals.  Function:  Bathing Bathing position   Position: Shower  Bathing parts Body parts bathed by patient: Left arm, Chest,  Abdomen, Front perineal area, Right upper leg, Left upper leg, Right lower leg, Left lower leg Body parts bathed by helper: Right arm, Buttocks, Back  Bathing assist Assist Level: Touching or steadying assistance(Pt > 75%)      Upper Body Dressing/Undressing Upper body dressing   What is the patient wearing?: Pull over shirt/dress     Pull over shirt/dress - Perfomed by patient: Thread/unthread right sleeve, Put head through opening, Thread/unthread left sleeve, Pull shirt over trunk Pull over shirt/dress - Perfomed by helper: Pull shirt over trunk        Upper body assist Assist Level: Supervision or verbal cues, Set up   Set up : To obtain clothing/put away  Lower Body Dressing/Undressing Lower body dressing   What is the patient wearing?: Underwear, Pants, Shoes, American Family Insuranceed Hose, AFO Underwear - Performed by patient: Thread/unthread right underwear leg, Thread/unthread left underwear leg, Pull underwear up/down Underwear - Performed by helper: Thread/unthread left underwear leg Pants- Performed by patient: Thread/unthread right pants leg, Thread/unthread left pants leg Pants- Performed by helper: Pull pants up/down   Non-skid slipper socks- Performed by helper: Don/doff right sock, Don/doff left sock   Socks - Performed by helper: Don/doff right sock, Don/doff left sock Shoes - Performed by patient: Don/doff right shoe Shoes - Performed by helper: Don/doff left shoe   AFO - Performed by helper: Don/doff left AFO   TED Hose - Performed by helper: Don/doff left TED hose, Don/doff right TED hose  Lower body assist Assist for lower body dressing:  (Mod assist)      Toileting Toileting Toileting activity did not occur: Refused (no bm)  Toileting steps completed by patient: Adjust clothing prior to toileting, Performs perineal hygiene, Adjust clothing after toileting Toileting steps completed by helper: Adjust clothing prior to toileting, Performs perineal hygiene, Adjust clothing after  toileting Toileting Assistive Devices: Grab bar or rail  Toileting assist Assist level: Touching or steadying assistance (Pt.75%)   Transfers Chair/bed transfer   Chair/bed transfer method: Stand pivot Chair/bed transfer assist level: Moderate assist (Pt 50 - 74%/lift or lower) Chair/bed transfer assistive device: Armrests, Patent attorney     Max distance: 20 Assist level: Moderate assist (Pt 50 - 74%)   Wheelchair   Type: Manual Max wheelchair distance: 220 ft Assist Level: Supervision or verbal cues  Cognition Comprehension Comprehension assist level: Understands complex 90% of the time/cues 10% of the time  Expression Expression assist level: Expresses basic 90% of the time/requires cueing < 10% of the time.  Social Interaction Social Interaction assist level: Interacts appropriately 75 - 89% of the time - Needs redirection for appropriate language or to initiate interaction.  Problem Solving Problem solving assist level: Solves basic 90% of the time/requires cueing < 10% of the time  Memory Memory assist level: Recognizes or recalls 90% of the time/requires cueing < 10% of the time   Medical Problem List and Plan: 1. 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 DVT Prophylaxis/Anticoagulation: Subcutaneous Lovenox. Monitor platelet counts and any signs of bleeding 3. Pain Management: Tylenol as needed, Left elbow pain. Some tenderness along the olecranon as well as left lateral epicondyle . Will check x-ray. Given poor sensation and neglect of that area. 4. Mood: Provide emotional support, neuropsych following 5. Neuropsych: This patient iscapable of making decisions on hisown behalf. 6. Skin/Wound Care: Routine skin checks 7. Fluids/Electrolytes/Nutrition: Routine I&Os  CBC and bmet  nl 8.Dysphagia. Advanced to regular diet 9.Hyperlipidemia. Lipitor 10.Tobacco abuse. Counseling,  NicoDerm patch 11. 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  Sleep disturbance   with trazodone 50mg , cont current dose, 13.  Adjustment disorder and anxiety , tolerating celexa , Neuropsych to follow,  13. spasticity,3/5 increased  zanaflex 4mg  qhs, not  interfering with therapy 14.  Bradycardia mild asymptomaticLOS (Days) 19 A FACE TO FACE EVALUATION WAS PERFORMED  Erick Colace, MD 04/02/2016 6:19 AM

## 2016-04-03 ENCOUNTER — Inpatient Hospital Stay (HOSPITAL_COMMUNITY): Payer: Self-pay | Admitting: Physical Therapy

## 2016-04-03 ENCOUNTER — Inpatient Hospital Stay (HOSPITAL_COMMUNITY): Payer: Self-pay | Admitting: Occupational Therapy

## 2016-04-03 ENCOUNTER — Inpatient Hospital Stay (HOSPITAL_COMMUNITY): Payer: Self-pay | Admitting: Speech Pathology

## 2016-04-03 MED ORDER — NICOTINE 14 MG/24HR TD PT24: MEDICATED_PATCH | TRANSDERMAL | 0 refills | Status: DC

## 2016-04-03 MED ORDER — CITALOPRAM HYDROBROMIDE 10 MG PO TABS: 10.0000 mg | ORAL_TABLET | Freq: Every day | ORAL | 1 refills | Status: DC

## 2016-04-03 MED ORDER — ATORVASTATIN CALCIUM 80 MG PO TABS: 80.0000 mg | ORAL_TABLET | Freq: Every day | ORAL | 1 refills | Status: AC

## 2016-04-03 MED ORDER — CLOPIDOGREL BISULFATE 75 MG PO TABS: 75.0000 mg | ORAL_TABLET | Freq: Every day | ORAL | 1 refills | Status: AC

## 2016-04-03 MED ORDER — NICOTINE 14 MG/24HR TD PT24
14.0000 mg | MEDICATED_PATCH | Freq: Every day | TRANSDERMAL | Status: DC
  Administered 2016-04-03 – 2016-04-04 (×2): 14 mg via TRANSDERMAL
  Filled 2016-04-03 (×2): qty 1

## 2016-04-03 MED ORDER — TIZANIDINE HCL 4 MG PO TABS: 4.0000 mg | ORAL_TABLET | Freq: Every day | ORAL | 1 refills | Status: AC

## 2016-04-03 MED ORDER — ASPIRIN 325 MG PO TABS: 325.0000 mg | ORAL_TABLET | Freq: Every day | ORAL | Status: AC

## 2016-04-03 NOTE — Discharge Instructions (Signed)
Inpatient Rehab Discharge Instructions  Grant Garcia Discharge date and time: No discharge date for patient encounter.   Activities/Precautions/ Functional Status: Activity: activity as tolerated Diet: regular diet Wound Care: none needed Functional status:  ___ No restrictions     ___ Walk up steps independently ___ 24/7 supervision/assistance   ___ Walk up steps with assistance ___ Intermittent supervision/assistance  ___ Bathe/dress independently ___ Walk with walker     _x__ Bathe/dress with assistance ___ Walk Independently    ___ Shower independently ___ Walk with assistance    ___ Shower with assistance ___ No alcohol     ___ Return to work/school ________  COMMUNITY REFERRALS UPON DISCHARGE:   Home Health/Outpatient:   Boneta LucksJenny could not schedule any home therapies for you due to no agency in your area being willing to provide care without insurance coverage or paying out of pocket for visits.  ($168+ each visit)  She will put your name on the waiting list [facility]. Medical Equipment/Items Ordered:  16"x16" lightweight wheelchair with left half lap tray and basic cushion; rolling walker; bedside commode; tub transfer bench  Agency/Supplier:  [facility]         Phone:  [phone] MATCH Program:  Take card Boneta LucksJenny gave you and prescriptions from [facility] to one of the pharmacies on the list and pay $3 per medication. [facility]:  [facility] and take it to the Clinic as soon as possible to get him an appointment - would like for him to be seen in the next 2 weeks, if possible.  GENERAL COMMUNITY RESOURCES FOR PATIENT/FAMILY: Support Groups:  [facility]                              Meets the second Thursday of every month (except June, July, August) at 3 - 4 PM                              [facility]          Please call a couple of days before meeting [phone] so we can have an interpreter present for you.  Special Instructions: No driving or smoking STROKE/TIA DISCHARGE INSTRUCTIONS SMOKING Cigarette smoking nearly doubles your risk of having a stroke & is the single most alterable risk factor  If you smoke or have smoked in the last 12 months, you are advised to quit smoking for your health.  Most of the excess cardiovascular risk related to smoking disappears within a year of stopping.  Ask you doctor about anti-smoking medications  [place] Quit Line: 1-800-QUIT NOW  Free Smoking Cessation Classes [phone]  CHOLESTEROL Know your levels; limit fat & cholesterol in your diet  Lipid Panel     Component Value Date/Time   CHOL 190 03/12/2016 0500   TRIG 101 03/14/2016 1325   HDL 32 (L) 03/12/2016 0500   CHOLHDL 5.9 03/12/2016 0500   VLDL 24 03/12/2016 0500   LDLCALC 134 (H) 03/12/2016 0500      Many patients benefit from treatment even if their cholesterol is at goal.  Goal: Total Cholesterol (CHOL) less than 160  Goal:  Triglycerides (TRIG) less than 150  Goal:  HDL greater than 40  Goal:  LDL (LDLCALC) less than 100   BLOOD  PRESSURE American Stroke Association blood pressure target is less that 120/80 mm/Hg  Your discharge blood pressure is:  BP: 124/60  Monitor your blood pressure  Limit your salt and alcohol intake  Many individuals will require more than one medication for high blood pressure  DIABETES (A1c is a blood sugar average for last 3 months) Goal HGBA1c is under 7% (HBGA1c is blood sugar average for last 3 months)  Diabetes: No known diagnosis of diabetes    Lab Results  Component Value Date   HGBA1C 5.6 03/12/2016     Your HGBA1c can be lowered with medications, healthy diet, and exercise.  Check your blood sugar as directed by your physician  Call your physician if you experience unexplained or low blood sugars.  PHYSICAL  ACTIVITY/REHABILITATION Goal is 30 minutes at least 4 days per week  Activity: Increase activity slowly, Therapies: Physical Therapy: Home Health Return to work:   Activity decreases your risk of heart attack and stroke and makes your heart stronger.  It helps control your weight and blood pressure; helps you relax and can improve your mood.  Participate in a regular exercise program.  Talk with your doctor about the best form of exercise for you (dancing, walking, swimming, cycling).  DIET/WEIGHT Goal is to maintain a healthy weight  Your discharge diet is: Diet regular Room service appropriate? Yes; Fluid consistency: Thin  liquids Your height is:  Height: 5\' 6"  (167.6 cm) Your current weight is: Weight: 67.9 kg (149 lb 9.6 oz) Your Body Mass Index (BMI) is:  BMI (Calculated): 26.6  Following the type of diet specifically designed for you will help prevent another stroke.  Your goal weight range is:    Your goal Body Mass Index (BMI) is 19-24.  Healthy food habits can help reduce 3 risk factors for stroke:  High cholesterol, hypertension, and excess weight.  RESOURCES Stroke/Support Group:  Call [phone]   STROKE EDUCATION PROVIDED/REVIEWED AND GIVEN TO PATIENT Stroke warning signs and symptoms How to activate emergency medical system (call 911). Medications prescribed at discharge. Need for follow-up after discharge. Personal risk factors for stroke. Pneumonia vaccine given:  Flu vaccine given:  My questions have been answered, the writing is legible, and I understand these instructions.  I will adhere to these goals & educational materials that have been provided to me after my discharge from the hospital.      My questions have been answered and I understand these instructions. I will adhere to these goals and the provided educational materials after my discharge from the hospital.  Patient/Caregiver Signature _______________________________ Date __________  Clinician  Signature _______________________________________ Date __________  Please bring this form and your medication list with you to all your follow-up doctor's appointments.

## 2016-04-03 NOTE — Discharge Summary (Signed)
NAMEMarland Garcia  SPIRO, AUSBORN NO.:  [id number]  MEDICAL RECORD NO.:  [id number]  LOCATION:  4M08C                        FACILITY:  [facility]  PHYSICIAN:  Erick Colace, M.D.DATE OF BIRTH:  1965/08/27  DATE OF ADMISSION:  03/15/2016 DATE OF DISCHARGE:  04/04/2016                              DISCHARGE SUMMARY   DISCHARGE DIAGNOSES: 1. 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 2. Subcutaneous Lovenox for deep venous thrombosis prophylaxis. 3. Dysphagia. 4. Hyperlipidemia. 5. Tobacco abuse. 6. Sleep disturbance. 7. Bradycardia-asymptomatic.  HISTORY OF PRESENT ILLNESS:  This is a 51 year old right-handed Spanish- speaking male on no prescription medication prior to admission with history of tobacco abuse.  Lives with family independent prior to admission.  Presented to [facility] with left-sided weakness.  He was transferred to [facility] for further evaluation.  CT of the head showed evolving right MCA infarct as well as emergency large vessel occlusion on the right MCA per MRA.  MRI of the brain showed patchy multifocal right MCA territory infarct.  The patient did not receive tPA.  Interventional Radiology consulted and underwent thrombectomy.  Echocardiogram with ejection fraction of 55%, no wall motion abnormalities.  Neurology consulted, maintained on Plavix and aspirin for CVA prophylaxis.  Subcutaneous Lovenox for DVT prophylaxis. Dysphagia #1 thin liquid diet.  Physical and occupational therapy ongoing.  The patient was admitted for a comprehensive rehab program.  PAST MEDICAL HISTORY:  See discharge diagnoses.  SOCIAL HISTORY:  Lives with family independent.  Prior to admission, working as a Administrator.  FUNCTIONAL STATUS:  Upon admission to rehab services, was moderate assist, stand pivot transfers; max assist, sit to supine; max  total assist, activities of daily living.  PHYSICAL EXAMINATION:  VITAL SIGNS:  Blood pressure 132/71, pulse 49, temperature 98, respirations 16. GENERAL:  This was an alert male, primarily Spanish speaking.  Slurred speech.  Able to follow basic commands.  Pupils round and reactive to light. NECK:  Supple, nontender.  No JVD. CARDIAC:  Regular rate and rhythm.  No murmur. ABDOMEN:  Soft, nontender.  Good bowel sounds.  RESPIRATORY:  Clear to auscultation.  No wheeze.  REHABILITATION HOSPITAL COURSE:  The patient was admitted to inpatient rehab services with therapies initiated on a 3-hour daily basis, consisting of physical therapy, occupational therapy, speech therapy, and rehabilitation nursing.  The following issues were addressed during the patient's rehabilitation stay.  Pertaining to Mr. Moring right MCA infarct, remained stable, maintained on aspirin and Plavix therapy. He had undergone MCA thrombectomy.  He would follow up with Interventional Radiology.  Subcutaneous Lovenox for DVT prophylaxis.  No bleeding episodes.  Blood pressures remained well controlled on no current antihypertensive medication.  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  Bouts of insomnia which did improve throughout his rehab course.  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  He was participating with his therapies. Lipitor for hyperlipidemia.  He did have a history of tobacco abuse.  He was on a NicoDerm patch.  Received full counsel in regard to cessation of  nicotine products.  The patient received weekly collaborative interdisciplinary team conferences to discuss estimated length of stay, family teaching, any barriers to his discharge.  Working with energy conservation.  Wheelchair mobility.  Gait training through [place] with steady assistance.  Ambulate with a rolling walker close supervision. Side stepping to the right with minimal  assistance.  Sit-to-supine with min assist to supervision.  Activities of daily living and homemaking. He engaged in toilet transfers with rolling walker into the bathroom and supervision.  Ongoing education provided with his family.  Completes transfers with the patient's wife, son, and brother with supervision. His diet had been advanced to a regular consistency.  He was able to communicate his needs.  Full family teaching was completed and discharged to home.  DISCHARGE MEDICATIONS:  Included; 1. Aspirin 325 mg p.o. daily. 2. Lipitor 80 mg p.o. daily. 3. Celexa 10 mg p.o. daily. 4. Plavix 75 mg daily. 5. Pepcid 20 mg daily. 6. Nicoderm patch taper as directed. 7. Zanaflex 4 mg p.o. at bedtime. 8. Tylenol as needed.  DIET:  His diet was regular.  FOLLOWUP:  He would follow up with Dr. Claudette LawsAndrew Kirsteins [facility] as advised; Dr. Roda ShuttersXu, Neurology Services; and with Interventional Radiology.     Grant Garcia, P.A.   ______________________________ Erick ColaceAndrew E. Kirsteins, M.D.    DA/MEDQ  D:  04/03/2016  T:  04/03/2016  Job:  [id number]  cc:   Erick ColaceAndrew E. Kirsteins, M.D. Dr. Marvel PlanJindong Xu

## 2016-04-03 NOTE — Progress Notes (Signed)
Physical Therapy Discharge Summary  Patient Details  Name: Grant Garcia MRN: [id number] Date of Birth: [birth year]  Today's Date: 04/03/2016 PT Individual Time: 1300-1420 PT Individual Time Calculation (min): 80 min    Patient has met 11 of 11 long term goals due to improved activity tolerance, improved balance, improved postural control, increased strength, increased range of motion, ability to compensate for deficits, functional use of  left lower extremity, improved attention, improved awareness and improved coordination.  Patient to discharge at household ambulatory with supervision/min assist, and w/c level in the community with supervision level  .   Patient's care partner is independent to provide the necessary physical assistance at discharge.  Reasons goals not met:  Pt can be supervision but can also require up to mod assist for step negotiation when simulating home entry, but wife is able to provide.    Recommendation:  Patient will benefit from ongoing skilled PT services in home health setting to continue to advance safe functional mobility, address ongoing impairments in balance, L NMR, L attention, , and minimize fall risk.  Equipment: 16x16 w/c with basic cushion, RW  Reasons for discharge: treatment goals met and discharge from hospital  Patient/family agrees with progress made and goals achieved: Yes   Skilled PT Intervention: No c/o pain.  Session focus on transfers, gait on even/uneven surfaces, gait up/down ramp, and stair negotiation.  Pt propels w/c throughout unit with BLEs for NMR and activity tolerance.  Car transfer with supervision/steady assist with RW.  Pt ambulates up/down ramp and across mulch pit with steady assist for balance.  Mod assist for curb step negotiation from mulch pit due to compliant surface, but pt can require as little as close supervision to negotiate a curb step in previous sessions.  Stair negotiation up/down 12 steps with R hand rail with  min guard.  Gait training with RW with close supervision x150'.  Pt returned to room at end of session and positioned in w/c.  PT provided with spanish HEP for pt to take home and discussed pro-bono PT clinic at [facility] and [facility].    PT Discharge Precautions/Restrictions Precautions Precautions: Fall Precaution Comments: L side weakness Restrictions Weight Bearing Restrictions: No Vital Signs Therapy Vitals Temp: 98.6 F (37 C) Temp Source: Oral Pulse Rate: (!) 54 Resp: 18 BP: 122/61 Patient Position (if appropriate): Sitting Oxygen Therapy SpO2: 98 % O2 Device: Not Delivered Pain Pain Assessment Pain Assessment: No/denies pain Vision/Perception     Cognition Overall Cognitive Status: Impaired/Different from baseline Arousal/Alertness: Awake/alert Orientation Level: Oriented X4 Attention: Selective Focused Attention: Appears intact Sustained Attention: Appears intact Selective Attention: Appears intact Memory: Impaired Memory Impairment: Decreased recall of new information Awareness: Impaired Awareness Impairment: Anticipatory impairment Problem Solving: Impaired Problem Solving Impairment: Functional complex Safety/Judgment: Impaired Sensation Sensation Light Touch: Impaired by gross assessment (self reports some tingling with movement in LUE/LLE) Proprioception: Appears Intact Coordination Gross Motor Movements are Fluid and Coordinated: No Fine Motor Movements are Fluid and Coordinated: No Coordination and Movement Description: improving LLE, ongoing dense L hemiparesis in LUE Motor  Motor Motor: Hemiplegia;Abnormal tone Motor - Discharge Observations: improving hemiparesis LE>UE  Mobility Bed Mobility Bed Mobility: Sit to Supine;Supine to Sit Rolling Left: 5: Supervision Supine to Sit: 5: Supervision Transfers Transfers: Yes Sit to Stand: 5: Supervision Stand to Sit: 5: Supervision Stand Pivot Transfers: 4: Min guard Squat  Pivot Transfers: 5: Supervision Locomotion  Ambulation Ambulation: Yes Ambulation/Gait Assistance: 4: Min guard Ambulation Distance (Feet): 150 Feet Assistive  device: Rolling walker Gait Gait: Yes Gait Pattern: Impaired Gait Pattern: Step-to pattern;Decreased stance time - left;Decreased weight shift to right;Poor foot clearance - left;Left flexed knee in stance Stairs / Additional Locomotion Stairs: Yes Wheelchair Mobility Wheelchair Mobility: Yes Wheelchair Assistance: 5: Careers information officer: Both lower extermities;Right upper extremity;Right lower extremity Wheelchair Parts Management: Needs assistance Distance: 150  Trunk/Postural Assessment  Cervical Assessment Cervical Assessment: Within Functional Limits Thoracic Assessment Thoracic Assessment: Within Functional Limits Lumbar Assessment Lumbar Assessment: Within Functional Limits Postural Control Postural Control: Deficits on evaluation Righting Reactions: intact Protective Responses: insufficient  Balance Balance Balance Assessed: Yes Static Sitting Balance Static Sitting - Level of Assistance: 6: Modified independent (Device/Increase time) Static Standing Balance Static Standing - Level of Assistance: 5: Stand by assistance Dynamic Standing Balance Dynamic Standing - Balance Support: During functional activity;Left upper extremity supported;Right upper extremity supported Dynamic Standing - Level of Assistance: 5: Stand by assistance Extremity Assessment      RLE Assessment RLE Assessment: Within Functional Limits LLE Assessment LLE Assessment: Exceptions to WFL LLE AROM (degrees) LLE Overall AROM Comments: decreased 2/2 tone and weakness LLE Strength Left Hip Flexion: 3/5 Left Knee Flexion: 2/5 Left Knee Extension:  (able to activate in L stance phase but unable to activate against gravity in sitting through full ROM)   See Function Navigator for Current Functional Status.  Shahin Knierim E  Penven-Crew 04/03/2016, 4:01 PM

## 2016-04-03 NOTE — Discharge Summary (Signed)
Discharge summary job [id number]

## 2016-04-03 NOTE — Progress Notes (Signed)
Speech Language Pathology Session Note & Discharge Summary  Patient Details  Name: Grant Garcia MRN: [id number] Date of Birth: December 24, 1965  Today's Date: 04/03/2016 SLP Individual Time: 1000-1055 SLP Individual Time Calculation (min): 55 min   Skilled Therapeutic Interventions:  Skilled treatment session focused on cognitive and dysphagia goals. SLP facilitated session by providing supervision verbal cues for recall of events from previous therapy sessions. Patient also propelled himself to and from dayroom with Mod I. Patient consumed snack of regular textures without overt s/s of aspiration and adaquate oral clearance and demonstrated alternating attention between self-feeding and functional conversation with Mod I. Patient left upright in wheelchair with family present.     Patient has met 7 of 7 long term goals.  Patient to discharge at overall Supervision;Min level.  Reasons goals not met: N/A    Clinical Impression/Discharge Summary:   Patient has made excellent gains and has met 7 of 7 LTG's this reporting period. Currently, patient is consuming regular textures with thin liquids without overt s/s of aspiration and is Mod I for use of swallowing compensatory strategies. Patient also requires overall supervision-Min A verbal cues for functional problem solving, emergent awareness, attention, initiation and recall of new information. Patient and family education is complete and patient will discharge home with 24 hour supervision. Patient would benefit from f/u SLP services to maximize his cognitive function and overall functional independence in order to reduce caregiver burden.   Care Partner:  Caregiver Able to Provide Assistance: Yes  Type of Caregiver Assistance: Physical;Cognitive  Recommendation:  Home Health SLP;24 hour supervision/assistance  Rationale for SLP Follow Up: Maximize cognitive function and independence;Reduce caregiver burden   Equipment: N/A   Reasons for  discharge: Discharged from hospital;Treatment goals met   Patient/Family Agrees with Progress Made and Goals Achieved: Yes   Function:  Eating Eating   Modified Consistency Diet: No Eating Assist Level: Swallowing techniques: self managed;Set up assist for   Eating Set Up Assist For: Opening containers       Cognition Comprehension Comprehension assist level: Understands complex 90% of the time/cues 10% of the time  Expression   Expression assist level: Expresses basic 90% of the time/requires cueing < 10% of the time.  Social Interaction Social Interaction assist level: Interacts appropriately 90% of the time - Needs monitoring or encouragement for participation or interaction.  Problem Solving Problem solving assist level: Solves basic 90% of the time/requires cueing < 10% of the time  Memory Memory assist level: Recognizes or recalls 90% of the time/requires cueing < 10% of the time   [name] 04/03/2016, 3:58 PM

## 2016-04-03 NOTE — Progress Notes (Signed)
[place] PHYSICAL MEDICINE & REHABILITATION     PROGRESS NOTE    Subjective/Complaints: No issues overnite  ROS: Denies CP, SOB, N/V/D  Objective: Vital Signs: Blood pressure 132/69, pulse (!) 47, temperature 98.2 F (36.8 C), temperature source Oral, resp. rate 18, height 5\' 6"  (1.676 m), weight 67.8 kg (149 lb 7.6 oz), SpO2 98 %. Dg Elbow 2 Views Left  Result Date: 04/02/2016 CLINICAL DATA:  Fall 1 week ago with persistent left elbow pain, initial encounter EXAM: LEFT ELBOW - 2 VIEW COMPARISON:  None. FINDINGS: There is no evidence of fracture, dislocation, or joint effusion. Mild spurring is noted from the radial head as well as from the olecranon process. IMPRESSION: Mild degenerative change without acute abnormality. Electronically Signed   By: Alcide CleverMark  Lukens M.D.   On: 04/02/2016 16:01   No results for input(s): WBC, HGB, HCT, PLT in the last 72 hours. No results for input(s): NA, K, CL, GLUCOSE, BUN, CREATININE, CALCIUM in the last 72 hours.  Invalid input(s): CO CBG (last 3)  No results for input(s): GLUCAP in the last 72 hours.  Wt Readings from Last 3 Encounters:  04/01/16 67.8 kg (149 lb 7.6 oz)  03/13/16 73.6 kg (162 lb 4.1 oz)    Physical Exam:  Constitutional: He appears well-developed. No distress.  HENT: Normocephalicand atraumatic.  Eyes: EOMI. No discharge.  Cardiovascular: RRR. No JVD. Respiratory:CTA. Unlabored.  GI: Soft. Bowel sounds are normal.  Musculoskeletal: No edema. Mild left olecranon and left lat epicondyle tenderness.  Skin: Skin is warmand dry. He is not diaphoretic.  Neurological:  Alert Motor. RUE and RLE motor 5/5.  LUE 2/5 Shoulder adduction remain0/5. mAS 1+/4 elbow flexors, wrist/hand 2/4 LLE 3+ hip /knee extension, ADF/PF 0/5 Mild Left central 7.  Fair insight and awareness.   Assessment/Plan: 1. Functional and mobility deficits secondary to right MCA infarct which require 3+ hours per day of interdisciplinary therapy in a  comprehensive inpatient rehab setting. Physiatrist is providing close team supervision and 24 hour management of active medical problems listed below. Physiatrist and rehab team continue to assess barriers to discharge/monitor patient progress toward functional and medical goals.  Function:  Bathing Bathing position   Position: Shower  Bathing parts Body parts bathed by patient: Left arm, Chest, Abdomen, Front perineal area, Right upper leg, Left upper leg, Right lower leg, Left lower leg Body parts bathed by helper: Right arm, Buttocks, Back  Bathing assist Assist Level: Touching or steadying assistance(Pt > 75%)      Upper Body Dressing/Undressing Upper body dressing   What is the patient wearing?: Pull over shirt/dress     Pull over shirt/dress - Perfomed by patient: Thread/unthread right sleeve, Put head through opening, Thread/unthread left sleeve, Pull shirt over trunk Pull over shirt/dress - Perfomed by helper: Pull shirt over trunk        Upper body assist Assist Level: Supervision or verbal cues, Set up   Set up : To obtain clothing/put away  Lower Body Dressing/Undressing Lower body dressing   What is the patient wearing?: Underwear, Pants, Shoes, American Family Insuranceed Hose, AFO Underwear - Performed by patient: Thread/unthread right underwear leg, Thread/unthread left underwear leg, Pull underwear up/down Underwear - Performed by helper: Thread/unthread left underwear leg Pants- Performed by patient: Thread/unthread right pants leg, Thread/unthread left pants leg Pants- Performed by helper: Pull pants up/down   Non-skid slipper socks- Performed by helper: Don/doff right sock, Don/doff left sock   Socks - Performed by helper: Don/doff right sock, Don/doff left  sock Shoes - Performed by patient: Don/doff right shoe Shoes - Performed by helper: Don/doff left shoe   AFO - Performed by helper: Don/doff left AFO   TED Hose - Performed by helper: Don/doff left TED hose, Don/doff right  TED hose  Lower body assist Assist for lower body dressing:  (Mod assist)      Toileting Toileting Toileting activity did not occur: Refused (no bm) Toileting steps completed by patient: Adjust clothing prior to toileting, Performs perineal hygiene, Adjust clothing after toileting Toileting steps completed by helper: Adjust clothing prior to toileting, Performs perineal hygiene, Adjust clothing after toileting Toileting Assistive Devices: Grab bar or rail  Toileting assist Assist level: Touching or steadying assistance (Pt.75%)   Transfers Chair/bed transfer   Chair/bed transfer method: Stand pivot Chair/bed transfer assist level: Moderate assist (Pt 50 - 74%/lift or lower) Chair/bed transfer assistive device: Armrests, Patent attorney     Max distance: 20 Assist level: Moderate assist (Pt 50 - 74%)   Wheelchair   Type: Manual Max wheelchair distance: 220 ft Assist Level: Supervision or verbal cues  Cognition Comprehension Comprehension assist level: Understands complex 90% of the time/cues 10% of the time  Expression Expression assist level: Expresses basic 90% of the time/requires cueing < 10% of the time.  Social Interaction Social Interaction assist level: Interacts appropriately 90% of the time - Needs monitoring or encouragement for participation or interaction.  Problem Solving Problem solving assist level: Solves basic 90% of the time/requires cueing < 10% of the time  Memory Memory assist level: Recognizes or recalls 90% of the time/requires cueing < 10% of the time   Medical Problem List and Plan: 1. Left hemiparesis withsecondary to right MCA infarction as well as large vessel occlusion on the right MCA status post thrombectomy -Cont CIR PT, OT, SLP, plan d/c in am -[facility] pt wearing both 2. DVT Prophylaxis/Anticoagulation: Subcutaneous Lovenox. Monitor platelet counts and any signs of bleeding 3. Pain Management: Tylenol as needed,  Left elbow pain improving . Negative x-ray. 4. Mood: Provide emotional support, neuropsych following 5. Neuropsych: This patient iscapable of making decisions on hisown behalf. 6. Skin/Wound Care: Routine skin checks 7. Fluids/Electrolytes/Nutrition: Routine I&Os  CBC and bmet nl 8.Dysphagia. Advanced to regular diet 9.Hyperlipidemia. Lipitor 10.Tobacco abuse. Counseling,  NicoDerm patch 11. 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  Sleep disturbance   with trazodone 50mg , cont current dose, 13.  Adjustment disorder and anxiety , tolerating celexa , Neuropsych to follow,  13. spasticity,3/5 increased  zanaflex 4mg  qhs, not  interfering with therapy 14.  Bradycardia asymptomaticLOS (Days) 20 A FACE TO FACE EVALUATION WAS PERFORMED  Erick Colace, MD 04/03/2016 6:57 AM

## 2016-04-03 NOTE — Patient Care Conference (Signed)
Inpatient RehabilitationTeam Conference and Plan of Care Update Date: 04/01/2016   Time: 11:10 AM    Patient Name: Grant Garcia      Medical Record Number: [id number]  Date of Birth: 03/29/1965 Sex: Male         Room/Bed: 4M08C/4M08C-01 Payor Info: Payor: MEDICAID POTENTIAL / Plan: MEDICAID POTENTIAL / Product Type: *No Product type* /    Admitting Diagnosis: cva  Admit Date/Time:  03/14/2016  4:18 PM Admission Comments: No comment available   Primary Diagnosis:  Right middle cerebral artery stroke [facility]) Principal Problem: Right middle cerebral artery stroke [facility])  Patient Active Problem List   Diagnosis Date Noted  . Sleep disturbance   . Adjustment disorder with mixed anxiety and depressed mood   . Left hemiparesis ([place]) 03/18/2016  . Left-sided neglect 03/18/2016  . Right middle cerebral artery stroke ([place]) 03/14/2016  . Smoker   . Hyperlipidemia   . CVA (cerebral vascular accident) ([place]) 03/11/2016  . Respiratory failure [facility])     Expected Discharge Date: Expected Discharge Date: 04/04/16  Team Members Present: Physician leading conference: Dr. Alysia Penna Social Worker Present: Alfonse Alpers, LCSW Nurse Present: Elliot Cousin, RN PT Present: Dwyane Dee, PT OT Present: Simonne Come, OT SLP Present: Weston Anna, SLP PPS Coordinator present : Daiva Nakayama, RN, CRRN     Current Status/Progress Goal Weekly Team Focus  Medical   poor safety awareness and memory, depression  improve safety awareness, improve left neglect  d/c planning   Bowel/Bladder   continent of B/B; LBM 03/29/16; senokot given 03/31/16  maintain current level of continence  monitor for changes in continence   Swallow/Nutrition/ Hydration   Regular textures with thin liquids, supervision   Supervision   Goals Met    ADL's   min-mod assist overall  Min assist overall  ADL retraining, trunk control, transfers, hemi-technique with bathing and dressing, pt/family education   Mobility   supervision/min for transfers, min/mod for gait and step depending on fatigue  supervision ambulatory   safety awareness, NMR, balance, gait, step negotiation for home entry   Communication   Supervision-Mod I  Mod I   use of an increased vocal intensity    Safety/Cognition/ Behavioral Observations  Supervision-Min A  Supervision   Family Education    Pain   denies pain/discomfort  <3  monitor for nonverbal signs of pain/discomfort and treat accordingly   Skin   dressing to L arm; per report---family stated that pt hit elbowon table  pt will be free of skin breakdown  monitor for changes in skin integrity    Rehab Goals Patient on target to meet rehab goals: Yes Rehab Goals Revised: none *See Care Plan and progress notes for long and short-term goals.  Barriers to Discharge: still needs 24/7 physical assist, wife burned out    Possible Resolutions to Barriers:  family training, extended family helping    Discharge Planning/Teaching Needs:  Pt to return to his home with his wife to provide supervision.  Pt's wife, son, and other family members have been trained by therapy team.   Team Discussion:  Pt is having some return in LLE, but not upper.  Has some tone in ULE.  Brothers spending the night to give the wife a break.  PT practiced car tx with wife.  Pt only ambulate short distances at home.  Wife needs to stand closer to pt with ambulation and therapists to reiterate this in training.  Pt on regular diet and cognition is okay.  Pt's safety awareness and memory are the focus, but otherwise overall well.    Revisions to Treatment Plan:  none   Continued Need for Acute Rehabilitation Level of Care: The patient requires daily medical management by a physician with specialized training in physical medicine and rehabilitation for the following conditions: Daily direction of a multidisciplinary physical rehabilitation program to ensure safe treatment while eliciting the highest outcome  that is of practical value to the patient.: Yes Daily medical management of patient stability for increased activity during participation in an intensive rehabilitation regime.: Yes Daily analysis of laboratory values and/or radiology reports with any subsequent need for medication adjustment of medical intervention for : Neurological problems;Mood/behavior problems  Anslee Micheletti, Silvestre Mesi 04/03/2016, 1:49 PM

## 2016-04-03 NOTE — Progress Notes (Signed)
Occupational Therapy Session Note  Patient Details  Name: Grant Garcia MRN: [id number] Date of Birth: September 30, 1965  Today's Date: 04/03/2016 OT Individual Time: [id number] OT Individual Time Calculation (min): 60 min    Short Term Goals: Week 3:  OT Short Term Goal 1 (Week 3): Pt will complete LB dressing with min assist at sit > stand level OT Short Term Goal 2 (Week 3): Pt will complete 1 grooming task in standing with min assist to increase standing balance  Skilled Therapeutic Interventions/Progress Updates:    Completed ADL retraining with family education with pt's wife regarding accessing bathroom at home.  Pt ambulated into ADL apt bathroom with RW and min assist, sidestepping towards Rt to simulate entrance in to home bathroom.  Pt's wife provided hands on assistance during transfers with min cues from therapist to maintain hands on and close proximity during mobility and transfers.  Pt's wife provided steady assist while pt washed buttocks and pulled pants over hips.  Pt and pt's wife report no concerns upon d/c home, wife reports increased confidence with mobility after hands on education last few sessions.    Therapy Documentation Precautions:  Precautions Precautions: Fall Precaution Comments: L side weakness Restrictions Weight Bearing Restrictions: No General:   Vital Signs: Therapy Vitals Temp: 98.6 F (37 C) Temp Source: Oral Pulse Rate: (!) 54 Resp: 18 BP: 122/61 Patient Position (if appropriate): Sitting Oxygen Therapy SpO2: 98 % O2 Device: Not Delivered Pain: Pain Assessment Pain Assessment: No/denies pain  See Function Navigator for Current Functional Status.   Therapy/Group: Individual Therapy  [name], Hera Celaya 04/03/2016, 4:09 PM

## 2016-04-03 NOTE — Progress Notes (Signed)
Occupational Therapy Discharge Summary  Patient Details  Name: Grant Garcia MRN: [id number] Date of Birth: 03/06/1965  Patient has met 11 of 11 long term goals due to improved activity tolerance, improved balance, postural control and improved awareness.  Patient to discharge at overall Min Assist level.  Patient's care partner is independent to provide the necessary physical and cognitive assistance at discharge.  Hands on education completed with pt's wife, son, and brother.  Pt's wife has completed majority of mobility and is able to verbalize safety with mobility and demonstrating improved safety with assisting pt.  Reasons goals not met: N/A  Recommendation:  Patient will benefit from ongoing skilled OT services in home health setting to continue to advance functional skills in the area of BADL and Reduce care partner burden.  Equipment: tub transfer bench and 3 in 1  Reasons for discharge: treatment goals met and discharge from hospital  Patient/family agrees with progress made and goals achieved: Yes  OT Discharge Precautions/Restrictions  Precautions Precautions: Fall Precaution Comments: L side weakness Restrictions Weight Bearing Restrictions: No General   Vital Signs Therapy Vitals Temp: 98.6 F (37 C) Temp Source: Oral Pulse Rate: (!) 54 Resp: 18 BP: 122/61 Patient Position (if appropriate): Sitting Oxygen Therapy SpO2: 98 % O2 Device: Not Delivered Pain Pain Assessment Pain Assessment: No/denies pain ADL ADL ADL Comments: see Functional Assessment Tool Vision/Perception  Vision- History Baseline Vision/History: No visual deficits Patient Visual Report: No change from baseline  Cognition Overall Cognitive Status: Impaired/Different from baseline Arousal/Alertness: Awake/alert Orientation Level: Oriented X4 Attention: Selective Focused Attention: Appears intact Sustained Attention: Appears intact Selective Attention: Appears intact Memory:  Impaired Memory Impairment: Decreased recall of new information Awareness: Impaired Awareness Impairment: Anticipatory impairment Problem Solving: Impaired Problem Solving Impairment: Functional complex Safety/Judgment: Impaired Sensation Sensation Light Touch: Impaired by gross assessment (self reports some tingling with movement in LUE/LLE) Proprioception: Appears Intact Coordination Gross Motor Movements are Fluid and Coordinated: No Fine Motor Movements are Fluid and Coordinated: No Coordination and Movement Description: improving LLE, ongoing dense L hemiparesis in LUE Motor  Motor Motor: Hemiplegia;Abnormal tone Motor - Discharge Observations: improving hemiparesis LE>UE Mobility  Bed Mobility Bed Mobility: Sit to Supine;Supine to Sit Rolling Left: 5: Supervision Supine to Sit: 5: Supervision Transfers Sit to Stand: 5: Supervision Stand to Sit: 5: Supervision  Trunk/Postural Assessment  Cervical Assessment Cervical Assessment: Within Functional Limits Thoracic Assessment Thoracic Assessment: Within Functional Limits Lumbar Assessment Lumbar Assessment: Within Functional Limits Postural Control Postural Control: Deficits on evaluation Righting Reactions: intact Protective Responses: insufficient  Balance Balance Balance Assessed: Yes Static Sitting Balance Static Sitting - Level of Assistance: 6: Modified independent (Device/Increase time) Static Standing Balance Static Standing - Level of Assistance: 5: Stand by assistance Dynamic Standing Balance Dynamic Standing - Balance Support: During functional activity;Left upper extremity supported;Right upper extremity supported Dynamic Standing - Level of Assistance: 5: Stand by assistance Extremity/Trunk Assessment RUE Assessment RUE Assessment: Within Functional Limits LUE Assessment LUE Assessment: Exceptions to WFL LUE AROM (degrees) LUE Overall AROM Comments: Pt is developing shoulder elevation and trace  shoulder flexion and elbow flextion, but no functional movement LUE Tone LUE Tone: Hypertonic Hypertonic Details: tone developing in elbow/biceps   See Function Navigator for Current Functional Status.  HOXIE, SARAH 04/03/2016, 4:28 PM 

## 2016-04-03 NOTE — Progress Notes (Signed)
Social Work Patient ID: Grant Garcia, male   DOB: 1965/10/03, 51 y.o.   MRN: [id number]   CSW met with pt and his family to update them on team conference discussion and to discuss d/c plans.  Pt/family are prepared for 04-04-16 d/c and no change has been made to that date.  Pt's extended family will be here for about a week after his d/c to help with his transition.  Pt's children will help wife as they are able, too.  Pt will have DME, but no therapy f/u as he lives out of [place] service area and they are the only agency to assist with charity care.  CSW tried to ask [facility] if they would assist and they will accept payment plan, but expect services paid for in full.  CSW will refer pt to [facility] pro-bono clinic to see if they can see pt.  Expect there to be a waiting list with that program.  CSW set up [facility] program and encouraged pt's wife to enroll him at [facility] for PCP follow up.  CSW will continue to be available via telephone should pt have any questions post d/c.

## 2016-04-04 LAB — CREATININE, SERUM: Creatinine, Ser: 0.7 mg/dL (ref 0.61–1.24)

## 2016-04-04 NOTE — Progress Notes (Signed)
[facility] PHYSICAL MEDICINE & REHABILITATION     PROGRESS NOTE    Subjective/Complaints: Excited about d/c  ROS: Denies CP, SOB, N/V/D  Objective: Vital Signs: Blood pressure 121/75, pulse (!) 47, temperature 98.8 F (37.1 C), temperature source Oral, resp. rate 18, height 5\' 6"  (1.676 m), weight 67.8 kg (149 lb 7.6 oz), SpO2 97 %. Dg Elbow 2 Views Left  Result Date: 04/02/2016 CLINICAL DATA:  Fall 1 week ago with persistent left elbow pain, initial encounter EXAM: LEFT ELBOW - 2 VIEW COMPARISON:  None. FINDINGS: There is no evidence of fracture, dislocation, or joint effusion. Mild spurring is noted from the radial head as well as from the olecranon process. IMPRESSION: Mild degenerative change without acute abnormality. Electronically Signed   By: Alcide Clever M.D.   On: 04/02/2016 16:01   No results for input(s): WBC, HGB, HCT, PLT in the last 72 hours.  Recent Labs  04/03/16 2343  CREATININE 0.70   CBG (last 3)  No results for input(s): GLUCAP in the last 72 hours.  Wt Readings from Last 3 Encounters:  04/01/16 67.8 kg (149 lb 7.6 oz)  03/13/16 73.6 kg (162 lb 4.1 oz)    Physical Exam:  Constitutional: He appears well-developed. No distress.  HENT: Normocephalicand atraumatic.  Eyes: EOMI. No discharge.  Cardiovascular: RRR. No JVD. Respiratory:CTA. Unlabored.  GI: Soft. Bowel sounds are normal.  Musculoskeletal: No edema. Mild left olecranon and left lat epicondyle tenderness.  Skin: Skin is warmand dry. He is not diaphoretic.  Neurological:  Alert Motor. RUE and RLE motor 5/5.  LUE 2/5 Shoulder adduction remain0/5. mAS 1+/4 elbow flexors, wrist/hand 2/4 LLE 3+ hip /knee extension, ADF/PF 0/5 Mild Left central 7.  Fair insight and awareness.   Assessment/Plan: 1. Functional and mobility deficits secondary to right MCA infarct  Stable for D/C today F/u PCP in 3-4 weeks F/u PM&R 2 weeks See D/C summary See D/C  instructions  Function:  Bathing Bathing position   Position: Shower  Bathing parts Body parts bathed by patient: Left arm, Chest, Abdomen, Front perineal area, Right upper leg, Left upper leg, Right lower leg, Left lower leg Body parts bathed by helper: Right arm, Buttocks  Bathing assist Assist Level: Touching or steadying assistance(Pt > 75%)      Upper Body Dressing/Undressing Upper body dressing   What is the patient wearing?: Pull over shirt/dress     Pull over shirt/dress - Perfomed by patient: Thread/unthread right sleeve, Put head through opening, Thread/unthread left sleeve, Pull shirt over trunk Pull over shirt/dress - Perfomed by helper: Pull shirt over trunk        Upper body assist Assist Level: Supervision or verbal cues, Set up   Set up : To obtain clothing/put away  Lower Body Dressing/Undressing Lower body dressing   What is the patient wearing?: Underwear, Pants, Shoes, [employer], AFO Underwear - Performed by patient: Thread/unthread right underwear leg, Thread/unthread left underwear leg, Pull underwear up/down Underwear - Performed by helper: Thread/unthread left underwear leg Pants- Performed by patient: Thread/unthread right pants leg, Thread/unthread left pants leg, Pull pants up/down Pants- Performed by helper: Pull pants up/down   Non-skid slipper socks- Performed by helper: Don/doff right sock, Don/doff left sock   Socks - Performed by helper: Don/doff right sock, Don/doff left sock Shoes - Performed by patient: Don/doff right shoe Shoes - Performed by helper: Don/doff left shoe   AFO - Performed by helper: Don/doff left AFO   TED Hose - Performed by helper:  Don/doff left TED hose, Don/doff right TED hose  Lower body assist Assist for lower body dressing: Touching or steadying assistance (Pt > 75%)      Toileting Toileting Toileting activity did not occur: Refused (no bm) Toileting steps completed by patient: Adjust clothing prior to toileting,  Performs perineal hygiene, Adjust clothing after toileting Toileting steps completed by helper: Adjust clothing prior to toileting, Performs perineal hygiene, Adjust clothing after toileting Toileting Assistive Devices: Grab bar or rail  Toileting assist Assist level: Touching or steadying assistance (Pt.75%)   Transfers Chair/bed transfer   Chair/bed transfer method: Stand pivot, Squat pivot Chair/bed transfer assist level: Supervision or verbal cues Chair/bed transfer assistive device: Armrests, Patent attorneyWalker     Locomotion Ambulation     Max distance: 150 Assist level: Supervision or verbal cues   Wheelchair   Type: Manual Max wheelchair distance: 150 Assist Level: Supervision or verbal cues  Cognition Comprehension Comprehension assist level: Understands complex 90% of the time/cues 10% of the time  Expression Expression assist level: Expresses basic 90% of the time/requires cueing < 10% of the time.  Social Interaction Social Interaction assist level: Interacts appropriately 90% of the time - Needs monitoring or encouragement for participation or interaction.  Problem Solving Problem solving assist level: Solves basic 90% of the time/requires cueing < 10% of the time  Memory Memory assist level: Recognizes or recalls 90% of the time/requires cueing < 10% of the time   Medical Problem List and Plan: 1. Left hemiparesis withsecondary to right MCA infarction as well as large vessel occlusion on the right MCA status post thrombectomy -Cont CIR PT, OT, SLP, plan d/c today -WHO/PRAFO pt wearing both 2. DVT Prophylaxis/Anticoagulation: Subcutaneous Lovenox. Monitor platelet counts and any signs of bleeding 3. Pain Management: Tylenol as needed, Left elbow pain improving . Negative x-ray. 4. Mood: Provide emotional support, neuropsych following 5. Neuropsych: This patient iscapable of making decisions on hisown behalf. 6. Skin/Wound Care: Routine skin checks 7.  Fluids/Electrolytes/Nutrition: Routine I&Os  CBC and bmet nl 8.Dysphagia. Advanced to regular diet 9.Hyperlipidemia. Lipitor 10.Tobacco abuse. Counseling,  NicoDerm patch 11. 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  Sleep disturbance   with trazodone 50mg , cont current dose, 13.  Adjustment disorder and anxiety , tolerating celexa , Neuropsych to follow,  13. spasticity,3/5 increased  zanaflex 4mg  qhs, not  interfering with therapy 14.  Bradycardia asymptomaticLOS (Days) 21 A FACE TO FACE EVALUATION WAS PERFORMED  Erick ColaceKIRSTEINS,ANDREW E, MD 04/04/2016 7:41 AM

## 2016-04-04 NOTE — Progress Notes (Signed)
Pt. And family got d/c papers.Pt. Ready to go home with family.

## 2016-04-06 NOTE — Progress Notes (Signed)
Social Work Discharge Note  The overall goal for the admission was met for:   Discharge location: Yes - home with family  Length of Stay: Yes - 21 days  Discharge activity level: Yes - supervision to minimal assistance (for ADLs)  Home/community participation: Yes  Services provided included: MD, RD, PT, OT, SLP, RN, Pharmacy, Neuropsych and SW  Financial Services: Other: Pt does not have insurance.  Follow-up services arranged: DME: 16"x16" lightweight wheelchair with left half lap tray and basic cushion; rolling walker; 3-in-1 commode; tub transfer bench from [place], Other: CSW will try to have pt added to the [facility] pro-bono clinic.  Therapists gave family exercises to work on with pt at home. and Patient/Family has no preference for HH/DME agencies  Comments (or additional information): Pt to d/c with family who have received family training and report feeling ready to take care of pt at home.  DME has been delivered to room and pt will not receive home health due to no agency to provide charity care in the area of his home.  CSW gave him [facility] information and will try to put him on [facility] pro-bono clinic waiting list.  Also gave pt [facility] program.   Patient/Family verbalized understanding of follow-up arrangements: Yes  Individual responsible for coordination of the follow-up plan: pt with his family  Confirmed correct DME delivered: Trey Sailors 04/06/2016    Kalenna Millett, Silvestre Mesi

## 2016-04-22 ENCOUNTER — Other Ambulatory Visit: Payer: Self-pay | Admitting: Interventional Radiology

## 2016-04-22 DIAGNOSIS — Z8673 Personal history of transient ischemic attack (TIA), and cerebral infarction without residual deficits: Secondary | ICD-10-CM

## 2016-04-27 ENCOUNTER — Encounter: Payer: Self-pay | Attending: Physical Medicine & Rehabilitation

## 2016-04-27 ENCOUNTER — Ambulatory Visit (HOSPITAL_BASED_OUTPATIENT_CLINIC_OR_DEPARTMENT_OTHER): Payer: Self-pay | Admitting: Physical Medicine & Rehabilitation

## 2016-04-27 ENCOUNTER — Encounter: Payer: Self-pay | Admitting: Physical Medicine & Rehabilitation

## 2016-04-27 VITALS — BP 139/74 | HR 62

## 2016-04-27 DIAGNOSIS — E785 Hyperlipidemia, unspecified: Secondary | ICD-10-CM | POA: Insufficient documentation

## 2016-04-27 DIAGNOSIS — F17200 Nicotine dependence, unspecified, uncomplicated: Secondary | ICD-10-CM

## 2016-04-27 DIAGNOSIS — I63511 Cerebral infarction due to unspecified occlusion or stenosis of right middle cerebral artery: Secondary | ICD-10-CM

## 2016-04-27 DIAGNOSIS — Z79899 Other long term (current) drug therapy: Secondary | ICD-10-CM | POA: Insufficient documentation

## 2016-04-27 DIAGNOSIS — I69354 Hemiplegia and hemiparesis following cerebral infarction affecting left non-dominant side: Secondary | ICD-10-CM | POA: Insufficient documentation

## 2016-04-27 DIAGNOSIS — G8194 Hemiplegia, unspecified affecting left nondominant side: Secondary | ICD-10-CM

## 2016-04-27 DIAGNOSIS — F17210 Nicotine dependence, cigarettes, uncomplicated: Secondary | ICD-10-CM | POA: Insufficient documentation

## 2016-04-27 DIAGNOSIS — Z7902 Long term (current) use of antithrombotics/antiplatelets: Secondary | ICD-10-CM | POA: Insufficient documentation

## 2016-04-27 DIAGNOSIS — F329 Major depressive disorder, single episode, unspecified: Secondary | ICD-10-CM | POA: Insufficient documentation

## 2016-04-27 NOTE — Progress Notes (Signed)
Subjective:  patient is primarily Spanish-speaking, but his daughter is able to translate  Patient ID: Grant Garcia, male    DOB: [birth year], 51 y.o.   MRN: [id number] 51 year old right-handed Spanish- speaking male on no prescription medication prior to admission with history of tobacco abuse.  Lives with family independent prior to admission.  Presented to [facility] with left-sided weakness.  He was transferred to [facility] for further evaluation.  CT of the head showed evolving right MCA infarct as well as emergency large vessel occlusion on the right MCA per MRA.  MRI of the brain showed patchy multifocal right MCA territory infarct.  The patient did not receive tPA.  Interventional Radiology consulted and underwent thrombectomy.  Echocardiogram with ejection fraction of 55%, no wall motion abnormalities.  Neurology consulted, maintained on Plavix and aspirin for CVA prophylaxis.  Subcutaneous Lovenox for DVT prophylaxis. DATE OF ADMISSION:  03/15/2016 DATE OF DISCHARGE:  04/04/2016  HPI Lives at home , needs help with bathing, can dress himself.  Walks Mod I with walker    Pain Inventory Average Pain 0 Pain Right Now 4 My pain is dull  In the last 24 hours, has pain interfered with the following? General activity 0 Relation with others 0 Enjoyment of life 0 What TIME of day is your pain at its worst? . Sleep (in general) Good  Pain is worse with: . Pain improves with: . Relief from Meds: .  Mobility walk with assistance use a walker ability to climb steps?  yes do you drive?  no  Function Do you have any goals in this area?  yes  Neuro/Psych bowel control problems numbness depression anxiety  Prior Studies Any changes since last visit?  no  Physicians involved in your care Any changes since last visit?  no   No family history on file. Social History   Social History  . Marital status: Married    Spouse name: N/A  . Number  of children: N/A  . Years of education: N/A   Social History Main Topics  . Smoking status: Current Every Day Smoker    Packs/day: 2.00    Years: 30.00    Types: Cigarettes  . Smokeless tobacco: Never Used  . Alcohol use No  . Drug use: No  . Sexual activity: Not on file   Other Topics Concern  . Not on file   Social History Narrative  . No narrative on file   Past Surgical History:  Procedure Laterality Date  . IR GENERIC HISTORICAL  03/11/2016   IR ANGIO VERTEBRAL SEL VERTEBRAL UNI R MOD SED 03/11/2016 [name], DO [facility]-INTERV RAD  . IR GENERIC HISTORICAL  03/11/2016   IR ANGIO INTRA EXTRACRAN SEL COM CAROTID INNOMINATE UNI L MOD SED 03/11/2016 [name], DO [facility]  . IR GENERIC HISTORICAL  03/11/2016   IR PERCUTANEOUS ART THROMBECTOMY/INFUSION INTRACRANIAL INC DIAG ANGIO 03/11/2016 [name], DO [facility]  . IR GENERIC HISTORICAL  03/11/2016   IR US GUIDE VASC ACCESS RIGHT 03/11/2016 [name], DO [facility]  . RADIOLOGY WITH ANESTHESIA N/A 03/11/2016   Procedure: RADIOLOGY WITH ANESTHESIA;  Surgeon: Medication Radiologist, MD;  Location: [facility] OR;  Service: Radiology;  Laterality: N/A;   Past Medical History:  Diagnosis Date  . Medical history non-contributory    There were no vitals taken for this visit.  Opioid Risk Score:   Fall Risk Score:  `1  Depression screen PHQ 2/9  No flowsheet data found.  Review  of Systems  Constitutional: Negative.   HENT: Negative.   Eyes: Negative.   Respiratory: Negative.   Cardiovascular: Negative.   Gastrointestinal: Negative.   Endocrine: Negative.   Genitourinary: Negative.   Musculoskeletal: Negative.   Skin: Negative.   Allergic/Immunologic: Negative.   Neurological: Negative.   Hematological: Negative.   Psychiatric/Behavioral: Negative.   All other systems reviewed and are negative.      Objective:   Physical Exam  Constitutional: He is oriented to person, place, and time. He appears  well-developed and well-nourished.  HENT:  Head: Normocephalic and atraumatic.  Eyes: Conjunctivae and EOM are normal. Pupils are equal, round, and reactive to light. No scleral icterus.  Neck: Normal range of motion. No JVD present.  Cardiovascular: Normal rate, regular rhythm and normal heart sounds.   Pulmonary/Chest: No stridor.  Neurological: He is alert and oriented to person, place, and time.  motor strength is trace, left shoulder, protraction, retraction, trace biceps, 0 at the finger flexors or extensors Motor strength in left lower is4 minus at the left hip flexor and knee extensor 0 at the ankle dorsiflexor Tone modified [place] 1 at the finger and thumb flexors 1 at the wrist flexor, clonus at the finger flexors on the left sideline. No evidence of excessive hamstring tone  Psychiatric: He has a normal mood and affect.  Nursing note and vitals reviewed.         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 Hyperlipidemia continue Lipitor have made referral to primary care  3.Secondary stroke prevention continue clopidogrel, follow up with interventional radiology and neurology, patient and family are aware of appointments   4.Post stroke depression. Continue Celexa  5. Smoking cessation , successful thus far, cont nicoderm

## 2016-04-27 NOTE — Patient Instructions (Addendum)
Try stopping tizanidine to see if spasms return at night

## 2016-05-06 ENCOUNTER — Ambulatory Visit
Admission: RE | Admit: 2016-05-06 | Discharge: 2016-05-06 | Disposition: A | Discharge status: Home / Self Care | Payer: No Typology Code available for payment source | Source: Ambulatory Visit | Attending: Interventional Radiology | Admitting: Interventional Radiology

## 2016-05-06 DIAGNOSIS — Z8673 Personal history of transient ischemic attack (TIA), and cerebral infarction without residual deficits: Secondary | ICD-10-CM

## 2016-05-06 HISTORY — PX: IR RADIOLOGIST EVAL & MGMT: IMG5224

## 2016-05-06 NOTE — Progress Notes (Signed)
Chief Complaint: Prior stroke  Referring Physician(s): Dr. Wynn Banker  History of Present Illness: Landers Prajapati is a 51 y.o. male presenting as a scheduled follow-up as a postoperative visit, status post endovascular treatment for emergent large vessel occlusion right MCA, 03/11/2016.  He presented with acute left sided weakness developing more than 4.5 hours before his presentation.   He was first evaluated at [facility] ED with imaging revealing ELVO of the right MCA.   ASPECTS = 10, no hemorrhage  Transferred after neurology contacted as Code stroke.   Perfusion study performed with repeat non-contrast head CT upon arrival, with no ASPECTS deterioration, and large penumbra of .   NIHSS >7, and within 24 hours.     The angiogram and attempted right MCA thrombectomy revealed evidence of chronic stenosis underlying the acute occlusion.  TICI 1 was achieved, with collateral flow perfusing the right hemisphere.     Mr Lemmons comes to [place] today with his wife and his daughter.  He has been undergoing rehabilitation, and has now returned home.    From the standpoint of the right CFA puncture site, he has recovered well with now swelling or residual bruising.  No pain.   He has no complaints of right sided symptoms.    He has residual left upper extremity symptoms and left lower externally symptoms. He has left facial weakness.  He continues to see Dr. Wynn Banker.    His wife and daughter expressed to me concern for possible depression, as he seems more withdrawn and occasionally is found in tears at home.  I did let them know that this is a hard recovery and that his feelings are entirely normal after a stroke like this.    The have assured me that he has successfully stopped smoking, of which I congratulated him.     Past Medical History:  Diagnosis Date  . Medical history non-contributory     Past Surgical History:  Procedure Laterality Date  .  IR GENERIC HISTORICAL  03/11/2016   IR ANGIO VERTEBRAL SEL VERTEBRAL UNI R MOD SED 03/11/2016 [name], DO [facility]  . IR GENERIC HISTORICAL  03/11/2016   IR ANGIO INTRA EXTRACRAN SEL COM CAROTID INNOMINATE UNI L MOD SED 03/11/2016 [name], DO [facility]  . IR GENERIC HISTORICAL  03/11/2016   IR PERCUTANEOUS ART THROMBECTOMY/INFUSION INTRACRANIAL INC DIAG ANGIO 03/11/2016 [name], DO [facility]  . IR GENERIC HISTORICAL  03/11/2016   IR US GUIDE VASC ACCESS RIGHT 03/11/2016 [name], DO [facility]  . RADIOLOGY WITH ANESTHESIA N/A 03/11/2016   Procedure: RADIOLOGY WITH ANESTHESIA;  Surgeon: Medication Radiologist, MD;  Location: [facility];  Service: Radiology;  Laterality: N/A;    Allergies: Patient has no known allergies.  Medications: Prior to Admission medications   Medication Sig Start Date End Date Taking? Authorizing Provider  aspirin 325 MG tablet Take 1 tablet (325 mg total) by mouth daily. 04/03/16  Yes Daniel J Angiulli, PA-C  atorvastatin (LIPITOR) 80 MG tablet Take 1 tablet (80 mg total) by mouth daily at 6 PM. 04/03/16  Yes Daniel J Angiulli, PA-C  citalopram (CELEXA) 10 MG tablet Take 1 tablet (10 mg total) by mouth daily. 04/03/16  Yes Daniel J Angiulli, PA-C  clopidogrel (PLAVIX) 75 MG tablet Take 1 tablet (75 mg total) by mouth daily. 04/03/16  Yes Daniel J Angiulli, PA-C  nicotine (NICODERM CQ - DOSED IN MG/24 HOURS) 14 mg/24hr patch 14 mg patch daily 3 weeks then 7 mg patch  daily 3 weeks and stop 04/03/16  Yes Daniel J Angiulli, PA-C  tiZANidine (ZANAFLEX) 4 MG tablet Take 1 tablet (4 mg total) by mouth at bedtime. 04/03/16  Yes Daniel J Angiulli, PA-C     No family history on file.  Social History   Social History  . Marital status: Married    Spouse name: N/A  . Number of children: N/A  . Years of education: N/A   Social History Main Topics  . Smoking status: Current Every Day Smoker    Packs/day: 2.00    Years: 30.00    Types: Cigarettes  .  Smokeless tobacco: Never Used  . Alcohol use No  . Drug use: No  . Sexual activity: Not on file   Other Topics Concern  . Not on file   Social History Narrative  . No narrative on file      Review of Systems: A 12 point ROS discussed and pertinent positives are indicated in the HPI above.  All other systems are negative.  Review of Systems  Vital Signs: BP 138/76 (BP Location: Right Arm, Patient Position: Sitting, Cuff Size: Normal)   Pulse (!) 54   Temp 98.3 F (36.8 C) (Oral)   Resp 16   Ht  (1.727 m)   Wt 165 lb (74.8 kg)   SpO2 97%   BMI 25.09 kg/m   Physical Exam  CN: weakness of the left facial muscles with puffing cheeks and smiling.   Weak left shoulder shrug.   RUE: 5/5 strength.  LUE: 0/5 at shoulder, elbow and wrist.  0/5 intrinsic hand muscles RLE: 5/5 strength.  LLE: 3/5 hip, knee.  Reduced ankle flexion and extension Imaging: No results found.  Labs:  CBC:  Recent Labs  03/13/16 0354 03/14/16 0628 03/16/16 0701 03/30/16 0328  WBC 10.1 7.9 8.5 10.8*  HGB 13.3 13.7 14.0 13.1  HCT 40.2 41.0 41.5 39.7  PLT 123* 142* 162 224    COAGS:  Recent Labs  03/11/16 0920  INR 0.98    BMP:  Recent Labs  03/13/16 0354 03/14/16 0628 03/16/16 0701 03/20/16 2337 03/27/16 2337 03/30/16 0328 04/03/16 2343  NA 139 140 136  --   --  137  --   K 3.6 3.8 3.9  --   --  3.7  --   CL 105 107 103  --   --  106  --   CO2 --   --  26  --   GLUCOSE 110* 104* 114*  --   --  112*  --   BUN --   --  13  --   CALCIUM 9.0 9.1 9.3  --   --  9.2  --   CREATININE 0.67 0.60* 0.73 0.72 0.93 0.72 0.70  GFRNONAA >60 >60 >60 >60 >60 >60 >60  GFRAA >60 >60 >60 >60 >60 >60 >60    LIVER FUNCTION TESTS:  Recent Labs  03/16/16 0701  BILITOT 1.3*  AST 26  ALT 27  ALKPHOS 53  PROT 6.8  ALBUMIN 3.5    TUMOR MARKERS: No results for input(s): AFPTM, CEA, CA199, CHROMGRNA in the last 8760 hours.  Assessment and Plan:  Mr Velis is  51 year old returning today for post op visit, status post attempted thrombectomy for right MCA emergent large vessel occlusion, with procedure performed 03/11/2016.  Regarding the puncture site of the right hip, he has recovered well. He has no complaints.  He does have residual weakness of the left upper extremity, and continues to see neurology.  I did reassure him and his family that his feelings of depression are normal, and he should certainly bring these feelings up with his physician so that they may be properly addressed.  I congratulated him on his current success with smoking cessation, as well as the hard work he continues to put him with his rehabilitation.    Electronically Signed: [name] 05/06/2016, 2:32 PM   I spent a total of    25 Minutes in face to face in clinical consultation, greater than 50% of which was counseling/coordinating care for ELVO, post endovascular thrombectomy, right hemisphere stroke.

## 2016-05-25 ENCOUNTER — Ambulatory Visit: Payer: Self-pay | Admitting: Physical Medicine & Rehabilitation

## 2016-06-24 ENCOUNTER — Ambulatory Visit (INDEPENDENT_AMBULATORY_CARE_PROVIDER_SITE_OTHER): Payer: Self-pay | Admitting: Neurology

## 2016-06-24 ENCOUNTER — Encounter: Payer: Self-pay | Admitting: Neurology

## 2016-06-24 ENCOUNTER — Encounter (INDEPENDENT_AMBULATORY_CARE_PROVIDER_SITE_OTHER): Payer: Self-pay

## 2016-06-24 VITALS — BP 117/69 | HR 65 | Ht 66.0 in | Wt 149.0 lb

## 2016-06-24 DIAGNOSIS — I63511 Cerebral infarction due to unspecified occlusion or stenosis of right middle cerebral artery: Secondary | ICD-10-CM

## 2016-06-24 DIAGNOSIS — I69354 Hemiplegia and hemiparesis following cerebral infarction affecting left non-dominant side: Secondary | ICD-10-CM

## 2016-06-24 DIAGNOSIS — E785 Hyperlipidemia, unspecified: Secondary | ICD-10-CM

## 2016-06-24 DIAGNOSIS — F17200 Nicotine dependence, unspecified, uncomplicated: Secondary | ICD-10-CM

## 2016-06-24 NOTE — Progress Notes (Signed)
STROKE NEUROLOGY FOLLOW UP NOTE  NAME: Grant Garcia DOB: [birth year]  REASON FOR VISIT: stroke follow up HISTORY FROM: daughter and chart  Today we had the pleasure of seeing Grant Garcia in follow-up at our Neurology Clinic. Pt was accompanied by wife and daughter. Pt is spanish speaking, and daughter acted as Engineer, technical sales.   History Summary [name] is a 51 y.o. male with PMH of smoking admitted on 03/11/16 for feeling weak all over. CT showed a right MCA infarct with large right penumbra. He did not receive IV t-PA due to delay in arrival. Taken to IR showed R MCA occlusion with good collateral flow from ACA. Mechanical thrombectomy not performed due to concerns of chronic occlusion. However, his significant weakness not consistent with chronic occlusion. MRI showed scattered small to moderate large infarcts at right MCA territory, MRA showed right M1 cut off. EF 55-60%. Recommended 30 day cardiac event monitoring as outpt. LDL 134 and A1C 5.6. Put on DAPT and lipitor 80 on discharge to CIR. BP goal 130-150  Interval History During the interval time, the patient has been doing better. Finished CIR and home therapy. He follows with Dr. Wynn Banker in clinic but had no show. He still has left UE significant weakness but LLE improved well. Has quit smoking since stroke. BP stable 117/69 today. However, his medicaid application is pending.    REVIEW OF SYSTEMS: Full 14 system review of systems performed and notable only for those listed below and in HPI above, all others are negative:  Constitutional:   Cardiovascular:  Ear/Nose/Throat:   Skin:  Eyes:   Respiratory:   Gastroitestinal:  Diarrhea, consitipation Genitourinary:  Hematology/Lymphatic:   Endocrine:  Musculoskeletal:   Allergy/Immunology:   Neurological:   Psychiatric: depression, anxiety, change in appetite, disinterested in activities Sleep:   The following represents the patient's updated allergies and  side effects list: No Known Allergies  The neurologically relevant items on the patient's problem list were reviewed on today's visit.  Neurologic Examination  A problem focused neurological exam (12 or more points of the single system neurologic examination, vital signs counts as 1 point, cranial nerves count for 8 points) was performed.  Blood pressure 117/69, pulse 65, height 5\' 6"  (1.676 m), weight 149 lb (67.6 kg).  General - Well nourished, well developed, in no apparent distress.  Ophthalmologic - Fundi not visualized due to noncooperation.  Cardiovascular - Regular rate and rhythm with no murmur.  Mental Status -  Level of arousal and orientation to time, place, and person were intact. Language including expression, naming, repetition, comprehension was assessed and found intact. Fund of Knowledge was assessed and was intact.  Cranial Nerves II - XII - II - Visual field intact OU. III, IV, VI - Extraocular movements intact. V - Facial sensation intact bilaterally. VII - left facial droop. VIII - Hearing & vestibular intact bilaterally. X - Palate elevates symmetrically. XI - Chin turning & shoulder shrug intact bilaterally. XII - Tongue protrusion towards left  Motor Strength - The patient's strength was normal in RUE and RLE, however, LUE deltoid 0/5, bicep 2/5, tricep 1/5, finger grip 0/5, LLE 4/5 proximal, 3/5 knee extension and 0/5 DF. LLE brace. and pronator drift was absent.  Bulk was normal and fasciculations were absent.   Motor Tone - Muscle tone was assessed at the neck and appendages and was increased at LUE.  Reflexes - The patient's reflexes were 1+ in all extremities and he had no pathological reflexes.  Sensory -  Light touch, temperature/pinprick were assessed and were normal.    Coordination - The patient had normal movements in the right hand with no ataxia or dysmetria.  Tremor was absent.  Gait and Station - able to walk with walker but left  hemiparetic gait.   Functional score  mRS = 3   0 - No symptoms.   1 - No significant disability. Able to carry out all usual activities, despite some symptoms.   2 - Slight disability. Able to look after own affairs without assistance, but unable to carry out all previous activities.   3 - Moderate disability. Requires some help, but able to walk unassisted.   4 - Moderately severe disability. Unable to attend to own bodily needs without assistance, and unable to walk unassisted.   5 - Severe disability. Requires constant nursing care and attention, bedridden, incontinent.   6 - Dead.   NIH Stroke Scale   Level Of Consciousness 0=Alert; keenly responsive 1=Not alert, but arousable by minor stimulation 2=Not alert, requires repeated stimulation 3=Responds only with reflex movements 0  LOC Questions to Month and Age 46=Answers both questions correctly 1=Answers one question correctly 2=Answers neither question correctly 0  LOC Commands      -Open/Close eyes     -Open/close grip 0=Performs both tasks correctly 1=Performs one task correctly 2=Performs neighter task correctly 0  Best Gaze 0=Normal 1=Partial gaze palsy 2=Forced deviation, or total gaze paresis 0  Visual 0=No visual loss 1=Partial hemianopia 2=Complete hemianopia 3=Bilateral hemianopia (blind including cortical blindness) 0  Facial Palsy 0=Normal symmetrical movement 1=Minor paralysis (asymmetry) 2=Partial paralysis (lower face) 3=Complete paralysis (upper and lower face) 2  Motor  0=No drift, limb holds posture for full 10 seconds 1=Drift, limb holds posture, no drift to bed 2=Some antigravity effort, cannot maintain posture, drifts to bed 3=No effort against gravity, limb falls 4=No movement Right Arm 0     Leg 0    Left Arm 3     Leg 1  Limb Ataxia 0=Absent 1=Present in one limb 2=Present in two limbs 0  Sensory 0=Normal 1=Mild to moderate sensory loss 2=Severe to total sensory loss 0  Best  Language 0=No aphasia, normal 1=Mild to moderate aphasia 2=Mute, global aphasia 3=Mute, global aphasia 0  Dysarthria 0=Normal 1=Mild to moderate 2=Severe, unintelligible or mute/anarthric 0  Extinction/Neglect 0=No abnormality 1=Extinction to bilateral simultaneous stimulation 2=Profound neglect 0  Total   6     Data reviewed: I personally reviewed the images and agree with the radiology interpretations.  Ct Head Code Stroke Wo Contrast` 03/11/2016 1. Stable noncontrast CT appearance of the brain since 0544 hours today: Hypodensity in the right lentiform nucleus, but no other CT changes of right MCA infarct. No acute intracranial hemorrhage. 2. ASPECTS is 9.    Ct Cerebral Perfusion W Contrast 03/11/2016 Large area of right MCA territory penumbra. Clinically suspected small volume core infarct in the right lentiform not detected. Overall very favorable CTP characteristics for endovascular reperfusion. I am advised that they are prepping for Neurointervention at the time of this dictation. Electronically Signed   By: Odessa Fleming M.D.   On: 03/11/2016 09:27   Cerebral angio 03/11/2016 Status post cerebral angiogram with attempted mechanical thrombectomy of right MCA occlusion. After 2 passes with local aspiration and stentreiver technology, there is persistent occlusion and TICI 1 flow. Excellent collateral flow perfusing the right MCA territory. My impression is that there is likely underlying stenosis at the site of occlusion given the collateral flow, and  that further manipulation may cause dissection or hemorrhage. Given the excellent collateral flow and absence of core infarction on the perfusion imaging, we elected to defer further attempts at thrombectomy. Deployment of Exoseal for hemostasis.   Ct Head Wo Contrast 03/11/2016 Stable postprocedural CT. No progression of the lenticulostriate infarct and no acute hemorrhage.   Mr Brain Wo Contrast 03/12/2016 Acute patchy multifocal  RIGHT MCA territory infarct. No hemorrhage.   Mr Maxine GlennMra Head/brain Wo Cm 03/12/2016 Moderately motion degraded examination. RIGHT M1 occlusion, no collateral vessels by time-of-flight technique.   TTE - Left ventricle: The cavity size was normal. Systolic function was normal. The estimated ejection fraction was in the range of 55% to 60%. Wall motion was normal; there were no regional wall motion abnormalities. Marked bradycardia (44 bpm) limits evaluation of LV diastolic function.  Component     Latest Ref Rng & Units 03/12/2016  Cholesterol     0 - 200 mg/dL 161190  Triglycerides     <150 mg/dL 096118  HDL Cholesterol     >40 mg/dL 32 (L)  Total CHOL/HDL Ratio     RATIO 5.9  VLDL     0 - 40 mg/dL 24  LDL (calc)     0 - 99 mg/dL 045134 (H)  Hemoglobin [id number]     4.8 - 5.6 % 5.6  Mean Plasma Glucose     mg/dL 811114    Assessment: As you may recall, he is a 51 y.o. Hispanic male with PMH of smoking admitted on 03/11/16 for right MCA infarct with large right penumbra. DSA showed R MCA occlusion with good collateral flow from ACA. Mechanical thrombectomy not performed due to concerns of chronic occlusion. However, his significant weakness not consistent with chronic occlusion. MRI showed scattered small to moderate large infarcts at right MCA territory, MRA showed right M1 cut off. EF 55-60%. Recommended 30 day cardiac event monitoring as outpt. LDL 134 and A1C 5.6. Put on DAPT and lipitor 80 on discharge to CIR. BP goal 130-150. He still has left UE significant weakness but LLE improved well. Has quit smoking since stroke. Will do cardiac monitoring and sleep study once medical insurance available. He needs outpt PT/OT and TCD bubble study. Finished DAPT for 3 months, will d/c ASA.  Plan:  - continue plavix and lipitor for stroke prevention - discontinue ASA  - check BP at home periodically - Follow up with your primary care physician for stroke risk factor modification. Recommend maintain  blood pressure goal <130/80, diabetes with hemoglobin A1c goal below 7.0% and lipids with LDL cholesterol goal below 70 mg/dL.  - continue abstain from smoking - will consider 30 day cardiac monitoring and sleep study once Medicaid approved.  - will refer to outpt PT/OT - will do TCD bubble study to rule out PFO - call Dr. Wynn BankerKirsteins to reschedule for follow up - follow up in 3 months with me.   I spent more than 25 minutes of face to face time with the patient. Greater than 50% of time was spent in counseling and coordination of care. We discussed about outpt PT/OT, further stroke work up, follow up with PMR.    Orders Placed This Encounter  Procedures  . Ambulatory referral to Physical Therapy    Referral Priority:   Routine    Referral Type:   Physical Medicine    Referral Reason:   Specialty Services Required    Requested Specialty:   Physical Therapy    Number of Visits Requested:  1  . Ambulatory referral to Occupational Therapy    Referral Priority:   Routine    Referral Type:   Occupational Therapy    Referral Reason:   Specialty Services Required    Requested Specialty:   Occupational Therapy    Number of Visits Requested:   1    No orders of the defined types were placed in this encounter.   Patient Instructions  - continue plavix and lipitor for stroke prevention - discontinue ASA  - check BP at home periodically - Follow up with your primary care physician for stroke risk factor modification. Recommend maintain blood pressure goal <130/80, diabetes with hemoglobin A1c goal below 7.0% and lipids with LDL cholesterol goal below 70 mg/dL.  - continue abstain from smoking - will consider heart monitoring and sleep study once Medicaid approved.  - will refer to outpt PT/OT - will do TCD bubble study to rule out PFO - call Dr. Wynn Banker to reschedule for follow up - follow up in 3 months with me.    Marvel Plan, MD PhD [facility] [address]  [address] [place], [place] [address] [phone]

## 2016-06-24 NOTE — Patient Instructions (Addendum)
-   continue plavix and lipitor for stroke prevention - discontinue ASA  - check BP at home periodically - Follow up with your primary care physician for stroke risk factor modification. Recommend maintain blood pressure goal <130/80, diabetes with hemoglobin A1c goal below 7.0% and lipids with LDL cholesterol goal below 70 mg/dL.  - continue abstain from smoking - will consider heart monitoring and sleep study once Medicaid approved.  - will refer to outpt PT/OT - will do TCD bubble study to rule out PFO - call Dr. Wynn BankerKirsteins to reschedule for follow up - follow up in 3 months with me.

## 2016-07-10 ENCOUNTER — Encounter: Payer: Self-pay | Admitting: Interventional Radiology

## 2016-08-20 ENCOUNTER — Ambulatory Visit (HOSPITAL_BASED_OUTPATIENT_CLINIC_OR_DEPARTMENT_OTHER): Payer: Self-pay | Admitting: Physical Medicine & Rehabilitation

## 2016-08-20 ENCOUNTER — Encounter: Payer: Self-pay | Attending: Physical Medicine & Rehabilitation

## 2016-08-20 ENCOUNTER — Encounter: Payer: Self-pay | Admitting: Physical Medicine & Rehabilitation

## 2016-08-20 VITALS — BP 124/73 | HR 66

## 2016-08-20 DIAGNOSIS — G8114 Spastic hemiplegia affecting left nondominant side: Secondary | ICD-10-CM

## 2016-08-20 DIAGNOSIS — I69398 Other sequelae of cerebral infarction: Secondary | ICD-10-CM

## 2016-08-20 DIAGNOSIS — Z7902 Long term (current) use of antithrombotics/antiplatelets: Secondary | ICD-10-CM | POA: Insufficient documentation

## 2016-08-20 DIAGNOSIS — F329 Major depressive disorder, single episode, unspecified: Secondary | ICD-10-CM | POA: Insufficient documentation

## 2016-08-20 DIAGNOSIS — Z79899 Other long term (current) drug therapy: Secondary | ICD-10-CM | POA: Insufficient documentation

## 2016-08-20 DIAGNOSIS — F17210 Nicotine dependence, cigarettes, uncomplicated: Secondary | ICD-10-CM | POA: Insufficient documentation

## 2016-08-20 DIAGNOSIS — I69354 Hemiplegia and hemiparesis following cerebral infarction affecting left non-dominant side: Secondary | ICD-10-CM | POA: Insufficient documentation

## 2016-08-20 DIAGNOSIS — E785 Hyperlipidemia, unspecified: Secondary | ICD-10-CM | POA: Insufficient documentation

## 2016-08-20 DIAGNOSIS — F0631 Mood disorder due to known physiological condition with depressive features: Secondary | ICD-10-CM

## 2016-08-20 NOTE — Patient Instructions (Signed)
Botox injection next visit  We'll try to schedule psychology visit, the same day

## 2016-08-20 NOTE — Progress Notes (Signed)
Subjective:    Patient ID: Grant Garcia, male    DOB: [birth year], 51 y.o.   MRN: [id number] year old right-handed Spanish- speaking male on no prescription medication prior to admission with history of tobacco abuse.  Lives with family independent prior to admission.  Presented to [facility] with left-sided weakness.  He was transferred to [facility] for further evaluation.  CT of the head showed evolving right MCA infarct as well as emergency large vessel occlusion on the right MCA per MRA.  MRI of the brain showed patchy multifocal right MCA territory infarct.  The patient did not receive tPA.  Interventional Radiology consulted and underwent thrombectomy.  Echocardiogram with ejection fraction of 55%, no wall motion abnormalities.  Neurology consulted, maintained on Plavix and aspirin for CVA prophylaxis.  Subcutaneous Lovenox for DVT prophylaxis. DATE OF ADMISSION:  03/15/2016 DATE OF DISCHARGE:  04/04/2016  HPI  Last physical medicine and rehabilitation visit for 03/17/2016, at which time he was still requiring some assistance with bathing and dressing. Family notes that he still requires a little bit of assistance. At last visit, he was ambulating with a rolling walker, but he has now started using a hemiwalker. When he is with somebody around the house,he can ambulate without an assistive device No falls reported  Family is concerned that he has become more depressed since Celexa was discontinued. Primary care discontinued this medication Pain Inventory Average Pain 5 Pain Right Now 0 My pain is dull  In the last 24 hours, has pain interfered with the following? General activity 2 Relation with others 5 Enjoyment of life 5 What TIME of day is your pain at its worst? morning Sleep (in general) Good  Pain is worse with: inactivity Pain improves with: therapy/exercise and pacing activities Relief from Meds: 9  Mobility use a walker ability to  climb steps?  yes do you drive?  no  Function disabled: date disabled 2018  Neuro/Psych weakness numbness depression anxiety  Prior Studies Any changes since last visit?  no  Physicians involved in your care Any changes since last visit?  no   Family History  Problem Relation Age of Onset  . Stroke Maternal Uncle    Social History   Social History  . Marital status: Married    Spouse name: N/A  . Number of children: N/A  . Years of education: N/A   Social History Main Topics  . Smoking status: Former Smoker    Packs/day: 0.50    Years: 30.00    Types: Cigarettes  . Smokeless tobacco: Never Used  . Alcohol use No  . Drug use: No  . Sexual activity: Not Asked   Other Topics Concern  . None   Social History Narrative  . None   Past Surgical History:  Procedure Laterality Date  . IR GENERIC HISTORICAL  03/11/2016   IR ANGIO VERTEBRAL SEL VERTEBRAL UNI R MOD SED 03/11/2016 Gilmer MorJaime Wagner, DO [facility]-INTERV RAD  . IR GENERIC HISTORICAL  03/11/2016   IR ANGIO INTRA EXTRACRAN SEL COM CAROTID INNOMINATE UNI L MOD SED 03/11/2016 Gilmer MorJaime Wagner, DO [facility]  . IR GENERIC HISTORICAL  03/11/2016   IR PERCUTANEOUS ART THROMBECTOMY/INFUSION INTRACRANIAL INC DIAG ANGIO 03/11/2016 Gilmer MorJaime Wagner, DO [facility]  . IR GENERIC HISTORICAL  03/11/2016   IR US GUIDE VASC ACCESS RIGHT 03/11/2016 Gilmer MorJaime Wagner, DO [facility]  . IR RADIOLOGIST EVAL & MGMT  05/06/2016  . RADIOLOGY WITH ANESTHESIA N/A 03/11/2016   Procedure: RADIOLOGY WITH ANESTHESIA;  Surgeon: Medication Radiologist, MD;  Location: [facility] OR;  Service: Radiology;  Laterality: N/A;   Past Medical History:  Diagnosis Date  . Medical history non-contributory   . Stroke (HCC)    BP 124/73   Pulse 66   SpO2 96%   Opioid Risk Score:   Fall Risk Score:  `1  Depression screen PHQ 2/9  Depression screen PHQ 2/9 04/27/2016  Decreased Interest 0  Down, Depressed, Hopeless 1  PHQ - 2 Score 1  Altered sleeping 1  Tired,  decreased energy 3  Change in appetite 0  Feeling bad or failure about yourself  2  Trouble concentrating 0  Moving slowly or fidgety/restless 0  Suicidal thoughts 0  PHQ-9 Score 7  Difficult doing work/chores Somewhat difficult     Review of Systems  Constitutional: Negative.   HENT: Negative.   Eyes: Negative.   Respiratory: Negative.   Cardiovascular: Negative.   Gastrointestinal: Positive for diarrhea.  Endocrine: Negative.   Genitourinary: Negative.   Musculoskeletal: Negative.   Skin: Negative.   Allergic/Immunologic: Negative.   Neurological: Negative.   Hematological: Negative.   Psychiatric/Behavioral: Negative.   All other systems reviewed and are negative.      Objective:   Physical Exam  Constitutional: He is oriented to person, place, and time. He appears well-developed and well-nourished.  HENT:  Head: Normocephalic and atraumatic.  Eyes: Pupils are equal, round, and reactive to light. Conjunctivae and EOM are normal.  Neurological: He is alert and oriented to person, place, and time.  2 minus at the left deltoid, biceps, triceps, finger flexors, trace finger extension 4 minus at the left hip flexor, knee extensor, 2 minus, ankle dorsiflexor. Tone Ashworth grade 3 at the finger flexors and left thumb flexor. Ashworth grade 2 at the wrist flexor. Line. Ashworth grade 1 at the elbow flexor  Psychiatric: His affect is blunt. His speech is delayed. He exhibits a depressed mood.  Spanish-speaking, but son is able to translate.  Nursing note and vitals reviewed.  Ambulates with a hemiwalker. No evidence of toe drag, has blue rocker AFO.        Assessment & Plan:  1. Left spastic hemiplegia increasing tone. Left finger wrist and thumb flexor. Discussed treatment options, he is already receiving outpatient OT, PT, given his depressed mood and overall low energy would not recommend tizanidine or baclofen. We will perform Botox injection Left FPL 50 Left FDS  50 Left FDP 50 Left FCU 25 Left FCR 25  Continue outpatient therapy  2. Post stroke depression, was on Celexa which helped with his lability, primary care discontinued this and [place]. However, patient did not follow up because of distance. Will restart Celexa 10 mg per day. Referral to neuropsychology at this office, hopefully coordinate visit date with injection

## 2016-09-14 ENCOUNTER — Ambulatory Visit: Payer: Medicaid Other | Admitting: Physical Medicine & Rehabilitation

## 2016-09-29 ENCOUNTER — Encounter: Payer: Self-pay | Admitting: Neurology

## 2016-09-29 ENCOUNTER — Other Ambulatory Visit: Payer: Self-pay

## 2016-09-29 ENCOUNTER — Ambulatory Visit: Payer: Self-pay | Admitting: Neurology

## 2016-09-29 ENCOUNTER — Ambulatory Visit (INDEPENDENT_AMBULATORY_CARE_PROVIDER_SITE_OTHER): Payer: Self-pay | Admitting: Neurology

## 2016-09-29 VITALS — BP 120/74 | HR 71 | Wt 144.0 lb

## 2016-09-29 DIAGNOSIS — I63511 Cerebral infarction due to unspecified occlusion or stenosis of right middle cerebral artery: Secondary | ICD-10-CM

## 2016-09-29 DIAGNOSIS — E785 Hyperlipidemia, unspecified: Secondary | ICD-10-CM

## 2016-09-29 DIAGNOSIS — I69354 Hemiplegia and hemiparesis following cerebral infarction affecting left non-dominant side: Secondary | ICD-10-CM

## 2016-09-29 DIAGNOSIS — Z87891 Personal history of nicotine dependence: Secondary | ICD-10-CM | POA: Insufficient documentation

## 2016-09-29 NOTE — Patient Instructions (Addendum)
-   continue plavix and lipitor for stroke prevention - check BP at home periodically - Follow up with your primary care physician for stroke risk factor modification. Recommend maintain blood pressure goal <130/80, diabetes with hemoglobin A1c goal below 7.0% and lipids with LDL cholesterol goal below 70 mg/dL.  - continue abstain from smoking - continue outpt PT/OT - will do TCD bubble study to rule out PFO - follow up Dr. Wynn BankerKirsteins to reschedule for follow up - encourage activity at home and thinking about part time job that fits to your condition. Let us know if you need psychological counseling or medications for depression.  - follow up in 6 months with me.

## 2016-09-29 NOTE — Progress Notes (Signed)
STROKE NEUROLOGY FOLLOW UP NOTE  NAME: Grant Garcia DOB: 15-Feb-1965  REASON FOR VISIT: stroke follow up HISTORY FROM: Patient and wife and chart  Today we had the pleasure of seeing Grant Garcia in follow-up at our Neurology Clinic. Pt was accompanied by wife and interpretor.   History Summary Grant Garcia is a 51 y.o. male with PMH of smoking admitted on 03/11/16 for feeling weak all over. CT showed a right MCA infarct with large right penumbra. Grant Garcia did not receive IV t-PA due to delay in arrival. Taken to IR showed R MCA occlusion with good collateral flow from ACA. Mechanical thrombectomy not performed due to concerns of chronic occlusion. However, his significant weakness not consistent with chronic occlusion. MRI showed scattered small to moderate large infarcts at right MCA territory, MRA showed right M1 cut off. EF 55-60%. Recommended 30 day cardiac event monitoring as outpt. LDL 134 and A1C 5.6. Put on DAPT and lipitor 80 on discharge to CIR. BP goal 130-150  06/24/16 follow up - the patient has been doing better. Finished CIR and home therapy. Grant Garcia follows with Dr. Wynn Banker in clinic but had no show. Grant Garcia still has left UE significant weakness but LLE improved well. Has quit smoking since stroke. BP stable 117/69 today. However, his medicaid application is pending.    Interval History During the interval time, patient has been doing better. Walking with a hemiwalker now. However, still need right leg brace and still has significant deficit on the left upper extremity. Finishing PT/OT session due to reached potential. Following with Dr. Wynn Banker for Botox injection, however, patient and family still working on insurance coverage. TCD bubble study has not yet. BP today 120/74. Patient has depressed mood due to not able to work or helping family financially. However, currently refused psychological counseling or psychiatry referral  REVIEW OF SYSTEMS: Full 14 system review of  systems performed and notable only for those listed below and in HPI above, all others are negative:  Constitutional:   Cardiovascular:  Ear/Nose/Throat:   Skin:  Eyes:   Respiratory:   Gastroitestinal:   Genitourinary:  Hematology/Lymphatic:   Endocrine:  Musculoskeletal:   Allergy/Immunology:   Neurological:   Psychiatric: depression Sleep:   The following represents the patient's updated allergies and side effects list: No Known Allergies  The neurologically relevant items on the patient's problem list were reviewed on today's visit.  Neurologic Examination  A problem focused neurological exam (12 or more points of the single system neurologic examination, vital signs counts as 1 point, cranial nerves count for 8 points) was performed.  Blood pressure 120/74, pulse 71, weight 144 lb (65.3 kg).  General - Well nourished, well developed, in no apparent distress.  Ophthalmologic - Fundi not visualized due to noncooperation.  Cardiovascular - Regular rate and rhythm with no murmur.  Mental Status -  Level of arousal and orientation to time, place, and person were intact. Language including expression, naming, repetition, comprehension was assessed and found intact. Fund of Knowledge was assessed and was intact.  Cranial Nerves II - XII - II - Visual field intact OU. III, IV, VI - Extraocular movements intact. V - Facial sensation intact bilaterally. VII - left facial droop. VIII - Hearing & vestibular intact bilaterally. X - Palate elevates symmetrically. XI - Chin turning & shoulder shrug intact bilaterally. XII - Tongue protrusion midline  Motor Strength - The patient's strength was normal in RUE and RLE, however, LUE deltoid 1/5, bicep 3/5, tricep 3-/5, finger  grip 1/5, LLE 4/5 proximal, 3/5 knee extension and 0/5 DF. LLE brace. and pronator drift was absent.  Bulk was normal and fasciculations were absent.   Motor Tone - Muscle tone was assessed at the neck and  appendages and was increased at LUE.  Reflexes - The patient's reflexes were 1+ in all extremities and Grant Garcia had no pathological reflexes.  Sensory - Light touch, temperature/pinprick were assessed and were normal.    Coordination - The patient had normal movements in the right hand with no ataxia or dysmetria.  Tremor was absent.  Gait and Station - able to walk with a hemiwalker but left significant hemiparetic gait.   Data reviewed: I personally reviewed the images and agree with the radiology interpretations.  Ct Head Code Stroke Wo Contrast` 03/11/2016 1. Stable noncontrast CT appearance of the brain since 0544 hours today: Hypodensity in the right lentiform nucleus, but no other CT changes of right MCA infarct. No acute intracranial hemorrhage. 2. ASPECTS is 9.    Ct Cerebral Perfusion W Contrast 03/11/2016 Large area of right MCA territory penumbra. Clinically suspected small volume core infarct in the right lentiform not detected. Overall very favorable CTP characteristics for endovascular reperfusion. I am advised that they are prepping for Neurointervention at the time of this dictation. Electronically Signed   By: Odessa FlemingH  Hall M.D.   On: 03/11/2016 09:27   Cerebral angio 03/11/2016 Status post cerebral angiogram with attempted mechanical thrombectomy of right MCA occlusion. After 2 passes with local aspiration and stentreiver technology, there is persistent occlusion and TICI 1 flow. Excellent collateral flow perfusing the right MCA territory. My impression is that there is likely underlying stenosis at the site of occlusion given the collateral flow, and that further manipulation may cause dissection or hemorrhage. Given the excellent collateral flow and absence of core infarction on the perfusion imaging, we elected to defer further attempts at thrombectomy. Deployment of Exoseal for hemostasis.   Ct Head Wo Contrast 03/11/2016 Stable postprocedural CT. No progression of the  lenticulostriate infarct and no acute hemorrhage.   Mr Brain Wo Contrast 03/12/2016 Acute patchy multifocal RIGHT MCA territory infarct. No hemorrhage.   Mr Maxine GlennMra Head/brain Wo Cm 03/12/2016 Moderately motion degraded examination. RIGHT M1 occlusion, no collateral vessels by time-of-flight technique.   TTE - Left ventricle: The cavity size was normal. Systolic function was normal. The estimated ejection fraction was in the range of 55% to 60%. Wall motion was normal; there were no regional wall motion abnormalities. Marked bradycardia (44 bpm) limits evaluation of LV diastolic function.  Component     Latest Ref Rng & Units 03/12/2016  Cholesterol     0 - 200 mg/dL 161190  Triglycerides     <150 mg/dL 096118  HDL Cholesterol     >40 mg/dL 32 (L)  Total CHOL/HDL Ratio     RATIO 5.9  VLDL     0 - 40 mg/dL 24  LDL (calc)     0 - 99 mg/dL 045134 (H)  Hemoglobin [id number]     4.8 - 5.6 % 5.6  Mean Plasma Glucose     mg/dL 811114    Assessment: As you may recall, Grant Garcia is a 51 y.o. Hispanic male with PMH of smoking admitted on 03/11/16 for right MCA infarct with large right penumbra. DSA showed R MCA occlusion with good collateral flow from ACA. Mechanical thrombectomy not performed due to concerns of chronic occlusion. However, his significant weakness not consistent with chronic occlusion. MRI showed scattered  small to moderate large infarcts at right MCA territory, MRA showed right M1 cut off. EF 55-60%. Recommended 30 day cardiac event monitoring as outpt. LDL 134 and A1C 5.6. Put on DAPT and lipitor 80 on discharge to CIR. BP goal 130-150. Grant Garcia still has left UE significant weakness but LLE improved well. Has quit smoking since stroke. Will do cardiac monitoring and sleep study once medical insurance available. Has outpt PT/OT and TCD bubble study not done yet. Finished DAPT for 3 months, will d/c ASA.  Etiology of stroke not clear, patient is young, lack of significant stroke risk factors (does  have HLD, smoker), needs to rule out PFO.  Plan:  - continue plavix and lipitor for stroke prevention - check BP at home periodically - Follow up with your primary care physician for stroke risk factor modification. Recommend maintain blood pressure goal <130/80, diabetes with hemoglobin A1c goal below 7.0% and lipids with LDL cholesterol goal below 70 mg/dL.  - continue abstain from smoking - continue outpt PT/OT - will consider 30 day cardiac monitoring and sleep study once Medicaid approved.  - will do TCD bubble study to rule out PFO - follow up Dr. Wynn Banker to reschedule for follow up - encourage activity at home and thinking about part time job that fits to your condition. Let us know if you need psychological counseling or medications for depression.  - follow up in 6 months with me.  I spent more than 25 minutes of face to face time with the patient. Greater than 50% of time was spent in counseling and coordination of care. We discussed about outpt PT/OT, further stroke work up, follow up with PMR, and working on Community education officer.    No orders of the defined types were placed in this encounter.   No orders of the defined types were placed in this encounter.   Patient Instructions  - continue plavix and lipitor for stroke prevention - check BP at home periodically - Follow up with your primary care physician for stroke risk factor modification. Recommend maintain blood pressure goal <130/80, diabetes with hemoglobin A1c goal below 7.0% and lipids with LDL cholesterol goal below 70 mg/dL.  - continue abstain from smoking - continue outpt PT/OT - will do TCD bubble study to rule out PFO - follow up Dr. Wynn Banker to reschedule for follow up - encourage activity at home and thinking about part time job that fits to your condition. Let us know if you need psychological counseling or medications for depression.  - follow up in 6 months with me.   Marvel Plan, MD PhD [facility] [address], [address] [place], [place] [address] [phone]

## 2016-09-30 ENCOUNTER — Ambulatory Visit (HOSPITAL_COMMUNITY)
Admission: RE | Admit: 2016-09-30 | Discharge: 2016-09-30 | Disposition: A | Discharge status: Home / Self Care | Payer: Self-pay | Source: Ambulatory Visit | Attending: Neurology | Admitting: Neurology

## 2016-09-30 DIAGNOSIS — I63511 Cerebral infarction due to unspecified occlusion or stenosis of right middle cerebral artery: Secondary | ICD-10-CM | POA: Insufficient documentation

## 2016-09-30 NOTE — Progress Notes (Signed)
VASCULAR LAB PRELIMINARY  PRELIMINARY  PRELIMINARY  PRELIMINARY  Transcranial Doppler with bubbles completed.    Preliminary report:  No evidence of HITS. No evidence of PFO  Shelena Castelluccio, RVS 09/30/2016, 4:30 PM

## 2016-10-01 ENCOUNTER — Telehealth: Payer: Self-pay

## 2016-10-01 NOTE — Telephone Encounter (Signed)
-----   Message from Marvel PlanJindong Xu, MD sent at 09/30/2016  6:23 PM EDT ----- Could you please let the patient know that the head ultrasound test done today showed no hole in heart. No further test at this time. Please continue current treatment. Thanks.  Marvel PlanJindong Xu, MD PhD Stroke Neurology 09/30/2016 6:23 PM

## 2016-10-01 NOTE — Telephone Encounter (Signed)
Left vm on patients daughter number about the bubble ultrasound results. Pt speaks limited english. Rn requested a call back to give results. ------

## 2016-10-05 ENCOUNTER — Ambulatory Visit: Payer: Medicaid Other | Admitting: Psychology

## 2016-10-05 ENCOUNTER — Encounter: Payer: Self-pay | Attending: Physical Medicine & Rehabilitation | Admitting: Psychology

## 2016-10-05 DIAGNOSIS — Z7902 Long term (current) use of antithrombotics/antiplatelets: Secondary | ICD-10-CM | POA: Insufficient documentation

## 2016-10-05 DIAGNOSIS — Z79899 Other long term (current) drug therapy: Secondary | ICD-10-CM | POA: Insufficient documentation

## 2016-10-05 DIAGNOSIS — F17210 Nicotine dependence, cigarettes, uncomplicated: Secondary | ICD-10-CM | POA: Insufficient documentation

## 2016-10-05 DIAGNOSIS — I69354 Hemiplegia and hemiparesis following cerebral infarction affecting left non-dominant side: Secondary | ICD-10-CM | POA: Insufficient documentation

## 2016-10-05 DIAGNOSIS — F329 Major depressive disorder, single episode, unspecified: Secondary | ICD-10-CM | POA: Insufficient documentation

## 2016-10-05 DIAGNOSIS — E785 Hyperlipidemia, unspecified: Secondary | ICD-10-CM | POA: Insufficient documentation

## 2016-10-06 NOTE — Telephone Encounter (Signed)
Advised patient's daughter of below results per Dr. Warren DanesXu's nurse Katrina. She said thank you.

## 2016-11-02 ENCOUNTER — Encounter: Payer: Self-pay | Admitting: Physical Medicine & Rehabilitation

## 2016-11-02 ENCOUNTER — Encounter: Payer: Self-pay | Attending: Physical Medicine & Rehabilitation

## 2016-11-02 ENCOUNTER — Ambulatory Visit (HOSPITAL_BASED_OUTPATIENT_CLINIC_OR_DEPARTMENT_OTHER): Payer: Medicaid Other | Admitting: Physical Medicine & Rehabilitation

## 2016-11-02 VITALS — BP 136/82 | HR 63 | Resp 14

## 2016-11-02 DIAGNOSIS — I69354 Hemiplegia and hemiparesis following cerebral infarction affecting left non-dominant side: Secondary | ICD-10-CM | POA: Insufficient documentation

## 2016-11-02 DIAGNOSIS — F17210 Nicotine dependence, cigarettes, uncomplicated: Secondary | ICD-10-CM | POA: Insufficient documentation

## 2016-11-02 DIAGNOSIS — F329 Major depressive disorder, single episode, unspecified: Secondary | ICD-10-CM | POA: Insufficient documentation

## 2016-11-02 DIAGNOSIS — Z79899 Other long term (current) drug therapy: Secondary | ICD-10-CM | POA: Insufficient documentation

## 2016-11-02 DIAGNOSIS — Z7902 Long term (current) use of antithrombotics/antiplatelets: Secondary | ICD-10-CM | POA: Insufficient documentation

## 2016-11-02 DIAGNOSIS — E785 Hyperlipidemia, unspecified: Secondary | ICD-10-CM | POA: Insufficient documentation

## 2016-11-02 DIAGNOSIS — G8114 Spastic hemiplegia affecting left nondominant side: Secondary | ICD-10-CM

## 2016-11-02 NOTE — Progress Notes (Signed)
Botox Injection for spasticity using needle EMG guidance  Dilution: 50 Units/ml Indication: Severe spasticity which interferes with ADL,mobility and/or  hygiene and is unresponsive to medication management and other conservative care Informed consent was obtained after describing risks and benefits of the procedure with the patient. This includes bleeding, bruising, infection, excessive weakness, or medication side effects. A REMS form is on file and signed. Needle: 27g 1" needle electrode Number of units per muscle Left FPL 50 Left FDS 50 Left FDP 50  Left FCR 50 All injections were done after obtaining appropriate EMG activity and after negative drawback for blood. The patient tolerated the procedure well. Post procedure instructions were given. A followup appointment was made.

## 2016-11-02 NOTE — Patient Instructions (Addendum)

## 2016-12-14 ENCOUNTER — Ambulatory Visit: Payer: Self-pay | Admitting: Physical Medicine & Rehabilitation

## 2016-12-14 ENCOUNTER — Encounter: Payer: Self-pay | Attending: Physical Medicine & Rehabilitation

## 2016-12-14 DIAGNOSIS — F329 Major depressive disorder, single episode, unspecified: Secondary | ICD-10-CM | POA: Insufficient documentation

## 2016-12-14 DIAGNOSIS — Z79899 Other long term (current) drug therapy: Secondary | ICD-10-CM | POA: Insufficient documentation

## 2016-12-14 DIAGNOSIS — E785 Hyperlipidemia, unspecified: Secondary | ICD-10-CM | POA: Insufficient documentation

## 2016-12-14 DIAGNOSIS — I69354 Hemiplegia and hemiparesis following cerebral infarction affecting left non-dominant side: Secondary | ICD-10-CM | POA: Insufficient documentation

## 2016-12-14 DIAGNOSIS — Z7902 Long term (current) use of antithrombotics/antiplatelets: Secondary | ICD-10-CM | POA: Insufficient documentation

## 2016-12-14 DIAGNOSIS — F17210 Nicotine dependence, cigarettes, uncomplicated: Secondary | ICD-10-CM | POA: Insufficient documentation

## 2017-03-29 ENCOUNTER — Ambulatory Visit: Payer: Self-pay | Admitting: Neurology

## 2017-03-30 ENCOUNTER — Encounter: Payer: Self-pay | Admitting: Neurology

## 2017-03-30 ENCOUNTER — Telehealth: Payer: Self-pay

## 2017-03-30 NOTE — Telephone Encounter (Signed)
Patient no show for appt today on 03/29/2017.

## 2018-01-15 IMAGING — MR MR HEAD W/O CM
7 series · 29 of 48 positions shown · non-contrast
Comparison: CT HEAD March 11, 2016

CLINICAL DATA: Known RIGHT M1 occlusion, follow-up stroke.

EXAM:
MRI HEAD WITHOUT CONTRAST
MRA HEAD WITHOUT CONTRAST
TECHNIQUE: Multiplanar, multiecho pulse sequences of the brain and surrounding
structures were obtained without intravenous contrast. Angiographic
images of the head were obtained using MRA technique without
contrast. Patient was unable to tolerate further imaging, coronal T2
not seen.

[Series 4: DWI · axial · 3.0mm · 0.94mm/px · z∈[-45,+102]mm · 11 of 100 slices shown]
[im 1/100]
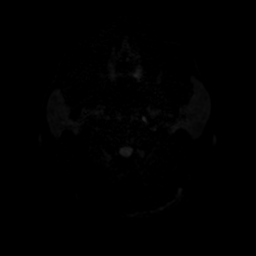
[im 10/100]
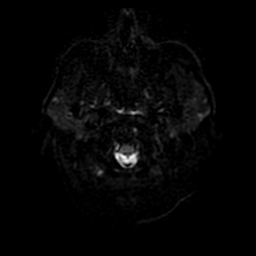
[im 20/100]
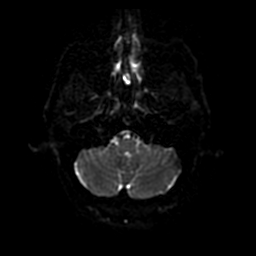
[im 30/100]
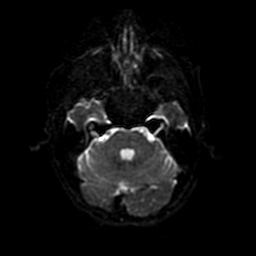
[im 40/100]
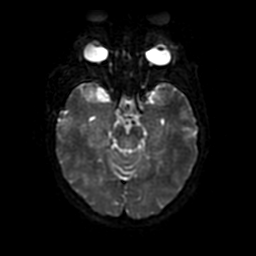
[im 50/100]
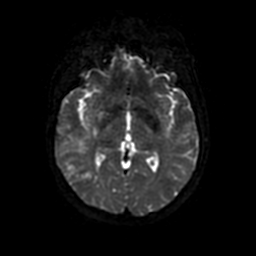
[im 60/100]
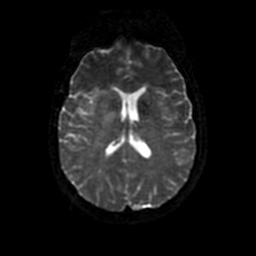
[im 70/100]
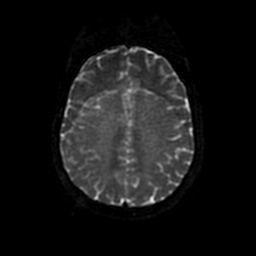
[im 80/100]
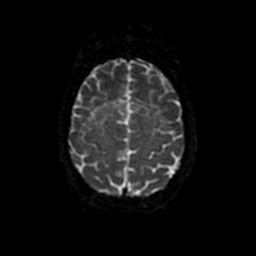
[im 90/100]
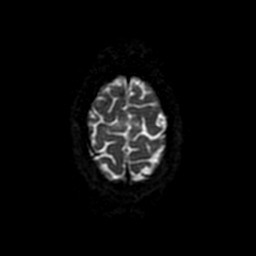
[im 100/100]
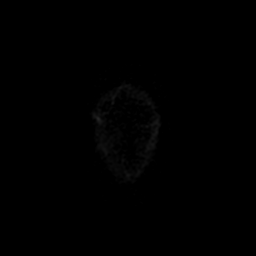

[Series 5: ax (id) 2 · axial · 1.0mm · 0.43mm/px · 1 of 184 slices shown]
[im 10/184]
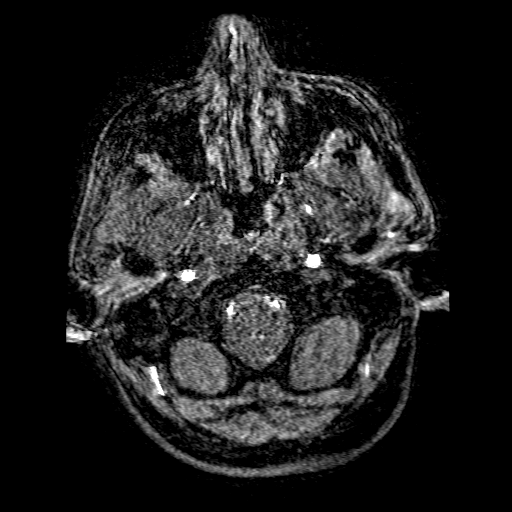

[Series 6: FLAIR · sagittal · 5.0mm · 0.47mm/px · 3 of 23 slices shown (1 of 2)]
[im 1/23]
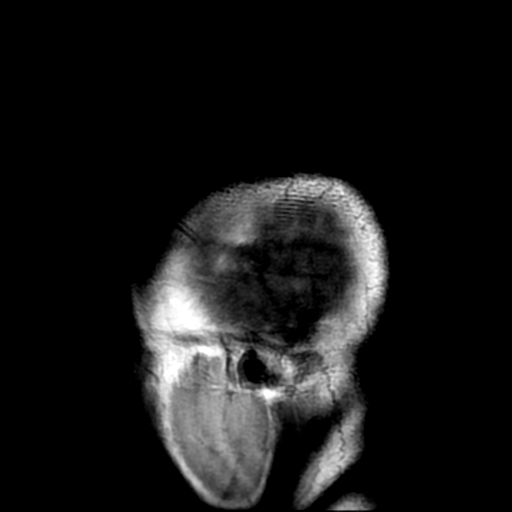
[im 12/23]
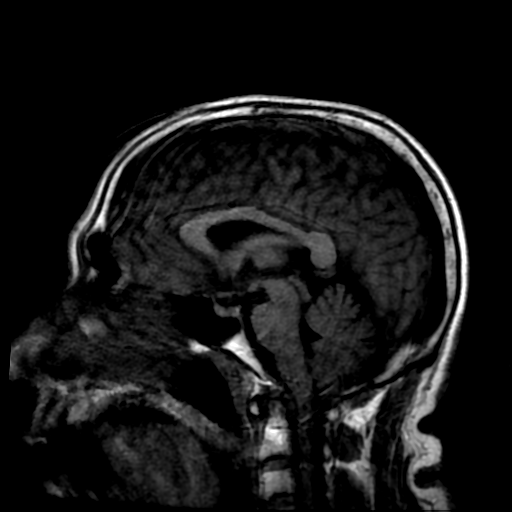
[im 23/23]
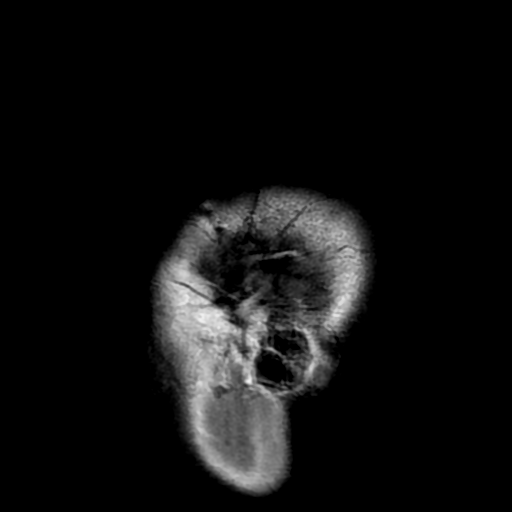

[Series 8: T2 · axial · 5.0mm · 0.43mm/px · z∈[-44,+100]mm · 3 of 25 slices shown]
[im 1/25]
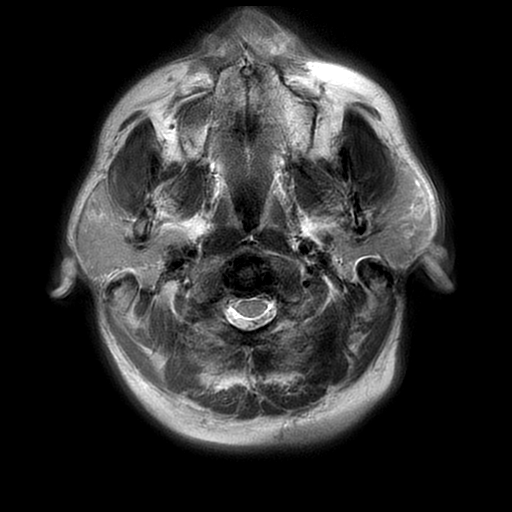
[im 13/25]
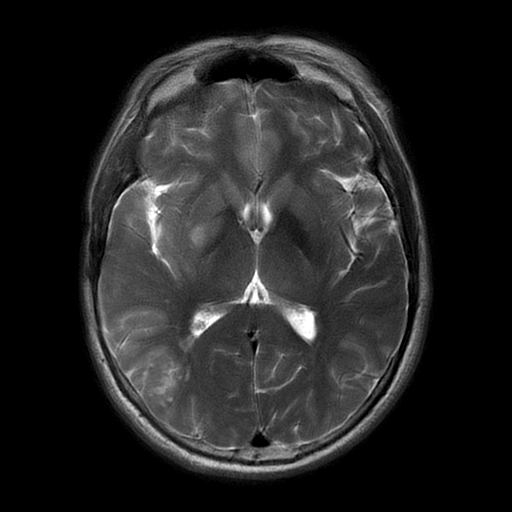
[im 25/25]
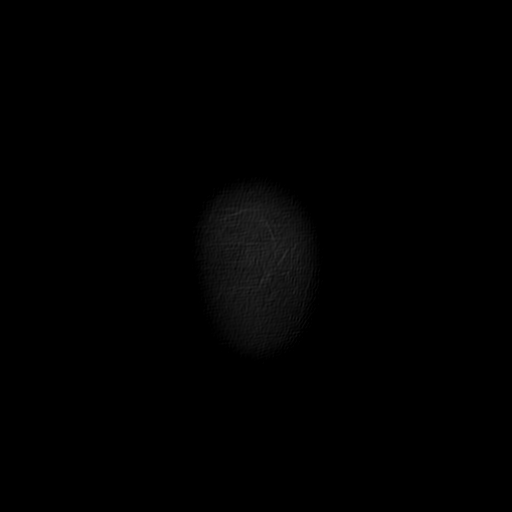

[Series 13: FLAIR · axial · 5.0mm · 0.43mm/px · z∈[-75,+69]mm · 3 of 25 slices shown (2 of 2)]
[im 1/25]
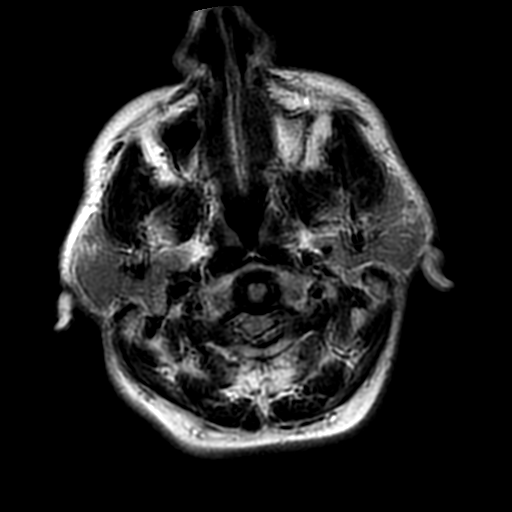
[im 13/25]
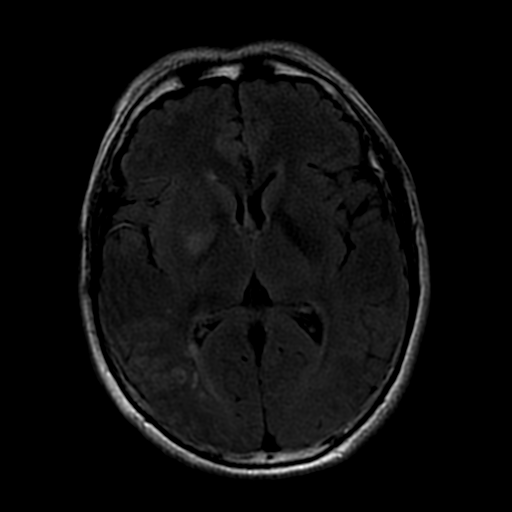
[im 25/25]
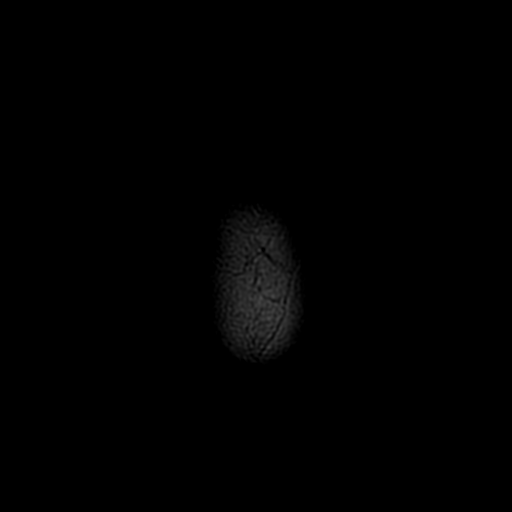

[Series 14: GRE · axial · 5.0mm · 0.43mm/px · z∈[-85,+58]mm · 3 of 25 slices shown]
[im 1/25]
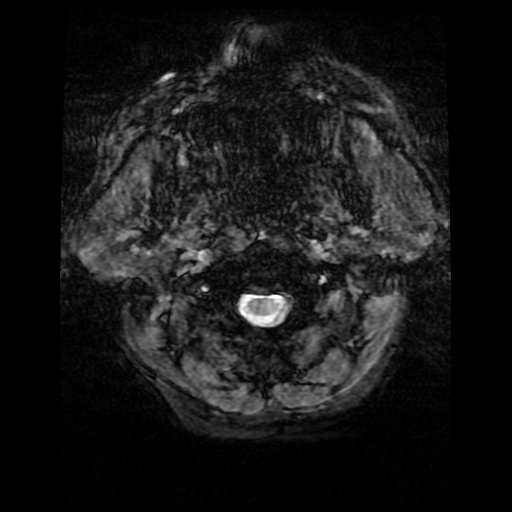
[im 13/25]
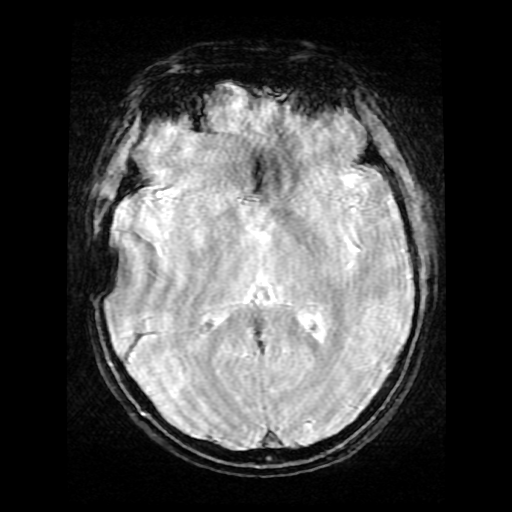
[im 25/25]
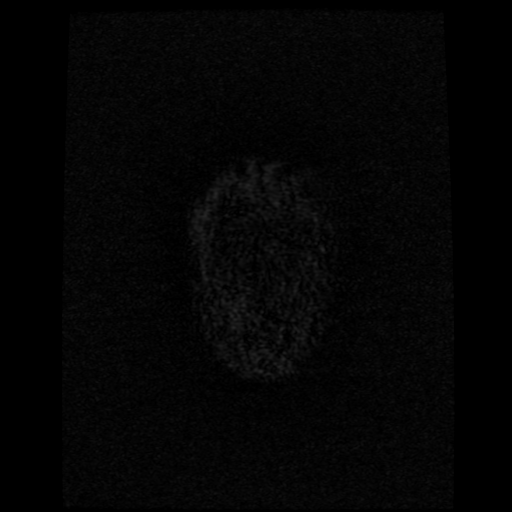

[Series 450: ADC · axial · 3.0mm · 0.94mm/px · z∈[-45,+102]mm · 5 of 49 slices shown]
[im 1/49]
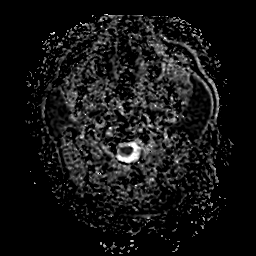
[im 13/49]
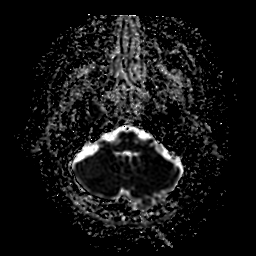
[im 25/49]
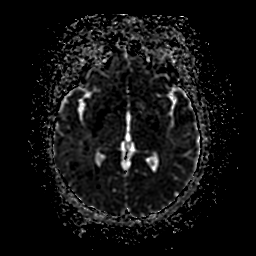
[im 37/49]
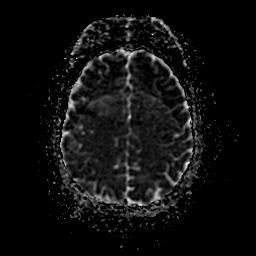
[im 49/49]
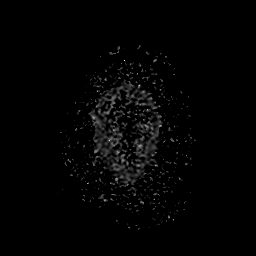

[29 of 48 positions shown; findings below may reference images not displayed]

FINDINGS: MRI HEAD FINDINGS- mildly motion degraded examination.

BRAIN: Patchy reduced diffusion RIGHT basal ganglia, RIGHT
frontotemporal parietal lobes with corresponding low ADC values
faint clear T2 hyperintense signal. No susceptibility artifact to
suggest hemorrhage. No susceptibility artifact to suggest
hemorrhage. The ventricles and sulci are normal for patient's age.
No suspicious parenchymal signal, masses or mass effect. No abnormal
extra-axial fluid collections.

VASCULAR: Normal major intracranial vascular flow voids present at
skull base.

SKULL AND UPPER CERVICAL SPINE: No abnormal sellar expansion. No
suspicious calvarial bone marrow signal. Craniocervical junction
maintained.

SINUSES/ORBITS: The mastoid air-cells and included paranasal sinuses
are well-aerated. The included ocular globes and orbital contents
are non-suspicious.

OTHER: None.

MRA HEAD FINDINGS- moderately motion degraded

ANTERIOR CIRCULATION: Flow related enhancement bilateral internal
carotid arteries, apparent mild narrowing RIGHT cavernous internal
carotid artery, the vessel is patent on prior CT and this is
attributable to calcific atherosclerosis. Chronically occluded
proximal RIGHT M1 segments, no collateral vessels identified on
time-of-flight MRA. LEFT middle cerebral artery and bilateral
anterior cerebral arteries are patent.

POSTERIOR CIRCULATION: Bilateral vertebral arteries, basilar artery
are patent. Limited assessment of the main branch vessels due to
patient motion. Patent bilateral posterior cerebral arteries. Small
RIGHT posterior communicating artery present as seen on prior CTA.

ANATOMIC VARIANTS: None.
IMPRESSION: MRI HEAD: Acute patchy multifocal RIGHT MCA territory infarct. No
hemorrhage.

MRA HEAD: Moderately motion degraded examination. RIGHT M1
occlusion, no collateral vessels by time-of-flight technique.
